# Patient Record
Sex: Female | Born: 1945 | Race: White | Hispanic: No | Marital: Married | Smoking: Never smoker
Health system: Southern US, Community
[De-identification: ages and names within clinical notes are randomized; demographics above are authoritative.]

## PROBLEM LIST (undated history)

## (undated) DIAGNOSIS — E7800 Pure hypercholesterolemia, unspecified: Secondary | ICD-10-CM

## (undated) DIAGNOSIS — E079 Disorder of thyroid, unspecified: Secondary | ICD-10-CM

## (undated) DIAGNOSIS — G249 Dystonia, unspecified: Secondary | ICD-10-CM

## (undated) DIAGNOSIS — I10 Essential (primary) hypertension: Secondary | ICD-10-CM

## (undated) HISTORY — PX: TUBAL LIGATION: SHX77

---

## 2017-03-24 ENCOUNTER — Other Ambulatory Visit (HOSPITAL_COMMUNITY): Payer: Self-pay | Admitting: Interventional Radiology

## 2017-03-24 DIAGNOSIS — S32010A Wedge compression fracture of first lumbar vertebra, initial encounter for closed fracture: Secondary | ICD-10-CM

## 2017-03-26 ENCOUNTER — Other Ambulatory Visit: Payer: Self-pay | Admitting: General Surgery

## 2017-03-26 ENCOUNTER — Other Ambulatory Visit: Payer: Self-pay | Admitting: Physician Assistant

## 2017-04-20 ENCOUNTER — Telehealth: Payer: Self-pay | Admitting: *Deleted

## 2017-04-20 NOTE — Telephone Encounter (Signed)
Ms. Debra Morgan called and cancelled her f/u with Dr. Grace IsaacWatts. She states she is feeling good and doesn't want to come for f/u./vm

## 2017-04-28 ENCOUNTER — Other Ambulatory Visit: Payer: Self-pay

## 2018-11-11 ENCOUNTER — Emergency Department (HOSPITAL_COMMUNITY): Payer: Medicare Other

## 2018-11-11 ENCOUNTER — Emergency Department (HOSPITAL_COMMUNITY)
Admission: EM | Admit: 2018-11-11 | Discharge: 2018-11-11 | Disposition: A | Discharge status: Home / Self Care | Payer: Medicare Other | Attending: Emergency Medicine | Admitting: Emergency Medicine

## 2018-11-11 ENCOUNTER — Encounter (HOSPITAL_COMMUNITY): Payer: Self-pay | Admitting: Emergency Medicine

## 2018-11-11 ENCOUNTER — Other Ambulatory Visit: Payer: Self-pay

## 2018-11-11 DIAGNOSIS — Z20828 Contact with and (suspected) exposure to other viral communicable diseases: Secondary | ICD-10-CM | POA: Diagnosis not present

## 2018-11-11 DIAGNOSIS — I10 Essential (primary) hypertension: Secondary | ICD-10-CM | POA: Insufficient documentation

## 2018-11-11 DIAGNOSIS — Z79899 Other long term (current) drug therapy: Secondary | ICD-10-CM | POA: Diagnosis not present

## 2018-11-11 DIAGNOSIS — G249 Dystonia, unspecified: Secondary | ICD-10-CM | POA: Insufficient documentation

## 2018-11-11 DIAGNOSIS — R05 Cough: Secondary | ICD-10-CM | POA: Diagnosis not present

## 2018-11-11 DIAGNOSIS — E079 Disorder of thyroid, unspecified: Secondary | ICD-10-CM | POA: Insufficient documentation

## 2018-11-11 DIAGNOSIS — R0602 Shortness of breath: Secondary | ICD-10-CM | POA: Insufficient documentation

## 2018-11-11 HISTORY — DX: Disorder of thyroid, unspecified: E07.9

## 2018-11-11 HISTORY — DX: Dystonia, unspecified: G24.9

## 2018-11-11 HISTORY — DX: Essential (primary) hypertension: I10

## 2018-11-11 HISTORY — DX: Pure hypercholesterolemia, unspecified: E78.00

## 2018-11-11 LAB — COMPREHENSIVE METABOLIC PANEL
ALT: 15 U/L (ref 0–44)
AST: 20 U/L (ref 15–41)
Albumin: 3.8 g/dL (ref 3.5–5.0)
Alkaline Phosphatase: 70 U/L (ref 38–126)
Anion gap: 11 (ref 5–15)
BUN: 23 mg/dL (ref 8–23)
CO2: 25 mmol/L (ref 22–32)
Calcium: 9.7 mg/dL (ref 8.9–10.3)
Chloride: 103 mmol/L (ref 98–111)
Creatinine, Ser: 0.97 mg/dL (ref 0.44–1.00)
GFR calc Af Amer: >60 mL/min (ref >60)
GFR calc non Af Amer: 58 mL/min — ABNORMAL LOW (ref >60)
Glucose, Bld: 118 mg/dL — ABNORMAL HIGH (ref 70–99)
Potassium: 3.7 mmol/L (ref 3.5–5.1)
Sodium: 139 mmol/L (ref 135–145)
Total Bilirubin: 1 mg/dL (ref 0.3–1.2)
Total Protein: 6.8 g/dL (ref 6.5–8.1)

## 2018-11-11 LAB — CBC WITH DIFFERENTIAL/PLATELET
Abs Immature Granulocytes: 0.02 10*3/uL (ref 0.00–0.07)
Basophils Absolute: 0 10*3/uL (ref 0.0–0.1)
Basophils Relative: 1 %
Eosinophils Absolute: 0.1 10*3/uL (ref 0.0–0.5)
Eosinophils Relative: 2 %
HCT: 43.4 % (ref 36.0–46.0)
Hemoglobin: 14 g/dL (ref 12.0–15.0)
Immature Granulocytes: 0 %
Lymphocytes Relative: 33 %
Lymphs Abs: 1.9 10*3/uL (ref 0.7–4.0)
MCH: 31.3 pg (ref 26.0–34.0)
MCHC: 32.3 g/dL (ref 30.0–36.0)
MCV: 97.1 fL (ref 80.0–100.0)
Monocytes Absolute: 0.4 10*3/uL (ref 0.1–1.0)
Monocytes Relative: 7 %
Neutro Abs: 3.2 10*3/uL (ref 1.7–7.7)
Neutrophils Relative %: 57 %
Platelets: 277 10*3/uL (ref 150–400)
RBC: 4.47 MIL/uL (ref 3.87–5.11)
RDW: 12.2 % (ref 11.5–15.5)
WBC: 5.6 10*3/uL (ref 4.0–10.5)
nRBC: 0 % (ref 0.0–0.2)

## 2018-11-11 LAB — POCT I-STAT 7, (LYTES, BLD GAS, ICA,H+H)
Acid-Base Excess: 1 mmol/L (ref 0.0–2.0)
Bicarbonate: 25.8 mmol/L (ref 20.0–28.0)
Calcium, Ion: 1.31 mmol/L (ref 1.15–1.40)
HCT: 40 % (ref 36.0–46.0)
Hemoglobin: 13.6 g/dL (ref 12.0–15.0)
O2 Saturation: 96 %
Potassium: 3.4 mmol/L — ABNORMAL LOW (ref 3.5–5.1)
Sodium: 139 mmol/L (ref 135–145)
TCO2: 27 mmol/L (ref 22–32)
pCO2 arterial: 39.8 mmHg (ref 32.0–48.0)
pH, Arterial: 7.42 (ref 7.350–7.450)
pO2, Arterial: 77 mmHg — ABNORMAL LOW (ref 83.0–108.0)

## 2018-11-11 LAB — BRAIN NATRIURETIC PEPTIDE: B Natriuretic Peptide: 26.6 pg/mL (ref 0.0–100.0)

## 2018-11-11 IMAGING — DX PORTABLE CHEST - 1 VIEW
1 series · 1 of 1 positions shown · non-contrast
Comparison: [DATE]

CLINICAL DATA: Body aches, weakness

EXAM:
PORTABLE CHEST 1 VIEW

[chest ap]
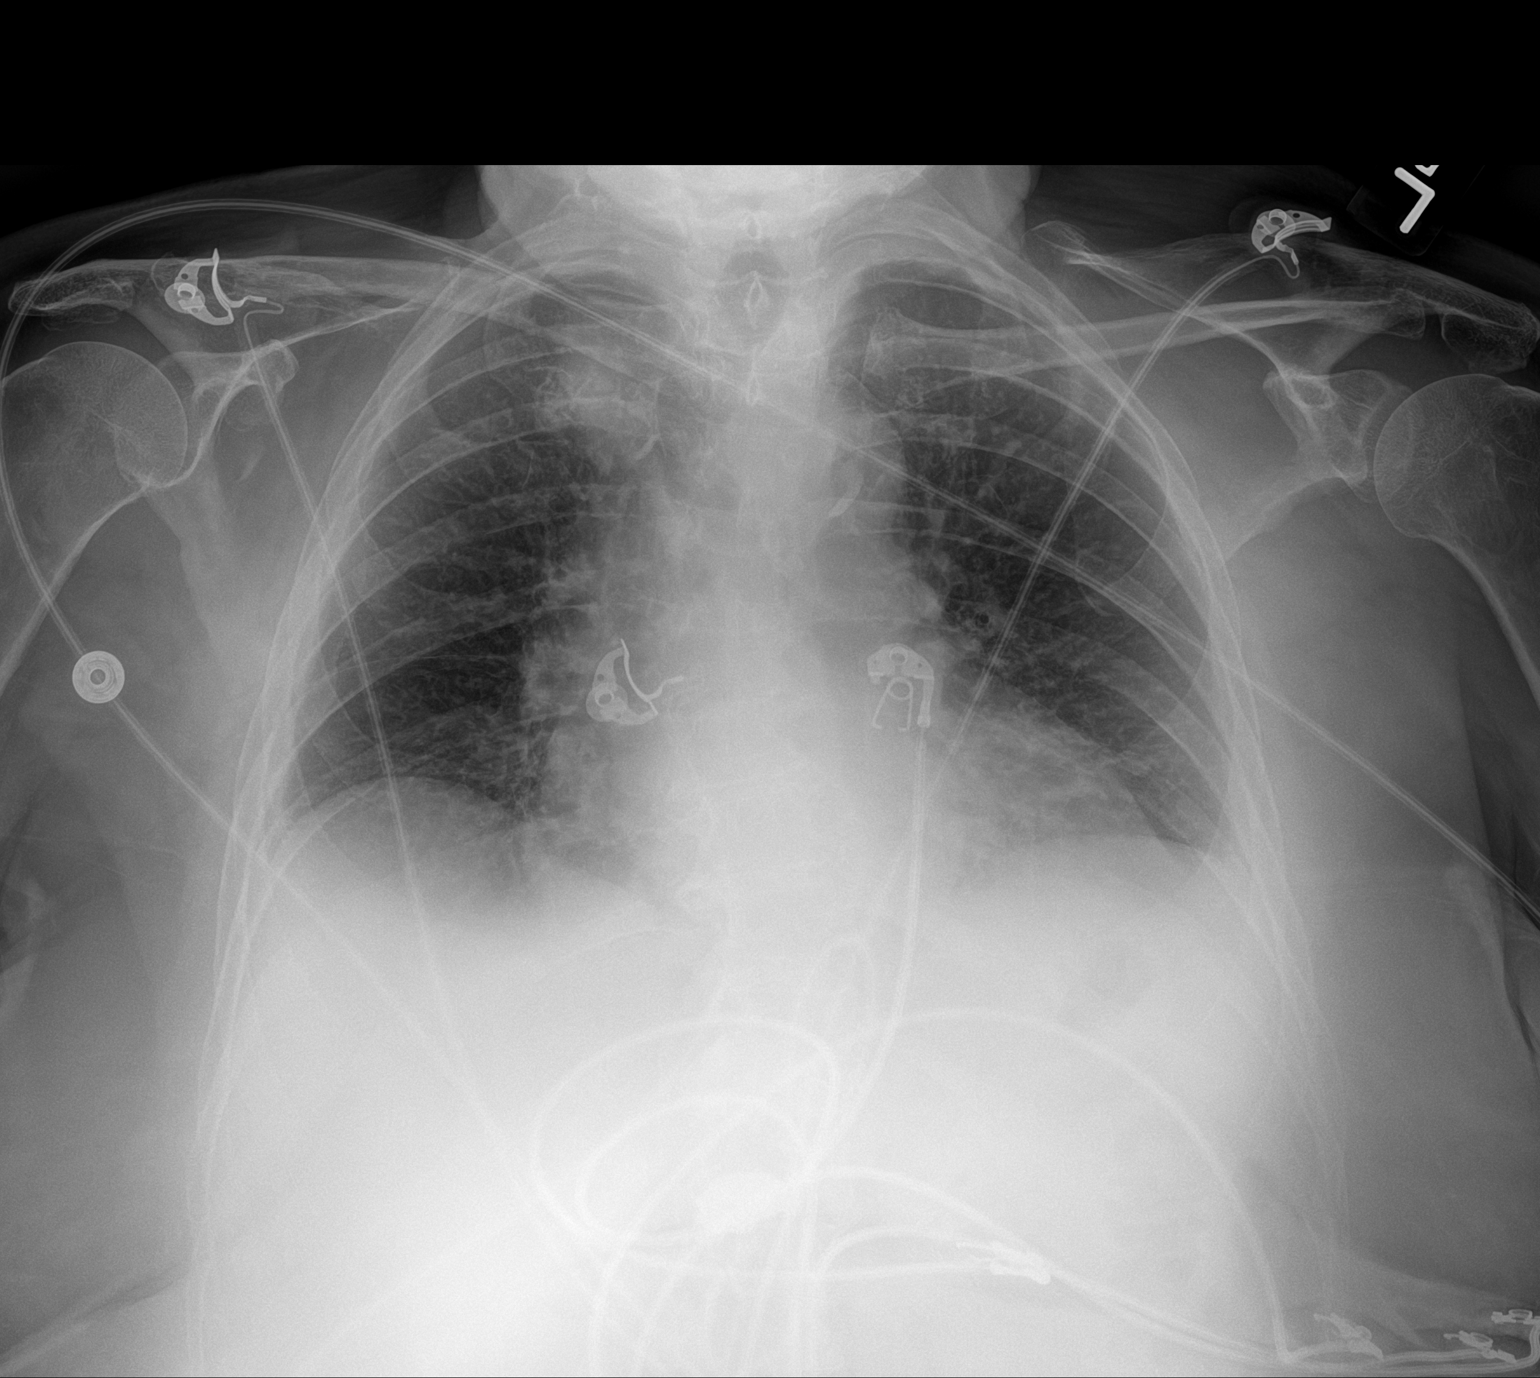

[1 of 1 positions shown; findings below may reference images not displayed]

FINDINGS: Low volume AP portable examination with minimal diffuse interstitial
pulmonary opacity, which may reflect minimal edema, this appearance
likely exaggerated by bronchovascular crowding and low volume
technique. There is no definite or focal airspace opacity. Unchanged
cardiomegaly.
IMPRESSION: Low volume AP portable examination with minimal diffuse interstitial
pulmonary opacity, which may reflect minimal edema, this appearance
likely exaggerated by bronchovascular crowding and low volume
technique. There is no definite or focal airspace opacity. Unchanged
cardiomegaly.

## 2018-11-11 MED ORDER — HYDROXYZINE HCL 10 MG PO TABS: 10.0000 mg | ORAL_TABLET | Freq: Three times a day (TID) | ORAL | 0 refills | Status: DC | PRN

## 2018-11-11 NOTE — ED Provider Notes (Signed)
[facility] EMERGENCY DEPARTMENT Provider Note   CSN: [id number] Arrival date & time: 11/11/18  7619    History   Chief Complaint Chief Complaint  Patient presents with  . Shortness of Breath    HPI Debra Morgan is a 73 y.o. female.     The history is provided by the patient, medical records, the EMS personnel and the spouse. No language interpreter was used.  Shortness of Breath  Debra Morgan is a 73 y.o. female who presents to the Emergency Department complaining of SOB. Since the emergency department complaining of 2 1/2 weeks of progressive shortness of breath. She did have a trip to [facility] where she owns a home a few weeks ago. After returning from the trip she did have a COVID test that was negative. She complains of constant shortness of breath, which is worse with exertion. She has a cough that is productive of yellow sputum occasionally. She has [place]CP [place] She is recently had CT scan and labs performed that showed no acute changes. She presents to the ED today by EMS due to persistent shortness of breath, fatigue and malaise. 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 She transfers to a bedside commode. She denies any fevers, chest pain, abdominal pain, vomiting, diarrhea. Symptoms are moderate, constant, worsening. Past Medical History:  Diagnosis Date  . Dystonia   . High cholesterol   . Hypertension   . Thyroid disease     There are no active problems to display for this patient.      OB History   No obstetric history on file.      Home Medications    Prior to Admission medications   Medication Sig Start Date End Date Taking? Authorizing Provider  acetaminophen (TYLENOL) 500 MG tablet Take 1,000 mg by mouth every 6 (six) hours as needed for mild pain or headache.   Yes [provider]  candesartan (ATACAND) 4 MG tablet Take 4 mg by mouth daily. 08/26/18  Yes [provider]  carvedilol (COREG) 6.25 MG tablet Take 6.25 mg by mouth 2 (two) times a day. 09/13/18  Yes [provider]  doxycycline (VIBRAMYCIN) 100 MG capsule Take 100 mg by mouth 2 (two) times a day. 11/05/18  Yes [provider]  escitalopram (LEXAPRO) 10 MG tablet Take 10 mg by mouth daily. 08/24/18  Yes [provider]  ezetimibe (ZETIA) 10 MG tablet Take 10 mg by mouth at bedtime. 09/21/18  Yes [provider]  levothyroxine (SYNTHROID) 75 MCG tablet Take 75 mcg by mouth daily. 08/10/18  Yes [provider]  triamterene-hydrochlorothiazide (MAXZIDE-25) 37.5-25 MG tablet Take 1 tablet by mouth daily. 09/13/18  Yes [provider]  hydrOXYzine (ATARAX/VISTARIL) 10 MG tablet Take 1 tablet (10 mg total) by mouth every 8 (eight) hours as needed for anxiety. 11/11/18   Quintella Reichert, MD  LIVALO 2 MG TABS Take 2 mg by mouth daily. 05/20/18   [provider]    Family History No family history on file.  Social History Social History   Tobacco Use  . Smoking status: Never Smoker  . Smokeless tobacco: Never Used  Substance Use Topics  . Alcohol use: Yes    Comment: social drinker, infrequent  . Drug use: Never     Allergies   Sinemet [carbidopa w-levodopa], Codeine, and Tetracyclines & related   Review of Systems Review of Systems  Respiratory: Positive  for shortness of breath.   All other systems reviewed and are negative.    Physical Exam Updated Vital Signs BP 135/85 (BP Location: Right Arm)   Pulse 64   Temp 98.7 F (37.1 C) (Oral)   Resp 18   Ht 5\' 5"  (1.651 m)   Wt 76.2 kg   SpO2 98%   BMI 27.96 kg/m   Physical Exam Vitals signs and nursing note reviewed.  Constitutional:      Appearance: She is well-developed.  HENT:     Head: Normocephalic and atraumatic.  Cardiovascular:     Rate and Rhythm: Normal rate and regular rhythm.     Heart sounds: No murmur.  Pulmonary:     Effort: Pulmonary effort is  normal. No respiratory distress.     Breath sounds: Normal breath sounds.  Abdominal:     Palpations: Abdomen is soft.     Tenderness: There is no abdominal tenderness. There is no guarding or rebound.  Musculoskeletal:        General: No tenderness.  Skin:    General: Skin is warm and dry.  Neurological:     Mental Status: She is alert and oriented to person, place, and time.     Comments: Five out of five strength in lateral upper extremities. No asymmetry of facial movement. 4+ out of five strength in the right lower extremity, which patient states this chronic. Five out of five strength in the left lower extremity. Sensation to light touch intact in all four extremities.  Psychiatric:        Mood and Affect: Mood normal.        Behavior: Behavior normal.      ED Treatments / Results  Labs (all labs ordered are listed, but only abnormal results are displayed) Labs Reviewed  COMPREHENSIVE METABOLIC PANEL - Abnormal; Notable for the following components:      Result Value   Glucose, Bld 118 (*)    GFR calc non Af Amer 58 (*)    All other components within normal limits  NOVEL CORONAVIRUS, NAA (HOSPITAL ORDER, SEND-OUT TO REF LAB)  CBC WITH DIFFERENTIAL/PLATELET  BRAIN NATRIURETIC PEPTIDE  I-STAT ARTERIAL BLOOD GAS, ED    EKG EKG Interpretation  Date/Time:  Thursday November 11 2018 09:15:28 EDT Ventricular Rate:  72 PR Interval:    QRS Duration: 77 QT Interval:  369 QTC Calculation: 404 R Axis:   15 Text Interpretation:  Sinus rhythm Low voltage, precordial leads no prior available for comparison Confirmed by Tilden Fossaees, Lainie Daubert [phone]([id number]) on 11/11/2018 9:34:11 AM   Radiology Dg Chest Port 1 View  Result Date: 11/11/2018 CLINICAL DATA:  Body aches, weakness EXAM: PORTABLE CHEST 1 VIEW COMPARISON:  11/10/2018 FINDINGS: Low volume AP portable examination with minimal diffuse interstitial pulmonary opacity, which may reflect minimal edema, this appearance likely exaggerated by  bronchovascular crowding and low volume technique. There is no definite or focal airspace opacity. Unchanged cardiomegaly. IMPRESSION: Low volume AP portable examination with minimal diffuse interstitial pulmonary opacity, which may reflect minimal edema, this appearance likely exaggerated by bronchovascular crowding and low volume technique. There is no definite or focal airspace opacity. Unchanged cardiomegaly. Electronically Signed   By: Lauralyn PrimesAlex  Bibbey M.D.   On: 11/11/2018 10:15    Procedures Procedures (including critical care time)  Medications Ordered in ED Medications - No data to display   Initial Impression / Assessment and Plan / ED Course  I have reviewed the triage vital signs and the nursing notes.  Pertinent  labs & imaging results that were available during my care of the patient were reviewed by me and considered in my medical decision making (see chart for details).        Patient with history of dystonia here for evaluation of progressive shortness of breath. She is not ambulatory at baseline. She has no respiratory distress on examination, good air movement bilaterally. She is recently taken to seven days of doxycycline with no significant improvement in her symptoms. Reviewed records in care everywhere. She has recently had workup with negative troponins, CTA chest. Presentation is not consistent with ACS, PE, acute CHF, pneumonia, reactive airway disease, myasthenic crisis. Discussed with patient unclear source of dyspnea, question some element of anxiety. Discussed importance of PCP as well as neurology follow-up. Given her recent travel [place] [facility] [place] will send repeat COVID swab. Discussed with patient and husband home care for anxiety as well as outpatient follow-up and return precautions. Final Clinical Impressions(s) / ED Diagnoses   Final diagnoses:  Shortness of breath    ED Discharge Orders         Ordered    hydrOXYzine (ATARAX/VISTARIL) 10 MG tablet   Every 8 hours PRN     11/11/18 1207           Tilden Fossaees, Evalyne Cortopassi, MD 11/11/18 1216

## 2018-11-11 NOTE — Progress Notes (Signed)
ABG on room air is as follows:  PH 7.42  PCO2 40  PO2 77  HCO3- 26.    No critical values noted.  Results reported to Dr. Ralene Bathe as POC is down at this time.

## 2018-11-11 NOTE — ED Triage Notes (Signed)
Pt here for evaluation of ongoing constant shortness of breath x 2.5 weeks.Recent travel to [facility], negative covid test on 6/16. Endorses productive cough x 2.5 weeks and generalized weakness.

## 2018-11-11 NOTE — Progress Notes (Signed)
NIF maneuver performed with NIF > 40 cmH20 with good effort and understanding by the patient.

## 2018-11-11 NOTE — ED Notes (Signed)
Patient verbalizes understanding of discharge instructions. Opportunity for questioning and answers were provided. Armband removed by staff, pt discharged from ED via wheelchair to home.  

## 2018-11-12 LAB — NOVEL CORONAVIRUS, NAA (HOSP ORDER, SEND-OUT TO REF LAB; TAT 18-24 HRS): SARS-CoV-2, NAA: NOT DETECTED

## 2019-03-16 ENCOUNTER — Encounter (HOSPITAL_COMMUNITY): Payer: Self-pay | Admitting: Family Medicine

## 2019-03-16 ENCOUNTER — Emergency Department (HOSPITAL_COMMUNITY): Payer: Medicare Other

## 2019-03-16 ENCOUNTER — Other Ambulatory Visit: Payer: Self-pay

## 2019-03-16 ENCOUNTER — Inpatient Hospital Stay (HOSPITAL_COMMUNITY)
Admission: EM | Admit: 2019-03-16 | Discharge: 2019-03-26 | DRG: 177 | Disposition: A | Discharge status: Home Health | Payer: Medicare Other | Attending: Internal Medicine | Admitting: Internal Medicine

## 2019-03-16 DIAGNOSIS — Z9851 Tubal ligation status: Secondary | ICD-10-CM

## 2019-03-16 DIAGNOSIS — R630 Anorexia: Secondary | ICD-10-CM | POA: Diagnosis present

## 2019-03-16 DIAGNOSIS — Z66 Do not resuscitate: Secondary | ICD-10-CM | POA: Diagnosis not present

## 2019-03-16 DIAGNOSIS — Z888 Allergy status to other drugs, medicaments and biological substances status: Secondary | ICD-10-CM

## 2019-03-16 DIAGNOSIS — Z792 Long term (current) use of antibiotics: Secondary | ICD-10-CM

## 2019-03-16 DIAGNOSIS — G9341 Metabolic encephalopathy: Secondary | ICD-10-CM | POA: Diagnosis not present

## 2019-03-16 DIAGNOSIS — I700 Atherosclerosis of aorta: Secondary | ICD-10-CM | POA: Diagnosis present

## 2019-03-16 DIAGNOSIS — R0602 Shortness of breath: Secondary | ICD-10-CM

## 2019-03-16 DIAGNOSIS — Z79899 Other long term (current) drug therapy: Secondary | ICD-10-CM

## 2019-03-16 DIAGNOSIS — Z6828 Body mass index (BMI) 28.0-28.9, adult: Secondary | ICD-10-CM

## 2019-03-16 DIAGNOSIS — G249 Dystonia, unspecified: Secondary | ICD-10-CM | POA: Diagnosis present

## 2019-03-16 DIAGNOSIS — F331 Major depressive disorder, recurrent, moderate: Secondary | ICD-10-CM | POA: Diagnosis present

## 2019-03-16 DIAGNOSIS — U071 COVID-19: Secondary | ICD-10-CM | POA: Diagnosis not present

## 2019-03-16 DIAGNOSIS — R531 Weakness: Secondary | ICD-10-CM | POA: Diagnosis not present

## 2019-03-16 DIAGNOSIS — Z885 Allergy status to narcotic agent status: Secondary | ICD-10-CM

## 2019-03-16 DIAGNOSIS — Z881 Allergy status to other antibiotic agents status: Secondary | ICD-10-CM

## 2019-03-16 DIAGNOSIS — R079 Chest pain, unspecified: Secondary | ICD-10-CM | POA: Diagnosis not present

## 2019-03-16 DIAGNOSIS — R7401 Elevation of levels of liver transaminase levels: Secondary | ICD-10-CM | POA: Diagnosis present

## 2019-03-16 DIAGNOSIS — J1289 Other viral pneumonia: Secondary | ICD-10-CM

## 2019-03-16 DIAGNOSIS — R339 Retention of urine, unspecified: Secondary | ICD-10-CM | POA: Diagnosis not present

## 2019-03-16 DIAGNOSIS — R45851 Suicidal ideations: Secondary | ICD-10-CM | POA: Diagnosis not present

## 2019-03-16 DIAGNOSIS — Z79890 Hormone replacement therapy (postmenopausal): Secondary | ICD-10-CM

## 2019-03-16 DIAGNOSIS — E039 Hypothyroidism, unspecified: Secondary | ICD-10-CM | POA: Diagnosis present

## 2019-03-16 DIAGNOSIS — I10 Essential (primary) hypertension: Secondary | ICD-10-CM | POA: Diagnosis present

## 2019-03-16 DIAGNOSIS — Z7401 Bed confinement status: Secondary | ICD-10-CM

## 2019-03-16 DIAGNOSIS — F419 Anxiety disorder, unspecified: Secondary | ICD-10-CM | POA: Diagnosis present

## 2019-03-16 DIAGNOSIS — R001 Bradycardia, unspecified: Secondary | ICD-10-CM | POA: Diagnosis present

## 2019-03-16 DIAGNOSIS — E785 Hyperlipidemia, unspecified: Secondary | ICD-10-CM | POA: Diagnosis present

## 2019-03-16 DIAGNOSIS — Z993 Dependence on wheelchair: Secondary | ICD-10-CM

## 2019-03-16 DIAGNOSIS — E079 Disorder of thyroid, unspecified: Secondary | ICD-10-CM | POA: Diagnosis present

## 2019-03-16 DIAGNOSIS — N390 Urinary tract infection, site not specified: Secondary | ICD-10-CM | POA: Diagnosis not present

## 2019-03-16 DIAGNOSIS — R42 Dizziness and giddiness: Secondary | ICD-10-CM | POA: Diagnosis present

## 2019-03-16 DIAGNOSIS — J1282 Pneumonia due to coronavirus disease 2019: Secondary | ICD-10-CM

## 2019-03-16 DIAGNOSIS — G609 Hereditary and idiopathic neuropathy, unspecified: Secondary | ICD-10-CM | POA: Diagnosis present

## 2019-03-16 DIAGNOSIS — R4182 Altered mental status, unspecified: Secondary | ICD-10-CM

## 2019-03-16 LAB — CBC WITH DIFFERENTIAL/PLATELET
Abs Immature Granulocytes: 0.13 10*3/uL — ABNORMAL HIGH (ref 0.00–0.07)
Basophils Absolute: 0 10*3/uL (ref 0.0–0.1)
Basophils Relative: 0 %
Eosinophils Absolute: 0 10*3/uL (ref 0.0–0.5)
Eosinophils Relative: 0 %
HCT: 43.1 % (ref 36.0–46.0)
Hemoglobin: 15 g/dL (ref 12.0–15.0)
Immature Granulocytes: 2 %
Lymphocytes Relative: 19 %
Lymphs Abs: 1.5 10*3/uL (ref 0.7–4.0)
MCH: 34.3 pg — ABNORMAL HIGH (ref 26.0–34.0)
MCHC: 34.8 g/dL (ref 30.0–36.0)
MCV: 98.6 fL (ref 80.0–100.0)
Monocytes Absolute: 0.5 10*3/uL (ref 0.1–1.0)
Monocytes Relative: 7 %
Neutro Abs: 5.7 10*3/uL (ref 1.7–7.7)
Neutrophils Relative %: 72 %
Platelets: 351 10*3/uL (ref 150–400)
RBC: 4.37 MIL/uL (ref 3.87–5.11)
RDW: 13 % (ref 11.5–15.5)
WBC: 7.9 10*3/uL (ref 4.0–10.5)
nRBC: 0 % (ref 0.0–0.2)

## 2019-03-16 LAB — LACTIC ACID, PLASMA
Lactic Acid, Venous: 1.8 mmol/L (ref 0.5–1.9)
Lactic Acid, Venous: 1.7 mmol/L (ref 0.5–1.9)

## 2019-03-16 LAB — COMPREHENSIVE METABOLIC PANEL
ALT: 54 U/L — ABNORMAL HIGH (ref 0–44)
AST: 46 U/L — ABNORMAL HIGH (ref 15–41)
Albumin: 3.5 g/dL (ref 3.5–5.0)
Alkaline Phosphatase: 77 U/L (ref 38–126)
Anion gap: 12 (ref 5–15)
BUN: 34 mg/dL — ABNORMAL HIGH (ref 8–23)
CO2: 22 mmol/L (ref 22–32)
Calcium: 9.7 mg/dL (ref 8.9–10.3)
Chloride: 103 mmol/L (ref 98–111)
Creatinine, Ser: 0.98 mg/dL (ref 0.44–1.00)
GFR calc Af Amer: >60 mL/min (ref >60)
GFR calc non Af Amer: 57 mL/min — ABNORMAL LOW (ref >60)
Glucose, Bld: 123 mg/dL — ABNORMAL HIGH (ref 70–99)
Potassium: 4.3 mmol/L (ref 3.5–5.1)
Sodium: 137 mmol/L (ref 135–145)
Total Bilirubin: 0.3 mg/dL (ref 0.3–1.2)
Total Protein: 7.2 g/dL (ref 6.5–8.1)

## 2019-03-16 LAB — CK: Total CK: 76 U/L (ref 38–234)

## 2019-03-16 LAB — TROPONIN I (HIGH SENSITIVITY): Troponin I (High Sensitivity): 11 ng/L (ref <18)

## 2019-03-16 IMAGING — DX DG CHEST 1V PORT
1 series · 1 of 1 positions shown · non-contrast
Comparison: Prior radiograph from [DATE].

CLINICAL DATA: Initial evaluation for acute cough, weakness, OB
positive.

EXAM:
PORTABLE CHEST 1 VIEW

[chest]
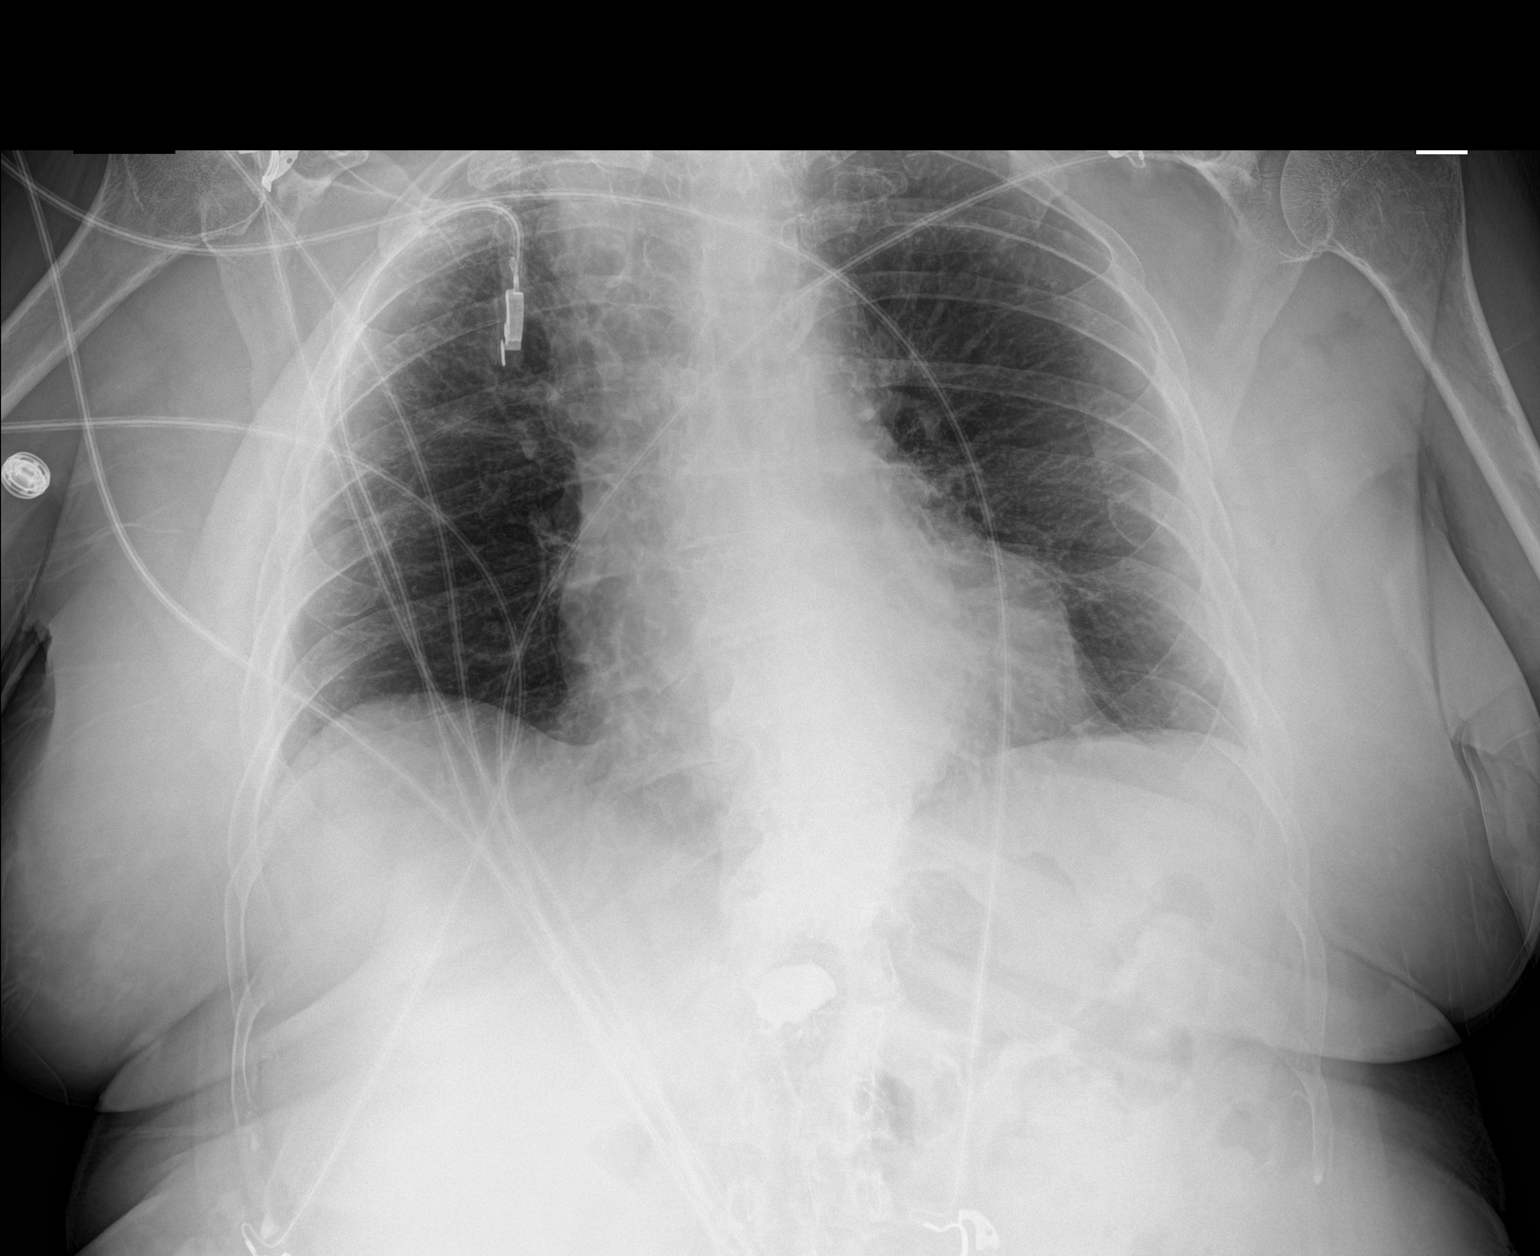

[1 of 1 positions shown; findings below may reference images not displayed]

FINDINGS: Cardiac and mediastinal silhouettes within normal limits.

Lungs mildly hypoinflated. Linear atelectatic changes noted within
the right perihilar region, lingula, and left lung base. No other
focal airspace disease. No edema or effusion. No pneumothorax.

No acute osseous finding.
IMPRESSION: 1. Mild scattered subsegmental atelectasis within the right
perihilar region and left lung base.
2. No other radiographic evidence for active cardiopulmonary
disease.

## 2019-03-16 IMAGING — CT CT ANGIO CHEST
2 of 6 series · 17 of 46 positions shown · IV contrast (omnipaque)
Comparison: CTA [DATE]

CLINICAL DATA: Shortness of breath, [KU] positive.

EXAM:
CT ANGIOGRAPHY CHEST WITH CONTRAST
TECHNIQUE: Multidetector CT imaging of the chest was performed using the
standard protocol during bolus administration of intravenous
contrast. Multiplanar CT image reconstructions and MIPs were
obtained to evaluate the vascular anatomy.
CONTRAST:  75mL OMNIPAQUE IOHEXOL 350 MG/ML SOLN

[Series 6: thins · axial · 0.77mm/px · z∈[+1079,+1306]mm · 14 of 249 slices shown]
[im 11/249  lung]
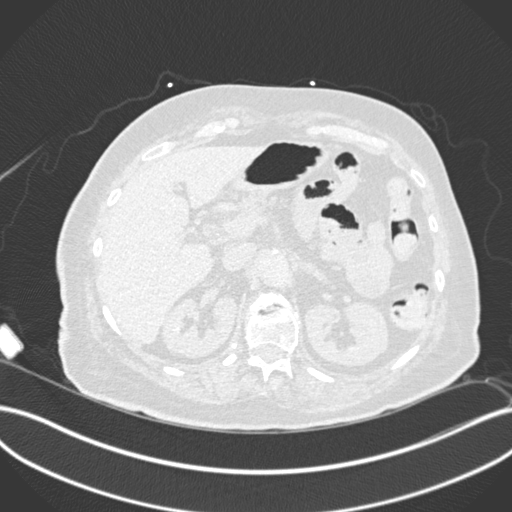
[im 33/249  soft-tissue]
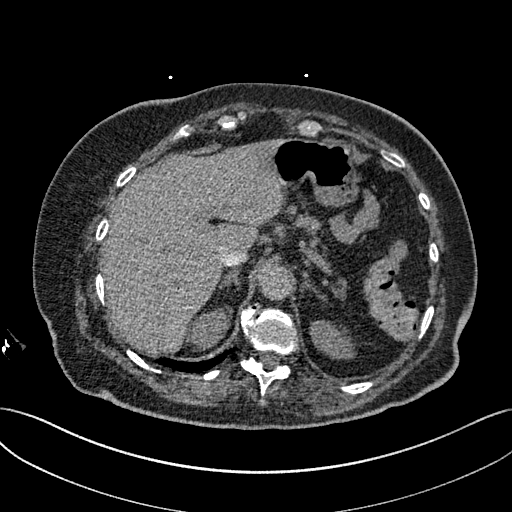
[im 44/249  lung]
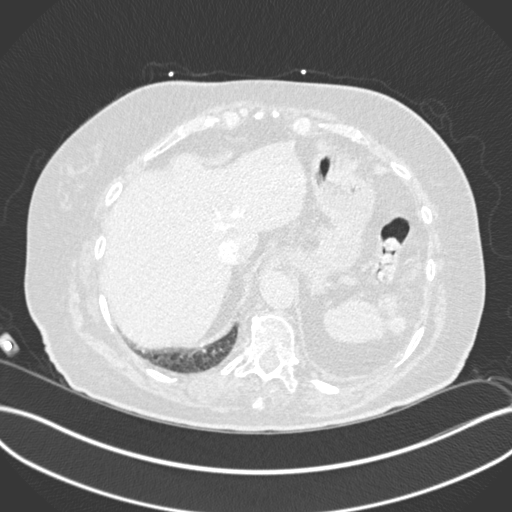
[im 65/249  soft-tissue]
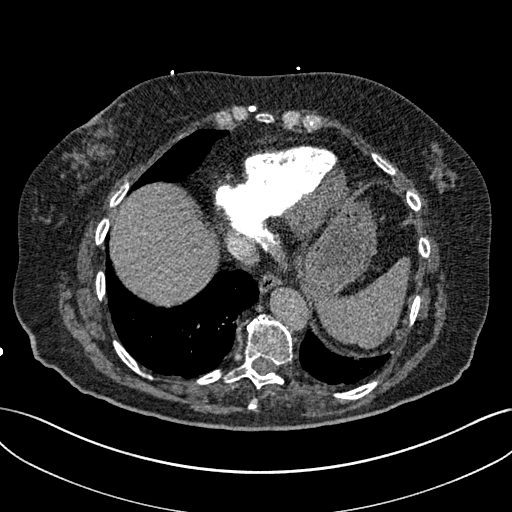
[im 87/249  lung]
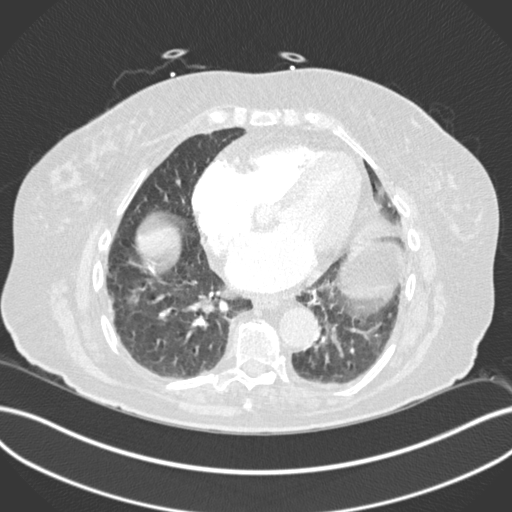
[im 98/249  soft-tissue]
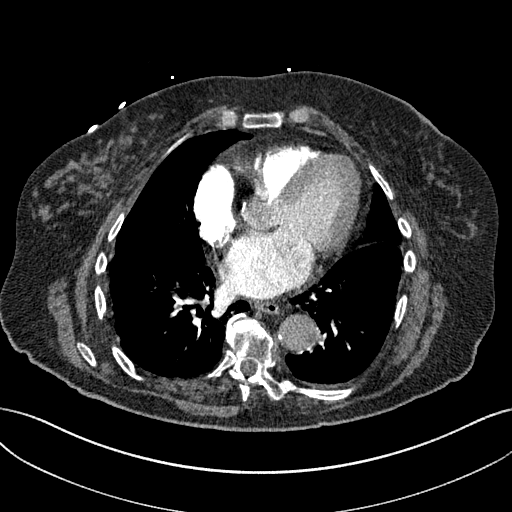
[im 119/249  lung]
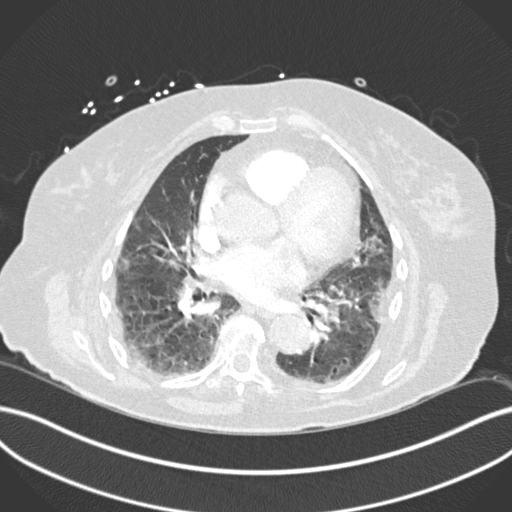
[im 130/249  soft-tissue]
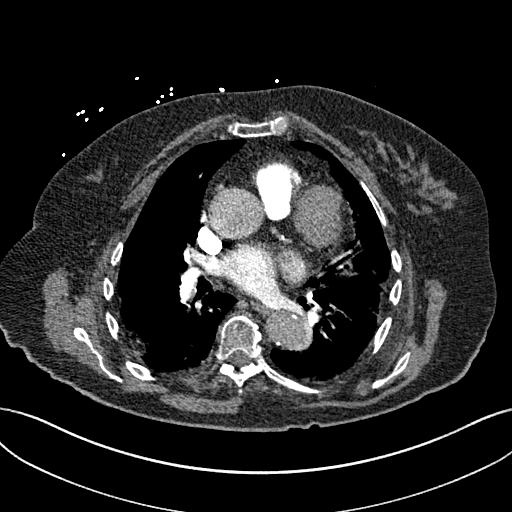
[im 151/249  lung]
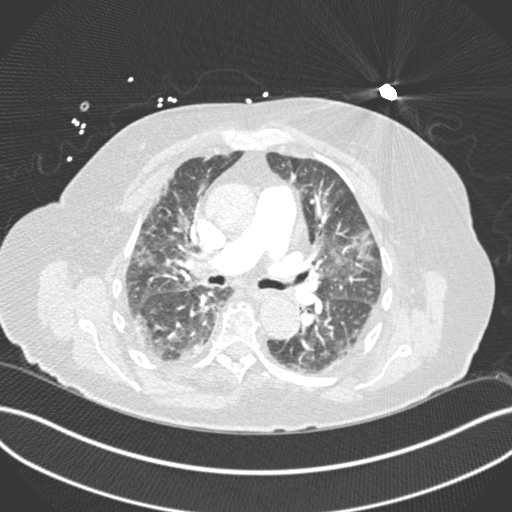
[im 162/249  soft-tissue]
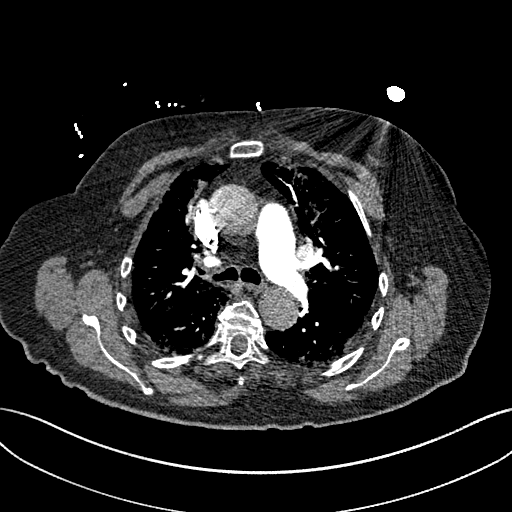
[im 184/249  lung]
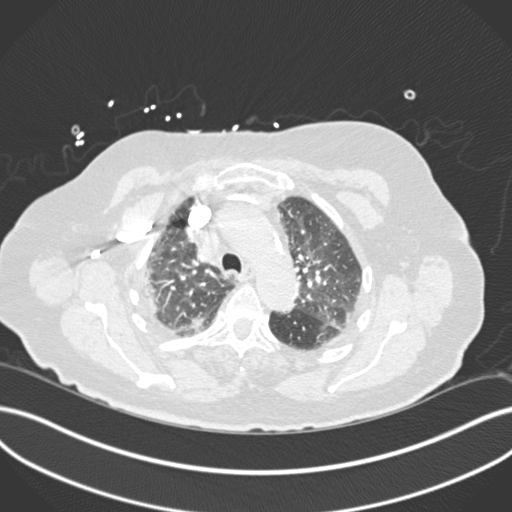
[im 205/249  soft-tissue]
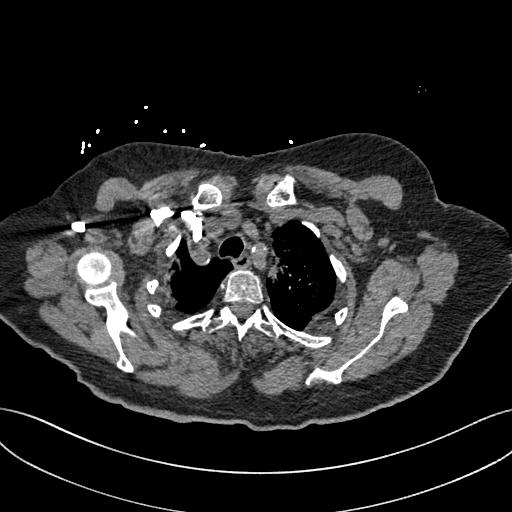
[im 216/249  lung]
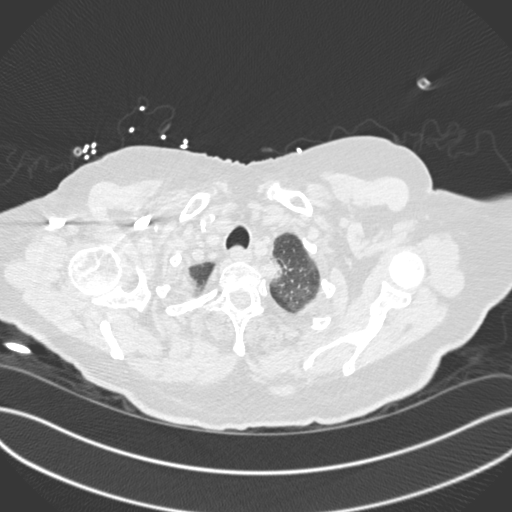
[im 238/249  soft-tissue]
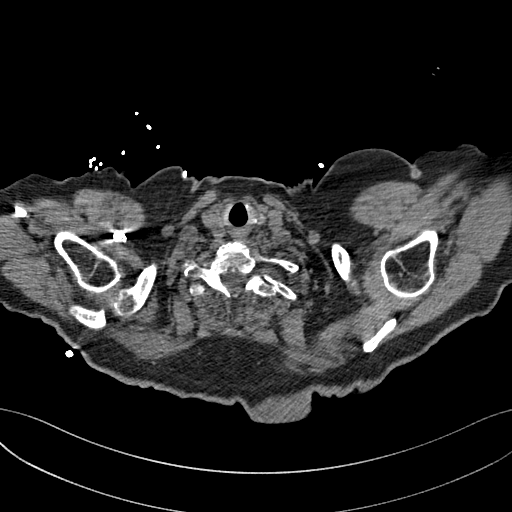

[Series 8: coronal mpr · coronal · 0.49mm/px · 3 of 151 slices shown]
[im 38/151  soft-tissue]
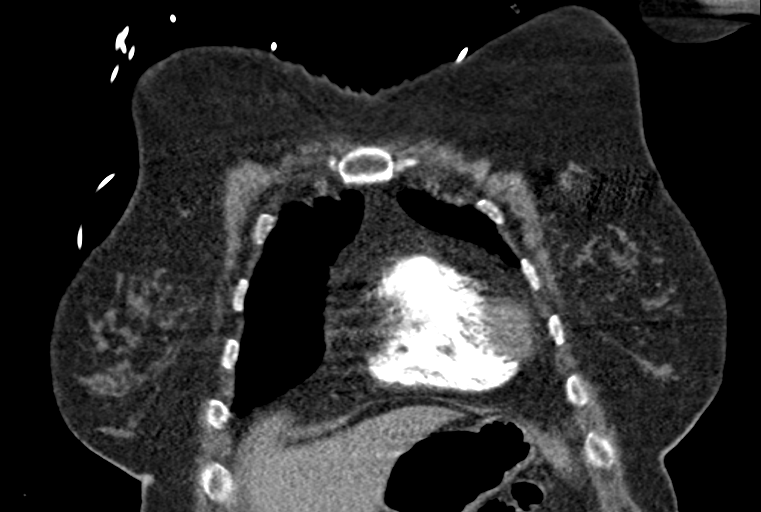
[im 76/151  soft-tissue]
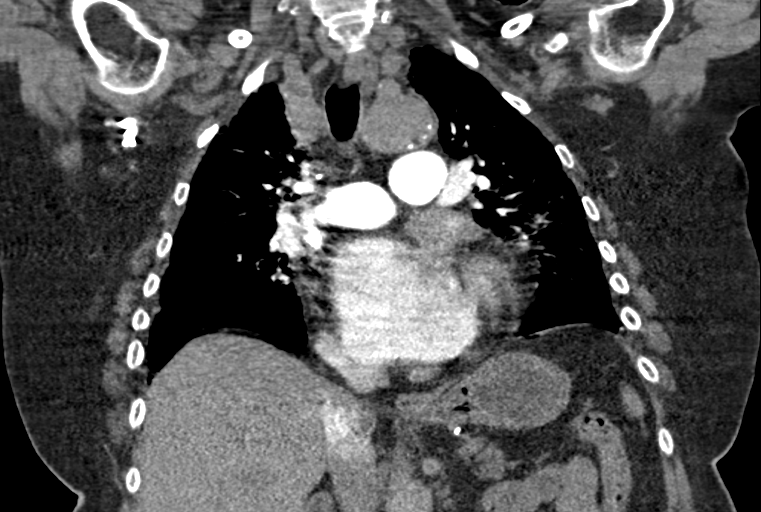
[im 113/151  soft-tissue]
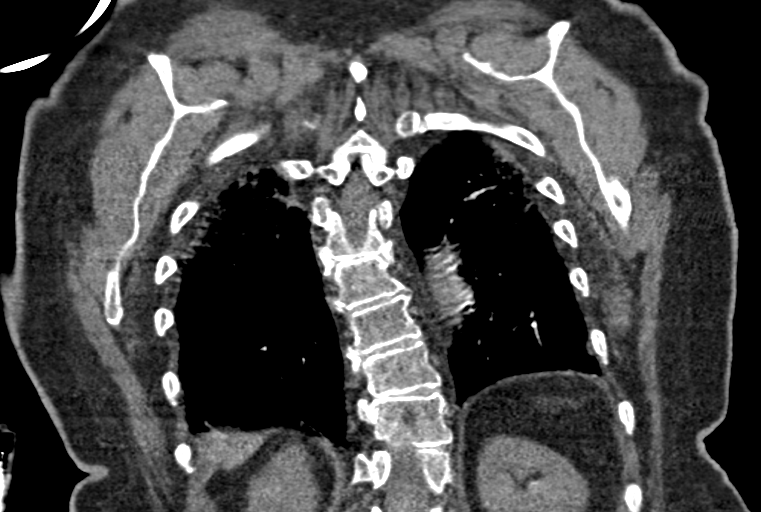

[17 of 46 positions shown; findings below may reference images not displayed]

FINDINGS: Cardiovascular: Satisfactory opacification of the pulmonary arteries
to the lobar level. More distal evaluation limited by respiratory
motion and underlying parenchymal disease. No pulmonary artery
filling defects are seen to the lobar level. Pulmonary arterial
caliber is upper limits normal, unchanged from comparison.
Atherosclerotic plaque of the nonaneurysmal thoracic aorta. Normal 3
vessel branching of the arch with scant calcification in the
proximal great vessels. Mild cardiomegaly with biatrial enlargement.
No pericardial effusion. No elevation of the RV/LV ratio (0.75).

Mediastinum/Nodes: No enlarged mediastinal, hilar or axillary lymph
nodes. Thyroid gland, trachea, and esophagus demonstrate no
significant findings.

Lungs/Pleura: Multifocal areas peripheral predominant ground-glass
are present in the lungs. No pneumothorax or effusion. Mild septal
thickening noted in the apices and bases. Diffuse airways thickening
and scattered secretions.

Upper Abdomen: Non rotation of both kidneys, anatomic variant.
Partial fatty replacement of the pancreas. Slight reflux of contrast
into the hepatic veins.

Musculoskeletal: Multilevel degenerative changes are present in the
imaged portions of the spine. No acute osseous abnormality or
suspicious osseous lesion. No concerning chest wall lesions S-shaped
curvature of the thoracolumbar spine vertebroplasty changes at L1,
partially visualized.

Review of the MIP images confirms the above findings.
IMPRESSION: 1. No evidence of pulmonary embolism to the lobar level. More distal
evaluation limited by respiratory motion and underlying parenchymal
disease.
2. Multifocal areas of peripheral predominant ground-glass in the
lungs, compatible with atypical infection in the setting of known
[KU] positivity.
3. Some septal thickening in the apices may reflect superimposed
edema.
4. Aortic Atherosclerosis ([KU]-[KU]).

## 2019-03-16 MED ORDER — IOHEXOL 350 MG/ML SOLN
75.0000 mL | Freq: Once | INTRAVENOUS | Status: AC | PRN
  Administered 2019-03-16: 75 mL via INTRAVENOUS

## 2019-03-16 MED ORDER — ONDANSETRON HCL 4 MG PO TABS: 4.0000 mg | ORAL_TABLET | Freq: Four times a day (QID) | ORAL | Status: DC | PRN

## 2019-03-16 MED ORDER — CARVEDILOL 6.25 MG PO TABS
6.2500 mg | ORAL_TABLET | Freq: Two times a day (BID) | ORAL | Status: DC
  Filled 2019-03-16 (×2): qty 1

## 2019-03-16 MED ORDER — LEVOTHYROXINE SODIUM 75 MCG PO TABS
75.0000 ug | ORAL_TABLET | Freq: Every day | ORAL | Status: DC
  Administered 2019-03-17 – 2019-03-26 (×10): 75 ug via ORAL
  Filled 2019-03-16 (×8): qty 1

## 2019-03-16 MED ORDER — ACETAMINOPHEN 325 MG PO TABS
650.0000 mg | ORAL_TABLET | Freq: Four times a day (QID) | ORAL | Status: DC | PRN
  Administered 2019-03-19 – 2019-03-25 (×4): 650 mg via ORAL
  Filled 2019-03-16 (×4): qty 2

## 2019-03-16 MED ORDER — IRBESARTAN 75 MG PO TABS
37.5000 mg | ORAL_TABLET | Freq: Every day | ORAL | Status: DC
  Administered 2019-03-17 – 2019-03-26 (×10): 37.5 mg via ORAL
  Filled 2019-03-16 (×11): qty 0.5

## 2019-03-16 MED ORDER — SODIUM CHLORIDE 0.9 % IV BOLUS
1000.0000 mL | Freq: Once | INTRAVENOUS | Status: AC
  Administered 2019-03-16: 1000 mL via INTRAVENOUS

## 2019-03-16 MED ORDER — BENZTROPINE MESYLATE 0.5 MG PO TABS
1.0000 mg | ORAL_TABLET | Freq: Two times a day (BID) | ORAL | Status: DC
  Administered 2019-03-17 – 2019-03-25 (×17): 1 mg via ORAL
  Filled 2019-03-16: qty 1
  Filled 2019-03-16 (×5): qty 2
  Filled 2019-03-16 (×2): qty 1
  Filled 2019-03-16 (×2): qty 2
  Filled 2019-03-16: qty 1
  Filled 2019-03-16 (×3): qty 2
  Filled 2019-03-16 (×2): qty 1
  Filled 2019-03-16 (×3): qty 2
  Filled 2019-03-16: qty 1
  Filled 2019-03-16: qty 2

## 2019-03-16 MED ORDER — ONDANSETRON HCL 4 MG/2ML IJ SOLN: 4.0000 mg | Freq: Four times a day (QID) | INTRAMUSCULAR | Status: DC | PRN

## 2019-03-16 NOTE — ED Notes (Signed)
Daughter- [name] hicks [phone]) would like an update

## 2019-03-16 NOTE — ED Notes (Signed)
Please call Daughter [name] back when results come back for scan [phone]

## 2019-03-16 NOTE — ED Notes (Signed)
[name]  Daughter [phone]

## 2019-03-16 NOTE — ED Triage Notes (Signed)
Pt has had covid for 2 weeks with cough, weakness, chills. Also had pneumonia and was admitted to [facility] earlier this week and d/c after receiving antibiotics. Pt states that weakness is worse and she cannot get up on her own. VSS. Axox4.

## 2019-03-16 NOTE — ED Notes (Signed)
Spoke with pts daughter to update her that pt was in room in the ED. Pt daughter sts that the pt is followed by a movement neurologist at [facility] in [place], [place] as well as a neurologist at [facility]. Pt daughter also sts that she can be contacted at any time if needed. Pt daughters name is [name] and is in a note in chart from [employer].

## 2019-03-16 NOTE — ED Notes (Signed)
Unable to check pulse ox while ambulating per order d/t pt is wheelchair bound.

## 2019-03-16 NOTE — ED Provider Notes (Signed)
[facility] EMERGENCY DEPARTMENT Provider Note   CSN: [id number] Arrival date & time: 03/16/19  1033     History   Chief Complaint Chief Complaint  Patient presents with   Covid +   Weakness    HPI Wendelyn Kiesling is a 73 y.o. female.     73 y.o female with a PMH of HTN, thyroid disease presents to the ED with a chief complaint of shortness of breath. Patient was originally diagnosed with Covid 19 on 03/04/2019, we was admitted to the hospital and discharged on 03/13/2019. She received azithromycin IV while in the hospital, she was also discharged with a prescription for prednisone. She also endorses body aches, dry cough and overall myalgias. She states she has not been able to walk as she constantly feels like she is "gasping for air".  She also endorses dizziness, states this makes ambulating very difficult. She reports a subjective fever. Patient reports she was admitted to the hospital last time due to hallucinations and Covid 19 infection. She denies any diarrhea, vomiting, chest pain.   The history is provided by the patient and medical records.  Weakness Associated symptoms: cough, dizziness, fever and shortness of breath   Associated symptoms: no abdominal pain, no chest pain, no diarrhea, no headaches, no nausea and no vomiting     Past Medical History:  Diagnosis Date   Dystonia    High cholesterol    Hypertension    Thyroid disease     Patient Active Problem List   Diagnosis Date Noted   Generalized weakness 03/16/2019   Thyroid disease    Dystonia    Pneumonia due to COVID-19 virus    Elevated transaminase level    Hypertension    Chest pain     Past Surgical History:  Procedure Laterality Date   TUBAL LIGATION       OB History   No obstetric history on file.      Home Medications    Prior to Admission medications   Medication Sig Start Date End Date Taking? Authorizing Provider  acetaminophen (TYLENOL) 500 MG  tablet Take 1,000 mg by mouth every 6 (six) hours as needed for mild pain or headache.    [provider]  candesartan (ATACAND) 4 MG tablet Take 4 mg by mouth daily. 08/26/18   [provider]  carvedilol (COREG) 6.25 MG tablet Take 6.25 mg by mouth 2 (two) times a day. 09/13/18   [provider]  doxycycline (VIBRAMYCIN) 100 MG capsule Take 100 mg by mouth 2 (two) times a day. 11/05/18   [provider]  escitalopram (LEXAPRO) 10 MG tablet Take 10 mg by mouth daily. 08/24/18   [provider]  ezetimibe (ZETIA) 10 MG tablet Take 10 mg by mouth at bedtime. 09/21/18   [provider]  hydrOXYzine (ATARAX/VISTARIL) 10 MG tablet Take 1 tablet (10 mg total) by mouth every 8 (eight) hours as needed for anxiety. 11/11/18   Tilden Fossa, MD  levothyroxine (SYNTHROID) 75 MCG tablet Take 75 mcg by mouth daily. 08/10/18   [provider]  LIVALO 2 MG TABS Take 2 mg by mouth daily. 05/20/18   [provider]  triamterene-hydrochlorothiazide (MAXZIDE-25) 37.5-25 MG tablet Take 1 tablet by mouth daily. 09/13/18   [provider]    Family History History reviewed. No pertinent family history.  Social History Social History   Tobacco Use   Smoking status: Never Smoker   Smokeless tobacco: Never Used  Substance Use  Topics   Alcohol use: Yes    Comment: social drinker, infrequent   Drug use: Never     Allergies   Sinemet [carbidopa w-levodopa], Codeine, and Tetracyclines & related   Review of Systems Review of Systems  Constitutional: Positive for fever.  HENT: Negative for rhinorrhea and sore throat.   Eyes: Negative for photophobia.  Respiratory: Positive for cough and shortness of breath.   Cardiovascular: Negative for chest pain.  Gastrointestinal: Negative for abdominal pain, diarrhea, nausea and vomiting.  Genitourinary: Negative for flank pain.  Musculoskeletal: Negative for back pain.  Skin: Negative for  pallor and wound.  Neurological: Positive for dizziness, weakness and light-headedness. Negative for syncope, speech difficulty, numbness and headaches.     Physical Exam Updated Vital Signs BP (!) 146/79    Pulse (!) 51    Temp 98.1 F (36.7 C) (Oral)    Resp 14    SpO2 96%   Physical Exam Vitals signs and nursing note reviewed.  Constitutional:      Appearance: She is ill-appearing.  HENT:     Head: Normocephalic and atraumatic.     Nose: Nose normal.     Mouth/Throat:     Mouth: Mucous membranes are moist.  Eyes:     Pupils: Pupils are equal, round, and reactive to light.  Neck:     Musculoskeletal: Normal range of motion and neck supple.  Cardiovascular:     Rate and Rhythm: Normal rate.  Pulmonary:     Effort: Pulmonary effort is normal.     Breath sounds: No wheezing or rales.  Chest:     Chest wall: Tenderness present.  Abdominal:     General: Abdomen is flat.     Palpations: Abdomen is soft.     Tenderness: There is abdominal tenderness. There is no right CVA tenderness or left CVA tenderness.  Genitourinary:    Vagina: No vaginal discharge.  Skin:    General: Skin is warm and dry.  Neurological:     Mental Status: She is alert and oriented to person, place, and time.      ED Treatments / Results  Labs (all labs ordered are listed, but only abnormal results are displayed) Labs Reviewed  COMPREHENSIVE METABOLIC PANEL - Abnormal; Notable for the following components:      Result Value   Glucose, Bld 123 (*)    BUN 34 (*)    AST 46 (*)    ALT 54 (*)    GFR calc non Af Amer 57 (*)    All other components within normal limits  CBC WITH DIFFERENTIAL/PLATELET - Abnormal; Notable for the following components:   MCH 34.3 (*)    Abs Immature Granulocytes 0.13 (*)    All other components within normal limits  LACTIC ACID, PLASMA  LACTIC ACID, PLASMA  CK  TROPONIN I (HIGH SENSITIVITY)    EKG None  Radiology Ct Angio Chest Pe W And/or Wo  Contrast  Result Date: 03/16/2019 CLINICAL DATA:  Shortness of breath, COVID-19 positive. EXAM: CT ANGIOGRAPHY CHEST WITH CONTRAST TECHNIQUE: Multidetector CT imaging of the chest was performed using the standard protocol during bolus administration of intravenous contrast. Multiplanar CT image reconstructions and MIPs were obtained to evaluate the vascular anatomy. CONTRAST:  63mL OMNIPAQUE IOHEXOL 350 MG/ML SOLN COMPARISON:  CTA November 04, 2018 FINDINGS: Cardiovascular: Satisfactory opacification of the pulmonary arteries to the lobar level. More distal evaluation limited by respiratory motion and underlying parenchymal disease. No pulmonary artery filling defects are seen  to the lobar level. Pulmonary arterial caliber is upper limits normal, unchanged from comparison. Atherosclerotic plaque of the nonaneurysmal thoracic aorta. Normal 3 vessel branching of the arch with scant calcification in the proximal great vessels. Mild cardiomegaly with biatrial enlargement. No pericardial effusion. No elevation of the RV/LV ratio (0.75). Mediastinum/Nodes: No enlarged mediastinal, hilar or axillary lymph nodes. Thyroid gland, trachea, and esophagus demonstrate no significant findings. Lungs/Pleura: Multifocal areas peripheral predominant ground-glass are present in the lungs. No pneumothorax or effusion. Mild septal thickening noted in the apices and bases. Diffuse airways thickening and scattered secretions. Upper Abdomen: Non rotation of both kidneys, anatomic variant. Partial fatty replacement of the pancreas. Slight reflux of contrast into the hepatic veins. Musculoskeletal: Multilevel degenerative changes are present in the imaged portions of the spine. No acute osseous abnormality or suspicious osseous lesion. No concerning chest wall lesions S-shaped curvature of the thoracolumbar spine vertebroplasty changes at L1, partially visualized. Review of the MIP images confirms the above findings. IMPRESSION: 1. No  evidence of pulmonary embolism to the lobar level. More distal evaluation limited by respiratory motion and underlying parenchymal disease. 2. Multifocal areas of peripheral predominant ground-glass in the lungs, compatible with atypical infection in the setting of known COVID-19 positivity. 3. Some septal thickening in the apices may reflect superimposed edema. 4. Aortic Atherosclerosis (ICD10-I70.0). Electronically Signed   By: Kreg ShropshirePrice  DeHay M.D.   On: 03/16/2019 21:15   Dg Chest Portable 1 View  Result Date: 03/16/2019 CLINICAL DATA:  Initial evaluation for acute cough, weakness, OB positive. EXAM: PORTABLE CHEST 1 VIEW COMPARISON:  Prior radiograph from 11/11/2018. FINDINGS: Cardiac and mediastinal silhouettes within normal limits. Lungs mildly hypoinflated. Linear atelectatic changes noted within the right perihilar region, lingula, and left lung base. No other focal airspace disease. No edema or effusion. No pneumothorax. No acute osseous finding. IMPRESSION: 1. Mild scattered subsegmental atelectasis within the right perihilar region and left lung base. 2. No other radiographic evidence for active cardiopulmonary disease. Electronically Signed   By: Rise MuBenjamin  McClintock M.D.   On: 03/16/2019 19:15    Procedures Procedures (including critical care time)  Medications Ordered in ED Medications  iohexol (OMNIPAQUE) 350 MG/ML injection 75 mL (75 mLs Intravenous Contrast Given 03/16/19 2039)  sodium chloride 0.9 % bolus 1,000 mL (1,000 mLs Intravenous New Bag/Given 03/16/19 2143)     Initial Impression / Assessment and Plan / ED Course  I have reviewed the triage vital signs and the nursing notes.  Pertinent labs & imaging results that were available during my care of the patient were reviewed by me and considered in my medical decision making (see chart for details).    Patient with a recent diagnosis of COVID-19, tested [facility] on [facility] 16 [facility].  Returns  to the ED today with complaints of worsening symptoms.  Patient reports she feels like is very hard for her to catch her breath, is unable to take deep breaths.  She arrived in the ED satting at 93% on room air.  She is ill-appearing, states she feels like she is hurting all over, also has some dizziness.  Of note, 1 patient was admitted to [facility] she had severe hallucinations due to her Covid 19 infection.  She also reports a subjective fever along with a dry cough.  Patient has completed IV azithromycin per [facility] and a steroid pack.  She feels a severe pressure to the middle of her chest, like someone is sitting on  top of it.  Patient currently gets around via wheelchair, states she has not been able to ambulate at all as she feels very dizzy.  Patient does have a history of dystonia and is currently followed by neurology in [place] as well as a neurologist at [facility].  CBC without any leukocytosis.  CMP with some slight elevated glucose, patient reports a questionable borderline diabetes.  LFTs are slightly elevated, suspect due to viral infection.  She has been taking Tylenol for her symptoms but reports only taking one Tylenol daily, low suspicion for any Al-Anon toxicity.  Creatinine level is within normal limits.  Lactic acid was negative.  Patient's oxygen has remained above 95% although once rechecked at 7:00 dropped down to 93%.  Due to patient's past medical history along with COVID-19 infection will obtain CT angio to rule out pulmonary embolism.   8:43 PM spoke to patient's daughter Idalia Needle at number listed on the chart.  She was informed of patient's current plan and status.  She does report her mother has been feeling unwell for the past couple of days, reports she feels like she is gotten worse.  She reports anorexia, states patient has not eaten since last night. Will provide patient with IV fluids to help with rehydration.  CT PE showed:  1. No evidence of  pulmonary embolism to the lobar level. More distal  evaluation limited by respiratory motion and underlying parenchymal  disease.  2. Multifocal areas of peripheral predominant ground-glass in the  lungs, compatible with atypical infection in the setting of known  COVID-19 positivity.  3. Some septal thickening in the apices may reflect superimposed  edema.  4. Aortic Atherosclerosis (ICD10-I70.0).        10:12 PM Spoke to patient's daughter Idalia Needle who was informed of results.   Spoke to Dr. Antionette Char hospitalist service who will admit patient for further management of Covid 19 infection  Portions of this note were generated with Dragon dictation software. Dictation errors may occur despite best attempts at proofreading.  Final Clinical Impressions(s) / ED Diagnoses   Final diagnoses:  Weakness  COVID-19 virus infection  Shortness of breath    ED Discharge Orders    None       Freddy Jaksch 03/16/19 2235    Lorre Nick, MD 03/17/19 1349

## 2019-03-16 NOTE — H&P (Signed)
History and Physical    Debra Morgan GEX:[id number] DOB: April 26, 1946 DOA: 03/16/2019  PCP: Karleen Hampshire., MD   Patient coming from: Home   Chief Complaint: Generalized weakness, fatigue, cough, SOB, chest pressure   HPI: Debra Morgan is a 73 y.o. 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  Patient reports developing nonproductive cough and sore throat approximately 2 weeks ago, tested positive for COVID-19 on 03/04/2019 (see Care Everywhere), developed hallucinations and worsening cough and dyspnea that prompted hospitalization on 03/10/2019.  Patient never required supplemental oxygen during the hospitalization, her hallucinations resolved, she was treated with Decadron, and returned home on 03/14/2019.  Since the discharge, she reports no improvement in her cough and dyspnea, has chest pressure described as though someone is sitting on her chest, and has become increasingly weak in general.  Patient reports that she is wheelchair-bound at baseline but is usually able to help transfer between bed and chair.  She reports feeling too weak in general to do much of anything at this point.  ED Course: Upon arrival to the ED, patient is found to be afebrile, saturating mid to upper 90s on room air, normal respiratory rate, and stable blood pressure.  EKG features sinus bradycardia with rate 53.  Chemistry panel features slight elevation in transaminases and elevated BUN to creatinine ratio.  CBC is unremarkable, lactic acid is reassuringly normal, and CTA chest is negative for PE but notable for multifocal groundglass opacities concerning for atypical infection, as well as septal thickening which could reflect edema.  The patient was given a liter  of normal saline in the ED.  Review of Systems:  All other systems reviewed and apart from HPI, are negative.  Past Medical History:  Diagnosis Date   Dystonia    High cholesterol    Hypertension    Thyroid disease     Past Surgical History:  Procedure Laterality Date   TUBAL LIGATION       reports that she has never smoked. She has never used smokeless tobacco. She reports current alcohol use. She reports that she does not use drugs.  Allergies  Allergen Reactions   Sinemet [Carbidopa W-Levodopa] Other (See Comments)    Feels "jittery"   Codeine Palpitations   Tetracyclines & Related Rash    History reviewed. No pertinent family history.   Prior to Admission medications   Medication Sig Start Date End Date Taking? Authorizing Provider  acetaminophen (TYLENOL) 500 MG tablet Take 1,000 mg by mouth every 6 (six) hours as needed for mild pain or headache.    [provider]  candesartan (ATACAND) 4 MG tablet Take 4 mg by mouth daily. 08/26/18   [provider]  carvedilol (COREG) 6.25 MG tablet Take 6.25 mg by mouth 2 (two) times a day. 09/13/18   [provider]  doxycycline (VIBRAMYCIN) 100 MG capsule Take 100 mg by mouth 2 (two) times a day. 11/05/18   [provider]  escitalopram (LEXAPRO) 10 MG tablet Take 10 mg by mouth daily. 08/24/18   [provider]  ezetimibe (ZETIA) 10 MG tablet Take 10 mg by mouth at bedtime. 09/21/18   [provider]  hydrOXYzine (ATARAX/VISTARIL) 10 MG tablet Take 1 tablet (10 mg total) by mouth every 8 (eight) hours as needed for anxiety. 11/11/18  Tilden Fossa, MD  levothyroxine (SYNTHROID) 75 MCG tablet Take 75 mcg by mouth daily. 08/10/18   [provider]  LIVALO 2 MG TABS Take 2 mg by mouth daily. 05/20/18   [provider]  triamterene-hydrochlorothiazide (MAXZIDE-25) 37.5-25 MG tablet Take 1 tablet by mouth daily. 09/13/18   [provider]    Physical  Exam: Vitals:   03/16/19 2015 03/16/19 2145 03/16/19 2150 03/16/19 2200  BP: 130/76 (!) 146/79  (!) 150/74  Pulse: (!) 49 (!) 51  (!) 48  Resp: Temp:   98.1 F (36.7 C)   TempSrc:   Oral   SpO2: 95% 96%  95%    Constitutional: NAD, calm  Eyes: PERTLA, lids and conjunctivae normal ENMT: Mucous membranes are moist. Posterior pharynx clear of any exudate or lesions.   Neck: normal, supple, no masses, no thyromegaly Respiratory: no wheezing, no crackles. Normal respiratory effort. No accessory muscle use.  Cardiovascular: S1 & S2 heard, regular rate and rhythm. Trace lower leg edema b/l.   Abdomen: No distension, no tenderness, soft. Bowel sounds normal.  Musculoskeletal: no clubbing / cyanosis. No joint deformity upper and lower extremities.    Skin: no significant rashes, lesions, ulcers. Warm, dry, well-perfused. Neurologic: CN 2-12 grossly intact. Sensation intact. Generalized weakness.  Psychiatric: Alert and oriented to person, place, and situation. Calm, cooperative.    Labs on Admission: I have personally reviewed following labs and imaging studies  CBC: Recent Labs  Lab 03/16/19 1146  WBC 7.9  NEUTROABS 5.7  HGB 15.0  HCT 43.1  MCV 98.6  PLT 351   Basic Metabolic Panel: Recent Labs  Lab 03/16/19 1146  NA 137  K 4.3  CL 103  CO2 22  GLUCOSE 123*  BUN 34*  CREATININE 0.98  CALCIUM 9.7   GFR: CrCl cannot be calculated (Unknown ideal weight.). Liver Function Tests: Recent Labs  Lab 03/16/19 1146  AST 46*  ALT 54*  ALKPHOS 77  BILITOT 0.3  PROT 7.2  ALBUMIN 3.5   No results for input(s): LIPASE, AMYLASE in the last 168 hours. No results for input(s): AMMONIA in the last 168 hours. Coagulation Profile: No results for input(s): INR, PROTIME in the last 168 hours. Cardiac Enzymes: No results for input(s): CKTOTAL, CKMB, CKMBINDEX, TROPONINI in the last 168 hours. BNP (last 3 results) No results for input(s): PROBNP in the last 8760  hours. HbA1C: No results for input(s): HGBA1C in the last 72 hours. CBG: No results for input(s): GLUCAP in the last 168 hours. Lipid Profile: No results for input(s): CHOL, HDL, LDLCALC, TRIG, CHOLHDL, LDLDIRECT in the last 72 hours. Thyroid Function Tests: No results for input(s): TSH, T4TOTAL, FREET4, T3FREE, THYROIDAB in the last 72 hours. Anemia Panel: No results for input(s): VITAMINB12, FOLATE, FERRITIN, TIBC, IRON, RETICCTPCT in the last 72 hours. Urine analysis: No results found for: COLORURINE, APPEARANCEUR, LABSPEC, PHURINE, GLUCOSEU, HGBUR, BILIRUBINUR, KETONESUR, PROTEINUR, UROBILINOGEN, NITRITE, LEUKOCYTESUR Sepsis Labs: (procalcitonin:4,lacticidven:4) )No results found for this or any previous visit (from the past 240 hour(s)).   Radiological Exams on Admission: Ct Angio Chest Pe W And/or Wo Contrast  Result Date: 03/16/2019 CLINICAL DATA:  Shortness of breath, COVID-19 positive. EXAM: CT ANGIOGRAPHY CHEST WITH CONTRAST TECHNIQUE: Multidetector CT imaging of the chest was performed using the standard protocol during bolus administration of intravenous contrast. Multiplanar CT image reconstructions and MIPs were obtained to evaluate the vascular anatomy. CONTRAST:  75mL OMNIPAQUE IOHEXOL 350 MG/ML SOLN COMPARISON:  CTA November 04, 2018 FINDINGS: Cardiovascular: Satisfactory opacification of the pulmonary arteries to the lobar level. More distal evaluation limited by respiratory motion and underlying parenchymal disease. No pulmonary artery filling defects are seen to the lobar level. Pulmonary arterial caliber is upper limits normal, unchanged from comparison. Atherosclerotic plaque of the nonaneurysmal thoracic aorta. Normal 3 vessel branching of the arch with scant calcification in the proximal great vessels. Mild cardiomegaly with biatrial enlargement. No pericardial effusion. No elevation of the RV/LV ratio (0.75). Mediastinum/Nodes: No enlarged mediastinal, hilar or  axillary lymph nodes. Thyroid gland, trachea, and esophagus demonstrate no significant findings. Lungs/Pleura: Multifocal areas peripheral predominant ground-glass are present in the lungs. No pneumothorax or effusion. Mild septal thickening noted in the apices and bases. Diffuse airways thickening and scattered secretions. Upper Abdomen: Non rotation of both kidneys, anatomic variant. Partial fatty replacement of the pancreas. Slight reflux of contrast into the hepatic veins. Musculoskeletal: Multilevel degenerative changes are present in the imaged portions of the spine. No acute osseous abnormality or suspicious osseous lesion. No concerning chest wall lesions S-shaped curvature of the thoracolumbar spine vertebroplasty changes at L1, partially visualized. Review of the MIP images confirms the above findings. IMPRESSION: 1. No evidence of pulmonary embolism to the lobar level. More distal evaluation limited by respiratory motion and underlying parenchymal disease. 2. Multifocal areas of peripheral predominant ground-glass in the lungs, compatible with atypical infection in the setting of known COVID-19 positivity. 3. Some septal thickening in the apices may reflect superimposed edema. 4. Aortic Atherosclerosis (ICD10-I70.0). Electronically Signed   By: Kreg ShropshirePrice  DeHay M.D.   On: 03/16/2019 21:15   Dg Chest Portable 1 View  Result Date: 03/16/2019 CLINICAL DATA:  Initial evaluation for acute cough, weakness, OB positive. EXAM: PORTABLE CHEST 1 VIEW COMPARISON:  Prior radiograph from 11/11/2018. FINDINGS: Cardiac and mediastinal silhouettes within normal limits. Lungs mildly hypoinflated. Linear atelectatic changes noted within the right perihilar region, lingula, and left lung base. No other focal airspace disease. No edema or effusion. No pneumothorax. No acute osseous finding. IMPRESSION: 1. Mild scattered subsegmental atelectasis within the right perihilar region and left lung base. 2. No other radiographic  evidence for active cardiopulmonary disease. Electronically Signed   By: Rise MuBenjamin  McClintock M.D.   On: 03/16/2019 19:15    EKG: Independently reviewed. Sinus bradycardia, rate 53.   Assessment/Plan   1. Generalized weakness  - Presents with progressive generalized weakness in setting of COVID-19 infection, reports that she is wheelchair/bed-bound at baseline but can usually help with transfers but now feels too weak and fatigued to do anything  - There were no acute findings on CT head a few days ago, no recent fall or trauma, no headache or new focal deficit  - Check CK, consult PT, continue supportive care   2. COVID-19 pneumonia  - Developed sxs ~2 wks ago, tested positive on 10/16 (see Care Everywhere), has ongoing cough and SOB, multifocal ground-glass opacities  - There is no tachypnea or hypoxia in ED  - Check/trend inflammatory markers, continue supportive care, continue airborne and contact precautions   3. Chest pressure  - Patient reports feeling as though someone is sitting on her chest; she had similar complaints during recent admission with negative workup at that time and reportedly had non-ischemic stress test 2 months ago   - No acute ischemic features noted on EKG  - Check troponin, continue cardiac monitoring   4. Hypertension  - Continue Coreg and ARB   5. Elevated transaminases  - Slight elevation in transaminases  noted  - Abdominal exam benign, likely secondary to viral infection, will trend   6. Movement disorder  - Followed by neurology at [facility]  - Continue Cogentin    PPE: CAPR, gown, gloves  DVT prophylaxis: Lovenox  Code Status: Full  Family Communication: Discussed with patient  Consults called: None  Admission status: Observation     Briscoe Deutscher, MD [facility] Pager [phone]  If 7PM-7AM, please contact night-coverage [email or web address] Password [facility]  03/16/2019, 10:49 PM

## 2019-03-17 ENCOUNTER — Encounter (HOSPITAL_COMMUNITY): Payer: Self-pay

## 2019-03-17 DIAGNOSIS — R7401 Elevation of levels of liver transaminase levels: Secondary | ICD-10-CM | POA: Diagnosis not present

## 2019-03-17 DIAGNOSIS — I700 Atherosclerosis of aorta: Secondary | ICD-10-CM | POA: Diagnosis present

## 2019-03-17 DIAGNOSIS — R079 Chest pain, unspecified: Secondary | ICD-10-CM | POA: Diagnosis not present

## 2019-03-17 DIAGNOSIS — Z66 Do not resuscitate: Secondary | ICD-10-CM | POA: Diagnosis not present

## 2019-03-17 DIAGNOSIS — Z9851 Tubal ligation status: Secondary | ICD-10-CM | POA: Diagnosis not present

## 2019-03-17 DIAGNOSIS — R630 Anorexia: Secondary | ICD-10-CM | POA: Diagnosis present

## 2019-03-17 DIAGNOSIS — R001 Bradycardia, unspecified: Secondary | ICD-10-CM | POA: Diagnosis present

## 2019-03-17 DIAGNOSIS — G249 Dystonia, unspecified: Secondary | ICD-10-CM | POA: Diagnosis present

## 2019-03-17 DIAGNOSIS — R531 Weakness: Secondary | ICD-10-CM | POA: Diagnosis not present

## 2019-03-17 DIAGNOSIS — Z993 Dependence on wheelchair: Secondary | ICD-10-CM | POA: Diagnosis not present

## 2019-03-17 DIAGNOSIS — R45851 Suicidal ideations: Secondary | ICD-10-CM | POA: Diagnosis not present

## 2019-03-17 DIAGNOSIS — U071 COVID-19: Secondary | ICD-10-CM | POA: Diagnosis present

## 2019-03-17 DIAGNOSIS — R42 Dizziness and giddiness: Secondary | ICD-10-CM | POA: Diagnosis present

## 2019-03-17 DIAGNOSIS — F419 Anxiety disorder, unspecified: Secondary | ICD-10-CM | POA: Diagnosis present

## 2019-03-17 DIAGNOSIS — Z79890 Hormone replacement therapy (postmenopausal): Secondary | ICD-10-CM | POA: Diagnosis not present

## 2019-03-17 DIAGNOSIS — G609 Hereditary and idiopathic neuropathy, unspecified: Secondary | ICD-10-CM | POA: Diagnosis present

## 2019-03-17 DIAGNOSIS — Z6828 Body mass index (BMI) 28.0-28.9, adult: Secondary | ICD-10-CM | POA: Diagnosis not present

## 2019-03-17 DIAGNOSIS — F331 Major depressive disorder, recurrent, moderate: Secondary | ICD-10-CM | POA: Diagnosis present

## 2019-03-17 DIAGNOSIS — I10 Essential (primary) hypertension: Secondary | ICD-10-CM | POA: Diagnosis present

## 2019-03-17 DIAGNOSIS — R7989 Other specified abnormal findings of blood chemistry: Secondary | ICD-10-CM | POA: Diagnosis not present

## 2019-03-17 DIAGNOSIS — Z7401 Bed confinement status: Secondary | ICD-10-CM | POA: Diagnosis not present

## 2019-03-17 DIAGNOSIS — R0602 Shortness of breath: Secondary | ICD-10-CM | POA: Diagnosis present

## 2019-03-17 DIAGNOSIS — Z792 Long term (current) use of antibiotics: Secondary | ICD-10-CM | POA: Diagnosis not present

## 2019-03-17 DIAGNOSIS — F411 Generalized anxiety disorder: Secondary | ICD-10-CM | POA: Diagnosis not present

## 2019-03-17 DIAGNOSIS — E039 Hypothyroidism, unspecified: Secondary | ICD-10-CM | POA: Diagnosis present

## 2019-03-17 DIAGNOSIS — E785 Hyperlipidemia, unspecified: Secondary | ICD-10-CM | POA: Diagnosis present

## 2019-03-17 DIAGNOSIS — Z79899 Other long term (current) drug therapy: Secondary | ICD-10-CM | POA: Diagnosis not present

## 2019-03-17 DIAGNOSIS — R339 Retention of urine, unspecified: Secondary | ICD-10-CM | POA: Diagnosis not present

## 2019-03-17 DIAGNOSIS — N390 Urinary tract infection, site not specified: Secondary | ICD-10-CM | POA: Diagnosis not present

## 2019-03-17 DIAGNOSIS — G9341 Metabolic encephalopathy: Secondary | ICD-10-CM | POA: Diagnosis not present

## 2019-03-17 LAB — COMPREHENSIVE METABOLIC PANEL
ALT: 63 U/L — ABNORMAL HIGH (ref 0–44)
AST: 47 U/L — ABNORMAL HIGH (ref 15–41)
Albumin: 3.1 g/dL — ABNORMAL LOW (ref 3.5–5.0)
Alkaline Phosphatase: 65 U/L (ref 38–126)
Anion gap: 8 (ref 5–15)
BUN: 32 mg/dL — ABNORMAL HIGH (ref 8–23)
CO2: 24 mmol/L (ref 22–32)
Calcium: 9.1 mg/dL (ref 8.9–10.3)
Chloride: 104 mmol/L (ref 98–111)
Creatinine, Ser: 0.95 mg/dL (ref 0.44–1.00)
GFR calc Af Amer: >60 mL/min (ref >60)
GFR calc non Af Amer: 59 mL/min — ABNORMAL LOW (ref >60)
Glucose, Bld: 133 mg/dL — ABNORMAL HIGH (ref 70–99)
Potassium: 3.9 mmol/L (ref 3.5–5.1)
Sodium: 136 mmol/L (ref 135–145)
Total Bilirubin: 0.7 mg/dL (ref 0.3–1.2)
Total Protein: 6.4 g/dL — ABNORMAL LOW (ref 6.5–8.1)

## 2019-03-17 LAB — CBC WITH DIFFERENTIAL/PLATELET
Abs Immature Granulocytes: 0.11 10*3/uL — ABNORMAL HIGH (ref 0.00–0.07)
Basophils Absolute: 0 10*3/uL (ref 0.0–0.1)
Basophils Relative: 0 %
Eosinophils Absolute: 0 10*3/uL (ref 0.0–0.5)
Eosinophils Relative: 0 %
HCT: 39.4 % (ref 36.0–46.0)
Hemoglobin: 13.4 g/dL (ref 12.0–15.0)
Immature Granulocytes: 2 %
Lymphocytes Relative: 22 %
Lymphs Abs: 1.4 10*3/uL (ref 0.7–4.0)
MCH: 32.9 pg (ref 26.0–34.0)
MCHC: 34 g/dL (ref 30.0–36.0)
MCV: 96.8 fL (ref 80.0–100.0)
Monocytes Absolute: 0.5 10*3/uL (ref 0.1–1.0)
Monocytes Relative: 8 %
Neutro Abs: 4.4 10*3/uL (ref 1.7–7.7)
Neutrophils Relative %: 68 %
Platelets: 341 10*3/uL (ref 150–400)
RBC: 4.07 MIL/uL (ref 3.87–5.11)
RDW: 12.6 % (ref 11.5–15.5)
WBC: 6.5 10*3/uL (ref 4.0–10.5)
nRBC: 0 % (ref 0.0–0.2)

## 2019-03-17 LAB — FERRITIN: Ferritin: 741 ng/mL — ABNORMAL HIGH (ref 11–307)

## 2019-03-17 LAB — PROCALCITONIN: Procalcitonin: <0.1 ng/mL

## 2019-03-17 LAB — C-REACTIVE PROTEIN: CRP: <0.8 mg/dL (ref <1.0)

## 2019-03-17 LAB — D-DIMER, QUANTITATIVE: D-Dimer, Quant: 0.65 ug/mL-FEU — ABNORMAL HIGH (ref 0.00–0.50)

## 2019-03-17 LAB — PHOSPHORUS: Phosphorus: 2.9 mg/dL (ref 2.5–4.6)

## 2019-03-17 LAB — BRAIN NATRIURETIC PEPTIDE: B Natriuretic Peptide: 119.6 pg/mL — ABNORMAL HIGH (ref 0.0–100.0)

## 2019-03-17 LAB — MAGNESIUM: Magnesium: 2.1 mg/dL (ref 1.7–2.4)

## 2019-03-17 LAB — ABO/RH: ABO/RH(D): O POS

## 2019-03-17 MED ORDER — ENOXAPARIN SODIUM 40 MG/0.4ML ~~LOC~~ SOLN
40.0000 mg | SUBCUTANEOUS | Status: DC
  Administered 2019-03-17 – 2019-03-26 (×10): 40 mg via SUBCUTANEOUS
  Filled 2019-03-17 (×10): qty 0.4

## 2019-03-17 MED ORDER — SODIUM CHLORIDE 0.9% FLUSH: 3.0000 mL | INTRAVENOUS | Status: DC | PRN

## 2019-03-17 MED ORDER — SODIUM CHLORIDE 0.9 % IV SOLN: 250.0000 mL | INTRAVENOUS | Status: DC | PRN

## 2019-03-17 MED ORDER — EZETIMIBE 10 MG PO TABS
10.0000 mg | ORAL_TABLET | Freq: Every day | ORAL | Status: DC
  Administered 2019-03-17 – 2019-03-26 (×10): 10 mg via ORAL
  Filled 2019-03-17 (×11): qty 1

## 2019-03-17 MED ORDER — SENNOSIDES-DOCUSATE SODIUM 8.6-50 MG PO TABS
1.0000 | ORAL_TABLET | Freq: Every evening | ORAL | Status: DC | PRN
  Administered 2019-03-17: 1 via ORAL
  Filled 2019-03-17: qty 1

## 2019-03-17 MED ORDER — SODIUM CHLORIDE 0.9% FLUSH
3.0000 mL | Freq: Two times a day (BID) | INTRAVENOUS | Status: DC
  Administered 2019-03-17 – 2019-03-25 (×17): 3 mL via INTRAVENOUS

## 2019-03-17 MED ORDER — CARVEDILOL 3.125 MG PO TABS
3.1250 mg | ORAL_TABLET | Freq: Two times a day (BID) | ORAL | Status: DC
  Administered 2019-03-18 – 2019-03-25 (×15): 3.125 mg via ORAL
  Filled 2019-03-17 (×19): qty 1

## 2019-03-17 MED ORDER — SODIUM CHLORIDE 0.9% FLUSH
3.0000 mL | Freq: Two times a day (BID) | INTRAVENOUS | Status: DC
  Administered 2019-03-17 (×3): 3 mL via INTRAVENOUS

## 2019-03-17 MED ORDER — FUROSEMIDE 10 MG/ML IJ SOLN
40.0000 mg | Freq: Once | INTRAMUSCULAR | Status: AC
  Administered 2019-03-17: 40 mg via INTRAVENOUS
  Filled 2019-03-17: qty 4

## 2019-03-17 NOTE — Progress Notes (Signed)
Patient's daughter, [name], updated on patient condition and plan of care.  All questions welcomed and answered.  Daughter stated that family was in agreement that pt needs rehab before coming home.

## 2019-03-17 NOTE — Plan of Care (Signed)
Pt progressing towards goal as suspected

## 2019-03-17 NOTE — Progress Notes (Signed)
SATURATION QUALIFICATIONS: (This note is used to comply with regulatory documentation for home oxygen)  Patient Saturations on Room Air at Rest = 95%  Patient Saturations on Room Air while Ambulating/with mobility = 93%  Patient Saturations on NA Liters of oxygen while Ambulating = NA%  Please briefly explain why patient needs home oxygen: Pt did not need O2 during limited EOB mobility.  Returned to 2 L O2 [place] at end of session and RN informed of sats on RA.   Barbarann Ehlers Remy Voiles, PT, DPT  Acute Rehabilitation [phone] pager [phone]) [phone] office  @ [place]: [phone]

## 2019-03-17 NOTE — Evaluation (Signed)
Physical Therapy Evaluation Patient Details Name: Debra Morgan MRN: [id number] DOB: 1946/04/26 Today's Date: 03/17/2019   History of Present Illness  73 y.o. female admitted on 03/16/19 for generalized weakness, fatigue, SOB, chest pressure.  Pt was recently hospitalized at [facility] from 10/22-10/26 for COVID 19 PNA and d/c home.  CT was negative for PE, suspicious for pulmonary edema, and typical GGO found with COVID 19.  Pt with PMH of HTN, dystonia.  Clinical Impression  Pt is weak and deconditioned, scared and anxious to move.  She reports dizziness (room spinning) when returning to bed, however, I did not see any nystagmus (will need further assessment of vestibular system if she continues to report).  HR 60s for bed level and EOB mobility and lowest O2 was 93 on RA during mobility as measured by finger probe.  BP stable.  Pt's anxiety is limiting her mobility which was already limited at baseline.  In her current mobility state she would be appropriate for SNF, but when discussed with her she wants to return home with home therapy (although she doesn't feel she can do that today).   PT to follow acutely for deficits listed below.      Follow Up Recommendations SNF;Other (comment)(Pt declining SNF, agreeable to home therapy)    Equipment Recommendations  None recommended by PT    Recommendations for Other Services   NA    Precautions / Restrictions Precautions Precautions: Fall Precaution Comments: h/o falls due to dystonia and R LE locking up      Mobility  Bed Mobility Overal bed mobility: Modified Independent             General bed mobility comments: Pt rolled bil and came to sitting EOB with use of bed rails.    Transfers                 General transfer comment: Pt was too nervous to attempt.   Ambulation/Gait             General Gait Details: Does not ambulate at baseline.          Balance Overall balance assessment: Needs  assistance Sitting-balance support: Feet unsupported;Bilateral upper extremity supported Sitting balance-Leahy Scale: Fair Sitting balance - Comments: close supervision EOB.  Pt was upright for 3 mins before reporting her breathing was getting tight and she needed to return to bed.  O2 sats on RA were lowest 93% and there was no audible DOE with mobility at bed level.         Standing balance comment: Pt did not want to attempt                             Pertinent Vitals/Pain Pain Assessment: Faces Pain Score: 3  Faces Pain Scale: Hurts little more Pain Location: joint discomfort R >L new since COVID Pain Descriptors / Indicators: Aching Pain Intervention(s): Limited activity within patient's tolerance;Monitored during session;Repositioned    Home Living Family/patient expects to be discharged to:: Private residence Living Arrangements: Spouse/significant other Available Help at Discharge: Family;Available 24 hours/day Type of Home: House Home Access: Ramped entrance       Home Equipment: [place] - 4 wheels;Wheelchair - manual(transport WC)      Prior Function Level of Independence: Independent with assistive device(s);Needs assistance   Gait / Transfers Assistance Needed: Pt reports she is normally independent with transfers to her WC, toilet, uses feet to mobilize around the house.  ADL's /  Homemaking Assistance Needed: does her own bathing, dressing, toileting, no longer gets in the shower.   Comments: does not wear glasses, has had cataract surgery     Hand Dominance   Dominant Hand: Right    Extremity/Trunk Assessment   Upper Extremity Assessment Upper Extremity Assessment: Defer to OT evaluation    Lower Extremity Assessment Lower Extremity Assessment: RLE deficits/detail;LLE deficits/detail RLE Deficits / Details: Right leg is weaker, more stiff, with less ankle ROM compared to left.  Grossly 3-/5 with in bed testing, in sitting R LE stays  extended at the knee despite being able to flex it in the bed.   RLE Sensation: decreased light touch;history of peripheral neuropathy LLE Deficits / Details: Stronger than R LE 3/5 per bed level testing.  LLE Sensation: decreased light touch;history of peripheral neuropathy    Cervical / Trunk Assessment Cervical / Trunk Assessment: Other exceptions Cervical / Trunk Exceptions: per pt report h/o kyphoplasty after a fall  Communication   Communication: No difficulties  Cognition Arousal/Alertness: Awake/alert Behavior During Therapy: Anxious(very, "I feel like I am dying") Overall Cognitive Status: Within Functional Limits for tasks assessed                                        General Comments General comments (skin integrity, edema, etc.): Provided pt with IS and flutter valved and instructed on use.  Will need reinforcement of use.        Assessment/Plan    PT Assessment Patient needs continued PT services  PT Problem List Decreased strength;Decreased range of motion;Decreased activity tolerance;Decreased balance;Decreased mobility;Decreased knowledge of use of DME;Decreased knowledge of precautions;Cardiopulmonary status limiting activity       PT Treatment Interventions DME instruction;Gait training;Functional mobility training;Therapeutic activities;Therapeutic exercise;Balance training;Neuromuscular re-education;Patient/family education;Wheelchair mobility training    PT Goals (Current goals can be found in the Care Plan section)  Acute Rehab PT Goals Patient Stated Goal: to feel better, breathe better PT Goal Formulation: With patient Time For Goal Achievement: 03/31/19 Potential to Achieve Goals: Good    Frequency Min 3X/week           AM-PAC PT "6 Clicks" Mobility  Outcome Measure Help needed turning from your back to your side while in a flat bed without using bedrails?: A Little Help needed moving from lying on your back to sitting on the  side of a flat bed without using bedrails?: A Little Help needed moving to and from a bed to a chair (including a wheelchair)?: Total Help needed standing up from a chair using your arms (e.g., wheelchair or bedside chair)?: Total Help needed to walk in hospital room?: Total Help needed climbing 3-5 steps with a railing? : Total 6 Click Score: 10    End of Session   Activity Tolerance: Other (comment)(pt limited by anxiety) Patient left: in bed;with call bell/phone within reach;with nursing/sitter in room Nurse Communication: Other (comment)(O2 sats 93% on RA during mobility) PT Visit Diagnosis: Muscle weakness (generalized) (M62.81);Difficulty in walking, not elsewhere classified (R26.2);History of falling (Z91.81)    Time: [id number] PT Time Calculation (min) (ACUTE ONLY): 40 min   Charges:       Lurena Joinerebecca B. Taison Celani, PT, DPT  Acute Rehabilitation [phone] pager [phone] office  @ [place]: [phone]   PT Evaluation $PT Eval Moderate Complexity: 1 Mod PT Treatments $Therapeutic Activity: 23-37 mins  03/17/2019, 11:50 AM

## 2019-03-17 NOTE — Progress Notes (Signed)
PROGRESS NOTE  Debra Morgan  CHE:[id number] DOB: Apr 10, 1946 DOA: 03/16/2019 PCP: Laqueta Due., MD  Outpatient Specialists: [facility], Dr. Armandina Stammer Brief Narrative: Debra Morgan is a 73 y.o. female with a history of dystonia NOS, wheelchair-bound for 3.5 years, HTN, and depression who presented from home to the ED 10/28 with ongoing severe diffuse weakness. She was diagnosed with covid-19 10/16 and subsequently admitted to [facility] from 10/22 - 10/26 for treatment of covid-19 pneumonia, received decadron, but did not require supplemental oxygen. She experienced visual hallucinations during that hospitalization which were resolved at discharge. Since discharge she reports difficulty taking deep breaths, chest pressure, anxiety, and progressive generalized weakness making transfers difficult. On evaluation in the ED she was not febrile nor hypoxic but did have CTA chest ruling out PE but demonstrating peripheral GGOs and apical septal thickening concerning for pulmonary edema. CRP <0.8, d-dimer 0.651L NS given and admission requested at [place].   Assessment & Plan: Principal Problem:   Generalized weakness Active Problems:   Thyroid disease   Dystonia   Pneumonia due to COVID-19 virus   Elevated transaminase level   Hypertension   Chest pain  Covid-19 pneumonia: GGOs on CT chest though no hypoxia. Now nearly 2 weeks from initial diagnosis and has received steroids already. Inflammatory markers not elevated.  - Hold off on target therapy for now.  - Airborne/contact precautions - Trend labs.  - If hallucinations return, previous hospitalists had good results with thorazine.   Acute pulmonary edema: Lexiscan stress testing Aug 2020 showed LVEF 76% with normal wall motion, no reversible ischemia. BNP 119.6. Weight 74.4kg up from outpatient records showing 71kg at neurology visit 10/8. No peripheral edema. - Give lasix x1 and monitor I/O, weights.   Generalized weakness on chronic dystonia: At high  risk for protracted recovery from covid. No new focal deficits on exam. Has not tolerated sinemet in the past and PD not felt to be accurate Dx, and botox injections have not helped. Dx by neurology is primary freezing of gait. No acute CT head findings - PT/OT consulted. Will consult CSW as I suspect the only safe disposition for the time being is to SNF for rehabilitation.   Chest pressure: Troponin negative, recent ischemic evaluation negative, no ischemic ECG changes. Doubt this is cardiogenic.  - Continue to treat supportively. Optimize oxygenation. - Treat anxiety.   Sinus bradycardia: Consistently in 40's and pt complaining of weakness. Had coreg discontinued during last hospitalization. Reportedly had nonischemic stress testing 2 months ago. No AVB on ECG - Decrease coreg 6.25 > 3.125mg  BID. Consider discontinuing this pending HR trends.   - Consider discontinuing telemetry if no arrhythmias over next 24 hours.  Hypothyroidism:  - Check TSH given bradycardia.  - Continue synthroid daily  LFT elevation: Mild ALT > AST, would be consistent with covid infection.  - Continue monitoring.   Depression, anxiety:  - Continue lexapro as pt reports taking this, will give prn alprazolam given anxiety's contribution to symptoms.   HTN:  - Continue ARB. Pt not sure she's taking Triamterene-HCTZ which we will hold since giving lasix.  HLD:  - Continue zetia 10mg , not on statin  Peripheral neuropathy: Idiopathic. Vitamin B12, B6, E, and MMA all wnl at recent check. HbA1c 5.6%  DVT prophylaxis: Lovenox Code Status: Full Family Communication: None at bedside, pt able to convey POC Disposition Plan: SNF once bed available.   Consultants:   None  Procedures:   None  Antimicrobials:  None   Subjective:  Continues to feel the same with midchest pressure present at rest and exertion, trouble taking deep breaths, and generalized weakness limiting ability to mobilize.   Objective: Vitals:   03/17/19 0000 03/17/19 0106 03/17/19 0133 03/17/19 0400  BP: 133/75 (!) 148/80  (!) 141/75  Pulse: (!) 54 (!) 53  (!) 48  Resp: 14 12  13   Temp:  (!) 97.3 F (36.3 C)  97.8 F (36.6 C)  TempSrc:  Oral  Oral  SpO2: 96% 94%  97%  Weight:   74.4 kg   Height:   5\' 4"  (1.626 m)     Intake/Output Summary (Last 24 hours) at 03/17/2019 0716 Last data filed at 03/17/2019 0500 Gross per 24 hour  Intake 0 ml  Output 300 ml  Net -300 ml   Filed Weights   03/17/19 0133  Weight: 74.4 kg    Gen: 73 y.o. female in no distress Pulm: Non-labored breathing. Clear to auscultation bilaterally.  CV: Regular rate and rhythm. No murmur, rub, or gallop. No JVD, no pedal edema. GI: Abdomen soft, non-tender, non-distended, with normoactive bowel sounds. No organomegaly or masses felt. Ext: Warm, no deformities Skin: No rashes, lesions or ulcers Neuro: Alert and oriented. Right foot weakness, toes wiggling but no tremor, no other focal neurological deficits. Psych: Judgement and insight appear normal. Mood anxious & affect appropriate.   Data Reviewed: I have personally reviewed following labs and imaging studies  CBC: Recent Labs  Lab 03/16/19 1146 03/17/19 0450  WBC 7.9 6.5  NEUTROABS 5.7 4.4  HGB 15.0 13.4  HCT 43.1 39.4  MCV 98.6 96.8  PLT 351 341   Basic Metabolic Panel: Recent Labs  Lab 03/16/19 1146 03/17/19 0450  NA 137 136  K 4.3 3.9  CL 103 104  CO2 22 24  GLUCOSE 123* 133*  BUN 34* 32*  CREATININE 0.98 0.95  CALCIUM 9.7 9.1  MG  --  2.1  PHOS  --  2.9   GFR: Estimated Creatinine Clearance: 51.3 mL/min (by C-G formula based on SCr of 0.95 mg/dL). Liver Function Tests: Recent Labs  Lab 03/16/19 1146 03/17/19 0450  AST 46* 47*  ALT 54* 63*  ALKPHOS 77 65  BILITOT 0.3 0.7  PROT 7.2 6.4*  ALBUMIN 3.5 3.1*   No results for input(s): LIPASE, AMYLASE in the last 168 hours. No results for input(s): AMMONIA in the last 168 hours.  Coagulation Profile: No results for input(s): INR, PROTIME in the last 168 hours. Cardiac Enzymes: Recent Labs  Lab 03/16/19 2245  CKTOTAL 76   BNP (last 3 results) No results for input(s): PROBNP in the last 8760 hours. HbA1C: No results for input(s): HGBA1C in the last 72 hours. CBG: No results for input(s): GLUCAP in the last 168 hours. Lipid Profile: No results for input(s): CHOL, HDL, LDLCALC, TRIG, CHOLHDL, LDLDIRECT in the last 72 hours. Thyroid Function Tests: No results for input(s): TSH, T4TOTAL, FREET4, T3FREE, THYROIDAB in the last 72 hours. Anemia Panel: Recent Labs    03/17/19 0450  FERRITIN 741*   Urine analysis: No results found for: COLORURINE, APPEARANCEUR, LABSPEC, PHURINE, GLUCOSEU, HGBUR, BILIRUBINUR, KETONESUR, PROTEINUR, UROBILINOGEN, NITRITE, LEUKOCYTESUR No results found for this or any previous visit (from the past 240 hour(s)).    Radiology Studies: Ct Angio Chest Pe W And/or Wo Contrast  Result Date: 03/16/2019 CLINICAL DATA:  Shortness of breath, COVID-19 positive. EXAM: CT ANGIOGRAPHY CHEST WITH CONTRAST TECHNIQUE: Multidetector CT imaging of the chest was performed using the standard protocol during bolus  administration of intravenous contrast. Multiplanar CT image reconstructions and MIPs were obtained to evaluate the vascular anatomy. CONTRAST:  37mL OMNIPAQUE IOHEXOL 350 MG/ML SOLN COMPARISON:  CTA November 04, 2018 FINDINGS: Cardiovascular: Satisfactory opacification of the pulmonary arteries to the lobar level. More distal evaluation limited by respiratory motion and underlying parenchymal disease. No pulmonary artery filling defects are seen to the lobar level. Pulmonary arterial caliber is upper limits normal, unchanged from comparison. Atherosclerotic plaque of the nonaneurysmal thoracic aorta. Normal 3 vessel branching of the arch with scant calcification in the proximal great vessels. Mild cardiomegaly with biatrial enlargement. No pericardial  effusion. No elevation of the RV/LV ratio (0.75). Mediastinum/Nodes: No enlarged mediastinal, hilar or axillary lymph nodes. Thyroid gland, trachea, and esophagus demonstrate no significant findings. Lungs/Pleura: Multifocal areas peripheral predominant ground-glass are present in the lungs. No pneumothorax or effusion. Mild septal thickening noted in the apices and bases. Diffuse airways thickening and scattered secretions. Upper Abdomen: Non rotation of both kidneys, anatomic variant. Partial fatty replacement of the pancreas. Slight reflux of contrast into the hepatic veins. Musculoskeletal: Multilevel degenerative changes are present in the imaged portions of the spine. No acute osseous abnormality or suspicious osseous lesion. No concerning chest wall lesions S-shaped curvature of the thoracolumbar spine vertebroplasty changes at L1, partially visualized. Review of the MIP images confirms the above findings. IMPRESSION: 1. No evidence of pulmonary embolism to the lobar level. More distal evaluation limited by respiratory motion and underlying parenchymal disease. 2. Multifocal areas of peripheral predominant ground-glass in the lungs, compatible with atypical infection in the setting of known COVID-19 positivity. 3. Some septal thickening in the apices may reflect superimposed edema. 4. Aortic Atherosclerosis (ICD10-I70.0). Electronically Signed   By: Lovena Le M.D.   On: 03/16/2019 21:15   Dg Chest Portable 1 View  Result Date: 03/16/2019 CLINICAL DATA:  Initial evaluation for acute cough, weakness, OB positive. EXAM: PORTABLE CHEST 1 VIEW COMPARISON:  Prior radiograph from 11/11/2018. FINDINGS: Cardiac and mediastinal silhouettes within normal limits. Lungs mildly hypoinflated. Linear atelectatic changes noted within the right perihilar region, lingula, and left lung base. No other focal airspace disease. No edema or effusion. No pneumothorax. No acute osseous finding. IMPRESSION: 1. Mild scattered  subsegmental atelectasis within the right perihilar region and left lung base. 2. No other radiographic evidence for active cardiopulmonary disease. Electronically Signed   By: Jeannine Boga M.D.   On: 03/16/2019 19:15    Scheduled Meds: . benztropine  1 mg Oral BID  . carvedilol  6.25 mg Oral BID WC  . enoxaparin (LOVENOX) injection  40 mg Subcutaneous Q24H  . irbesartan  37.5 mg Oral Daily  . levothyroxine  75 mcg Oral Q0600  . sodium chloride flush  3 mL Intravenous Q12H  . sodium chloride flush  3 mL Intravenous Q12H   Continuous Infusions: . sodium chloride       LOS: 0 days   Time spent: 25 minutes.  Patrecia Pour, MD [facility] [email or web address] 03/17/2019, 7:16 AM

## 2019-03-18 LAB — COMPREHENSIVE METABOLIC PANEL
ALT: 65 U/L — ABNORMAL HIGH (ref 0–44)
AST: 37 U/L (ref 15–41)
Albumin: 3.2 g/dL — ABNORMAL LOW (ref 3.5–5.0)
Alkaline Phosphatase: 71 U/L (ref 38–126)
Anion gap: 10 (ref 5–15)
BUN: 40 mg/dL — ABNORMAL HIGH (ref 8–23)
CO2: 25 mmol/L (ref 22–32)
Calcium: 9.3 mg/dL (ref 8.9–10.3)
Chloride: 102 mmol/L (ref 98–111)
Creatinine, Ser: 1.03 mg/dL — ABNORMAL HIGH (ref 0.44–1.00)
GFR calc Af Amer: >60 mL/min (ref >60)
GFR calc non Af Amer: 54 mL/min — ABNORMAL LOW (ref >60)
Glucose, Bld: 93 mg/dL (ref 70–99)
Potassium: 3.9 mmol/L (ref 3.5–5.1)
Sodium: 137 mmol/L (ref 135–145)
Total Bilirubin: 1 mg/dL (ref 0.3–1.2)
Total Protein: 6.5 g/dL (ref 6.5–8.1)

## 2019-03-18 LAB — TSH: TSH: 1.829 u[IU]/mL (ref 0.350–4.500)

## 2019-03-18 MED ORDER — ESCITALOPRAM OXALATE 10 MG PO TABS
10.0000 mg | ORAL_TABLET | Freq: Every day | ORAL | Status: DC
  Administered 2019-03-18 – 2019-03-26 (×9): 10 mg via ORAL
  Filled 2019-03-18 (×10): qty 1

## 2019-03-18 MED ORDER — ALPRAZOLAM 0.5 MG PO TABS
0.2500 mg | ORAL_TABLET | Freq: Two times a day (BID) | ORAL | Status: DC | PRN
  Administered 2019-03-18: 0.25 mg via ORAL
  Filled 2019-03-18: qty 1

## 2019-03-18 MED ORDER — BUSPIRONE HCL 15 MG PO TABS
7.5000 mg | ORAL_TABLET | Freq: Two times a day (BID) | ORAL | Status: DC
  Administered 2019-03-18 – 2019-03-25 (×15): 7.5 mg via ORAL
  Filled 2019-03-18 (×6): qty 1
  Filled 2019-03-18 (×3): qty 1.5
  Filled 2019-03-18 (×2): qty 1
  Filled 2019-03-18: qty 1.5
  Filled 2019-03-18 (×4): qty 1
  Filled 2019-03-18: qty 1.5
  Filled 2019-03-18 (×2): qty 1

## 2019-03-18 NOTE — TOC Progression Note (Signed)
Transition of Care [facility]) - Progression Note    Patient Details  Name: Debra Morgan MRN: [id number] Date of Birth: June 10, 1945  Transition of Care [facility]) CM/SW [place],  Phone Number:  [phone] 03/18/2019, 3:39 PM  Clinical Narrative:      CSW has initiated insurance authorization with [facility] on this day, al clinicals faxed.       Expected Discharge Plan and Services           Expected Discharge Date: 03/18/19                                     Social Determinants of Health (SDOH) Interventions    Readmission Risk Interventions No flowsheet data found.

## 2019-03-18 NOTE — Progress Notes (Signed)
PROGRESS NOTE  Debra Morgan  QZE:[id number] DOB: [birth year] DOA: 03/16/2019 PCP: Laqueta Due., MD  Outpatient Specialists: [facility], Dr. Armandina Stammer Brief Narrative: Debra Morgan is a 73 y.o. female with a history of dystonia NOS, wheelchair-bound for 3.5 years, HTN, and depression who presented from home to the ED 10/28 with ongoing severe diffuse weakness. She was diagnosed with covid-19 10/16 and subsequently admitted to [facility] from 10/22 - 10/26 for treatment of covid-19 pneumonia, received decadron, but did not require supplemental oxygen. She experienced visual hallucinations during that hospitalization which were resolved at discharge. Since discharge she reports difficulty taking deep breaths, chest pressure, anxiety, and progressive generalized weakness making transfers difficult. On evaluation in the ED she was not febrile nor hypoxic but did have CTA chest ruling out PE but demonstrating peripheral GGOs and apical septal thickening concerning for pulmonary edema. CRP <0.8, d-dimer 0.651L NS given and admission requested at [facility].   Assessment & Plan: Principal Problem:   Generalized weakness Active Problems:   Thyroid disease   Dystonia   Pneumonia due to COVID-19 virus   Elevated transaminase level   Hypertension   Chest pain  Covid-19 pneumonia: GGOs on CT chest though no hypoxia. Now nearly 2 weeks from initial diagnosis and has received steroids already. Inflammatory markers not elevated.  - Hold off on target therapy for now.  - Airborne/contact precautions - Trend labs.  - If hallucinations return, previous hospitalists had good results with thorazine.   Acute pulmonary edema: Lexiscan stress testing Aug 2020 showed LVEF 76% with normal wall motion, no reversible ischemia. BNP 119.6. Weight 74.4kg up from outpatient records showing 71kg at neurology visit 10/8. No peripheral edema. Gave lasix x1 with bump in BUN, Cr and has no crackles on exam. Do not feel echocardiography  would be helpful in acute context.  - No further diuresis needed, monitor pulmonary exam.   Generalized weakness on chronic dystonia: At high risk for protracted recovery from covid. No new focal deficits on exam. Has not tolerated sinemet in the past and PD not felt to be accurate Dx, and botox injections have not helped. Dx by neurology is primary freezing of gait. No acute CT head findings - PT/OT consulted. Will consult CSW as the only safe disposition for the time being is to SNF for rehabilitation.   Chest pressure: Troponin negative, recent ischemic evaluation negative, no ischemic ECG changes. Doubt this is cardiogenic.  - Continue to treat supportively. Optimize oxygenation. - Treat anxiety.   Sinus bradycardia: Consistently in 40's and pt complaining of weakness. Had coreg discontinued during last hospitalization. Reportedly had nonischemic stress testing 2 months ago. No AVB on ECG - Decreased coreg 6.25 > 3.125mg  BID. Consider discontinuing this pending HR trends.   - DC telemetry  Hypothyroidism: TSH wnl.  - Continue synthroid daily  LFT elevation: Mild ALT > AST, would be consistent with covid infection.  - Continue monitoring. AST normalized.  Depression, anxiety:  - Continue lexapro as pt reports taking this. - Start buspar 7.5mg  po BID. D/w patient that this is likely to take a while to help and may need dose increases pending tolerance, but that anxiety is likely playing a significant role in her symptoms. - Ordered prn alprazolam given anxiety's contribution to symptoms.   Dizziness: Not rotational vertigo, no nystagmus on exam. Not a new symptom.  - Avoid orthostasis.  - prn medications   HTN:  - Continue ARB. Normotensive, so will hold Triamterene-HCTZ   HLD:  -  Continue zetia 10mg , not on statin  Peripheral neuropathy: Idiopathic. Vitamin B12, B6, E, and MMA all wnl at recent check. HbA1c 5.6%  DVT prophylaxis: Lovenox Code Status: Full Family  Communication: None at bedside, pt able to convey POC Disposition Plan: SNF once bed available. CSW consulted. Note she reportedly declined this yesterday but has told me on several occasions she agrees to SNF.  Consultants:   None  Procedures:   None  Antimicrobials:  None   Subjective: Feels confused this morning but is able to state where she is, why, and events of yesterday and this morning. States "I know I'm talking to you, but it feels like I'm not even talking to you." Reiterates that she feels dizzy, severely so. This is difficult to describe, but has been present for at least 2 years, no changes, and denies nausea/vomiting or rotational vertigo. Wants to go home but says "I can't even stand up," and therefore agrees to going to SNF for rehabilitation prior to returning home. Remebers working with PT yesterday where she remained >93% on room air with mobility.  Objective: Vitals:   03/17/19 1913 03/18/19 0018 03/18/19 0307 03/18/19 0700  BP: (!) 88/60 97/69 100/77 115/64  Pulse: 75 69 (!) 58 (!) 58  Resp: 13 20 19 19   Temp: 98.3 F (36.8 C) 98.3 F (36.8 C) 98 F (36.7 C) 97.6 F (36.4 C)  TempSrc: Oral Oral Oral Oral  SpO2: 95% 90% 91% 91%  Weight:      Height:        Intake/Output Summary (Last 24 hours) at 03/18/2019 0837 Last data filed at 03/17/2019 1540 Gross per 24 hour  Intake 118 ml  Output 1200 ml  Net -1082 ml   Filed Weights   03/17/19 0133  Weight: 74.4 kg   Gen: 73 y.o. female in no distress, frazzled-appearing Pulm: Nonlabored breathing room air. Clear. CV: Regular rate and rhythm, rate 80's. No murmur, rub, or gallop. No JVD, no pitting dependent edema. GI: Abdomen soft, tenderness in LLQ that is mild without rebound or guarding, non-distended, with normoactive bowel sounds.  Ext: Warm, no deformities Skin: No rashes, lesions or ulcers on visualized skin. Neuro: Alert and oriented. No new focal neurological deficits. Psych: Judgement and  insight appear fair. Mood anxious & affect congruent. Behavior is appropriate. Not responding to internal stimuli  Data Reviewed: I have personally reviewed following labs and imaging studies  CBC: Recent Labs  Lab 03/16/19 1146 03/17/19 0450  WBC 7.9 6.5  NEUTROABS 5.7 4.4  HGB 15.0 13.4  HCT 43.1 39.4  MCV 98.6 96.8  PLT 351 341   Basic Metabolic Panel: Recent Labs  Lab 03/16/19 1146 03/17/19 0450 03/18/19 0145  NA 137 136 137  K 4.3 3.9 3.9  CL 103 104 102  CO2 22 24 25   GLUCOSE 123* 133* 93  BUN 34* 32* 40*  CREATININE 0.98 0.95 1.03*  CALCIUM 9.7 9.1 9.3  MG  --  2.1  --   PHOS  --  2.9  --    GFR: Estimated Creatinine Clearance: 47.4 mL/min (A) (by C-G formula based on SCr of 1.03 mg/dL (H)). Liver Function Tests: Recent Labs  Lab 03/16/19 1146 03/17/19 0450 03/18/19 0145  AST 46* 47* 37  ALT 54* 63* 65*  ALKPHOS 77 65 71  BILITOT 0.3 0.7 1.0  PROT 7.2 6.4* 6.5  ALBUMIN 3.5 3.1* 3.2*   No results for input(s): LIPASE, AMYLASE in the last 168 hours. No results for  input(s): AMMONIA in the last 168 hours. Coagulation Profile: No results for input(s): INR, PROTIME in the last 168 hours. Cardiac Enzymes: Recent Labs  Lab 03/16/19 2245  CKTOTAL 76   BNP (last 3 results) No results for input(s): PROBNP in the last 8760 hours. HbA1C: No results for input(s): HGBA1C in the last 72 hours. CBG: No results for input(s): GLUCAP in the last 168 hours. Lipid Profile: No results for input(s): CHOL, HDL, LDLCALC, TRIG, CHOLHDL, LDLDIRECT in the last 72 hours. Thyroid Function Tests: Recent Labs    03/18/19 0145  TSH 1.829   Anemia Panel: Recent Labs    03/17/19 0450  FERRITIN 741*   Urine analysis: No results found for: COLORURINE, APPEARANCEUR, LABSPEC, PHURINE, GLUCOSEU, HGBUR, BILIRUBINUR, KETONESUR, PROTEINUR, UROBILINOGEN, NITRITE, LEUKOCYTESUR No results found for this or any previous visit (from the past 240 hour(s)).    Radiology  Studies: Ct Angio Chest Pe W And/or Wo Contrast  Result Date: 03/16/2019 CLINICAL DATA:  Shortness of breath, COVID-19 positive. EXAM: CT ANGIOGRAPHY CHEST WITH CONTRAST TECHNIQUE: Multidetector CT imaging of the chest was performed using the standard protocol during bolus administration of intravenous contrast. Multiplanar CT image reconstructions and MIPs were obtained to evaluate the vascular anatomy. CONTRAST:  37mL OMNIPAQUE IOHEXOL 350 MG/ML SOLN COMPARISON:  CTA November 04, 2018 FINDINGS: Cardiovascular: Satisfactory opacification of the pulmonary arteries to the lobar level. More distal evaluation limited by respiratory motion and underlying parenchymal disease. No pulmonary artery filling defects are seen to the lobar level. Pulmonary arterial caliber is upper limits normal, unchanged from comparison. Atherosclerotic plaque of the nonaneurysmal thoracic aorta. Normal 3 vessel branching of the arch with scant calcification in the proximal great vessels. Mild cardiomegaly with biatrial enlargement. No pericardial effusion. No elevation of the RV/LV ratio (0.75). Mediastinum/Nodes: No enlarged mediastinal, hilar or axillary lymph nodes. Thyroid gland, trachea, and esophagus demonstrate no significant findings. Lungs/Pleura: Multifocal areas peripheral predominant ground-glass are present in the lungs. No pneumothorax or effusion. Mild septal thickening noted in the apices and bases. Diffuse airways thickening and scattered secretions. Upper Abdomen: Non rotation of both kidneys, anatomic variant. Partial fatty replacement of the pancreas. Slight reflux of contrast into the hepatic veins. Musculoskeletal: Multilevel degenerative changes are present in the imaged portions of the spine. No acute osseous abnormality or suspicious osseous lesion. No concerning chest wall lesions S-shaped curvature of the thoracolumbar spine vertebroplasty changes at L1, partially visualized. Review of the MIP images confirms the  above findings. IMPRESSION: 1. No evidence of pulmonary embolism to the lobar level. More distal evaluation limited by respiratory motion and underlying parenchymal disease. 2. Multifocal areas of peripheral predominant ground-glass in the lungs, compatible with atypical infection in the setting of known COVID-19 positivity. 3. Some septal thickening in the apices may reflect superimposed edema. 4. Aortic Atherosclerosis (ICD10-I70.0). Electronically Signed   By: Lovena Le M.D.   On: 03/16/2019 21:15   Dg Chest Portable 1 View  Result Date: 03/16/2019 CLINICAL DATA:  Initial evaluation for acute cough, weakness, OB positive. EXAM: PORTABLE CHEST 1 VIEW COMPARISON:  Prior radiograph from 11/11/2018. FINDINGS: Cardiac and mediastinal silhouettes within normal limits. Lungs mildly hypoinflated. Linear atelectatic changes noted within the right perihilar region, lingula, and left lung base. No other focal airspace disease. No edema or effusion. No pneumothorax. No acute osseous finding. IMPRESSION: 1. Mild scattered subsegmental atelectasis within the right perihilar region and left lung base. 2. No other radiographic evidence for active cardiopulmonary disease. Electronically Signed   By: Marland Kitchen  Phill Myron M.D.   On: 03/16/2019 19:15    Scheduled Meds:  benztropine  1 mg Oral BID   busPIRone  7.5 mg Oral BID   carvedilol  3.125 mg Oral BID WC   enoxaparin (LOVENOX) injection  40 mg Subcutaneous Q24H   ezetimibe  10 mg Oral Daily   irbesartan  37.5 mg Oral Daily   levothyroxine  75 mcg Oral Q0600   sodium chloride flush  3 mL Intravenous Q12H   Continuous Infusions:  sodium chloride       LOS: 1 day   Time spent: 25 minutes.  Tyrone Nine, MD [facility] [email or web address] 03/18/2019, 8:37 AM

## 2019-03-18 NOTE — TOC Initial Note (Signed)
Transition of Care [facility]) - Initial/Assessment Note    Patient Details  Name: Aniylah Morgan MRN: [id number] Date of Birth: 08/21/1945  Transition of Care [facility]) CM/SW Contact:    Ninfa Meeker, RN Phone Number: [phone] (working remotely) 03/18/2019, 2:51 PM  Clinical Narrative:                 Patient is a 73 y.o. female with a history of dystonia NOS, wheelchair-bound for 3.5 years, HTN, and depression who presented from home to the ED 10/28 with ongoing severe diffuse weakness. She was diagnosed with covid-19 10/16 and subsequently admitted to [facility] from 10/22 - 10/26 for treatment of covid-19 pneumonia. Patient had initially declined SNF. Case manager spoke with patient concerning options and she stated she knew she needed shortterm rehab. Case manager explained limited facility options, patient asked that [facility] be her first because she lives in [place]. Case manager has begun workup for SNF. FL2 completed.         Patient Goals and CMS Choice        Expected Discharge Plan and Services           Expected Discharge Date: 03/18/19                                    Prior Living Arrangements/Services                       Activities of Daily Living Home Assistive Devices/Equipment: Wheelchair ADL Screening (condition at time of admission) Patient's cognitive ability adequate to safely complete daily activities?: Yes Is the patient deaf or have difficulty hearing?: No Does the patient have difficulty seeing, even when wearing glasses/contacts?: No Does the patient have difficulty concentrating, remembering, or making decisions?: No Patient able to express need for assistance with ADLs?: Yes Does the patient have difficulty dressing or bathing?: Yes Independently performs ADLs?: No Communication: Independent Dressing (OT): Needs assistance Is this a change from baseline?: Pre-admission baseline Grooming: Needs assistance Is this a change  from baseline?: Pre-admission baseline Feeding: Independent Bathing: Needs assistance Is this a change from baseline?: Pre-admission baseline Toileting: Needs assistance Is this a change from baseline?: Pre-admission baseline In/Out Bed: Needs assistance Is this a change from baseline?: Pre-admission baseline Walks in Home: Needs assistance Is this a change from baseline?: Pre-admission baseline Does the patient have difficulty walking or climbing stairs?: Yes Weakness of Legs: Both Weakness of Arms/Hands: None  Permission Sought/Granted                  Emotional Assessment              Admission diagnosis:  Shortness of breath [R06.02] Weakness [R53.1] COVID-19 virus infection [U07.1] Patient Active Problem List   Diagnosis Date Noted  . Generalized weakness 03/16/2019  . Thyroid disease   . Dystonia   . Pneumonia due to COVID-19 virus   . Elevated transaminase level   . Hypertension   . Chest pain   . COVID-19 virus infection    PCP:  Karleen Hampshire., MD Pharmacy:   [facility], [place] - [address] [place] [address] Phone: [phone] Fax: [phone]     Social Determinants of Health (SDOH) Interventions    Readmission Risk Interventions No flowsheet data found.

## 2019-03-18 NOTE — Progress Notes (Signed)
Attempted to contact patient's family for update.  No answer, will try again later today.

## 2019-03-18 NOTE — Progress Notes (Signed)
Occupational Therapy Evaluation Patient Details Name: Debra Morgan MRN: [id number] DOB: 1946/02/22 Today's Date: 03/18/2019    History of Present Illness 73 y.o. female admitted on 03/16/19 for generalized weakness, fatigue, SOB, chest pressure.  Pt was recently hospitalized at [facility] from 10/22-10/26 for COVID 19 PNA and d/c home.  CT was negative for PE, suspicious for pulmonary edema, and typical GGO found with COVID 19.  Pt with PMH of HTN, dystonia.   Clinical Impression   PTA. Pt lived at home with her husband and required increased assistance with ADL and mobility since she has been sick. Pt states she has had multiple falls and is very fearful of falling during session. Pt agreeable to using Stedy in order to mobilize OOB to bathroom and recliner. Session completed on RA with SpO2 remaining above 90. BP 103/66 EOB; 99/68 on toilet; 125/79 at end of session in recliner. HR 96. Pt complaining of being dizzy and feeling like she is "dying". Pt discussed her problems with anxiety PTA. Listened to pt's concerns and encouraged pt to participate with rehab to get stronger and more independent with self care. Pt complaining of R calf pain - MD and nsg notified. Recommend SNF for rehab. Will follow acutely.     Follow Up Recommendations  SNF;Supervision/Assistance - 24 hour    Equipment Recommendations       Recommendations for Other Services       Precautions / Restrictions Precautions Precautions: Fall Precaution Comments: h/o falls due to dystonia and R LE locking up      Mobility Bed Mobility Overal bed mobility: Modified Independent             General bed mobility comments: Pt rolled bil and came to sitting EOB with use of bed rails.    Transfers Overall transfer level: Needs assistance   Transfers: Sit to/from Stand;Stand Pivot Transfers Sit to Stand: Min assist Stand pivot transfers: Min assist            Balance Overall balance assessment: Needs  assistance Sitting-balance support: Feet unsupported;Bilateral upper extremity supported Sitting balance-Leahy Scale: Fair       Standing balance-Leahy Scale: Poor                             ADL either performed or assessed with clinical judgement   ADL Overall ADL's : Needs assistance/impaired Eating/Feeding: Independent   Grooming: Set up;Sitting   Upper Body Bathing: Set up;Sitting   Lower Body Bathing: Moderate assistance;Sit to/from stand   Upper Body Dressing : Moderate assistance   Lower Body Dressing: Moderate assistance;Sit to/from stand   Toilet Transfer: Minimal assistance;Regular Toilet(use of Stedy)   Toileting- Water quality scientist and Hygiene: Moderate assistance;Sit to/from stand       Functional mobility during ADLs: Minimal assistance(with useof Stedy) General ADL Comments: Pt fearful of falling; "I Can't". Stedy used to mobilize totoilet then to [employer] Baseline Vision/History: Wears Presenter, broadcasting      Pertinent Vitals/Pain Pain Assessment: Faces Faces Pain Scale: Hurts little more Pain Location: joint discomfort R LE - calf since COVID Pain Descriptors / Indicators: Aching Pain Intervention(s): Limited activity within patient's tolerance     Hand Dominance Right   Extremity/Trunk Assessment Upper Extremity Assessment Upper Extremity Assessment: Generalized weakness   Lower Extremity Assessment Lower Extremity Assessment: Defer to PT evaluation RLE Deficits / Details: Right  leg is weaker, more stiff, with less ankle ROM compared to left.  Grossly 3-/5 with in bed testing, in sitting R LE stays extended at the knee despite being able to flex it in the bed.   RLE Sensation: decreased light touch;history of peripheral neuropathy LLE Deficits / Details: Stronger than R LE 3/5 per bed level testing.  LLE Sensation: decreased light touch;history of peripheral neuropathy   Cervical / Trunk  Assessment Cervical / Trunk Assessment: Other exceptions Cervical / Trunk Exceptions: per pt report h/o kyphoplasty after a fall 3 yrs ago   Communication Communication Communication: No difficulties   Cognition Arousal/Alertness: Awake/alert Behavior During Therapy: Anxious(very, "I feel like I am dying") Overall Cognitive Status: Within Functional Limits for tasks assessed                                     General Comments   Pt issued activities to work on to assist in dealing with anxiety. Disucssed possible use of anxiety meds with MD    Exercises Exercises: Other exercises Other Exercises Other Exercises: incentive spirometer (pulls 500 ml) x 10FLUTTER VALVE x 10   Shoulder Instructions      Home Living Family/patient expects to be discharged to:: Private residence Living Arrangements: Spouse/significant other Available Help at Discharge: Family;Available 24 hours/day Type of Home: House Home Access: Ramped entrance           Bathroom Shower/Tub: (Pt bathes at the sink, standing to wash her hair)   Bathroom Toilet: Standard Bathroom Accessibility: Yes How Accessible: Accessible via walker Home Equipment: Walker - 4 wheels;Wheelchair - manual(transport WC)          Prior Functioning/Environment Level of Independence: Independent with assistive device(s);Needs assistance  Gait / Transfers Assistance Needed: Pt reports she is normally independent with transfers to her WC, toilet, uses feet to mobilize around the house. ADL's / Homemaking Assistance Needed: does her own bathing, dressing, toileting, no longer gets in the shower.    Comments: does not wear glasses, has had cataract surgery        OT Problem List: Pain      OT Treatment/Interventions: Self-care/ADL training;Therapeutic exercise;Neuromuscular education;Energy conservation;DME and/or AE instruction;Therapeutic activities;Visual/perceptual remediation/compensation;Patient/family  education    OT Goals(Current goals can be found in the care plan section) Acute Rehab OT Goals Patient Stated Goal: to feel better, breathe better OT Goal Formulation: With patient Time For Goal Achievement: 04/01/19 Potential to Achieve Goals: Good  OT Frequency: Min 2X/week   Barriers to D/C:            Co-evaluation              AM-PAC OT "6 Clicks" Daily Activity     Outcome Measure Help from another person eating meals?: None Help from another person taking care of personal grooming?: A Little Help from another person toileting, which includes using toliet, bedpan, or urinal?: A Lot Help from another person bathing (including washing, rinsing, drying)?: A Lot Help from another person to put on and taking off regular upper body clothing?: A Little Help from another person to put on and taking off regular lower body clothing?: A Little 6 Click Score: 17   End of Session Equipment Utilized During Treatment: Gait belt;Other (comment)(sTEDY) Nurse Communication: Mobility status  Activity Tolerance: Patient tolerated treatment well Patient left: in chair;with call bell/phone within reach;with chair alarm set  OT Visit Diagnosis: Unsteadiness  on feet (R26.81);Other abnormalities of gait and mobility (R26.89);Muscle weakness (generalized) (M62.81);Dizziness and giddiness (R42)                Time: 3474-2595 OT Time Calculation (min): 55 min Charges:  OT General Charges $OT Visit: 1 Visit OT Evaluation $OT Eval Moderate Complexity: 1 Mod OT Treatments $Self Care/Home Management : 38-52 mins  Luisa Dago, OT/L   Acute OT Clinical Specialist Acute Rehabilitation Services Pager [phone] Office [phone]   [facility] 03/18/2019, 2:13 PM

## 2019-03-18 NOTE — [place] FL2 (Signed)
[place] MEDICAID FL2 LEVEL OF CARE SCREENING TOOL     IDENTIFICATION  Patient Name: Debra Morgan Birthdate: [birth year] Sex: female Admission Date (Current Location): 03/16/2019  [facility] and [place] Number:  Producer, television/film/video and Address:  [facility], [address], [place], [place] [address]([address]      Provider Number: [id number]  Attending Physician Name and Address:  Tyrone Nine, MD  Relative Name and Phone Number:  daughter:   Nini Cavan [facility]   [phone]    Current Level of Care: Hospital Recommended Level of Care: Skilled Nursing Facility Prior Approval Number:    Date Approved/Denied:   PASRR Number: [id number]  Discharge Plan: SNF    Current Diagnoses: Patient Active Problem List   Diagnosis Date Noted  . Generalized weakness 03/16/2019  . Thyroid disease   . Dystonia   . Pneumonia due to COVID-19 virus   . Elevated transaminase level   . Hypertension   . Chest pain   . COVID-19 virus infection     Orientation RESPIRATION BLADDER Height & Weight     Self, Time, Situation, Place  Normal Continent Weight: 74.4 kg Height:  5\' 4"  (162.6 cm)  BEHAVIORAL SYMPTOMS/MOOD NEUROLOGICAL BOWEL NUTRITION STATUS      Continent Diet  AMBULATORY STATUS COMMUNICATION OF NEEDS Skin   Limited Assist Verbally Normal                       Personal Care Assistance Level of Assistance              Functional Limitations Info             SPECIAL CARE FACTORS FREQUENCY  PT (By licensed PT), OT (By licensed OT)     PT Frequency: 5x/week OT Frequency: min 3x/week            Contractures Contractures Info: Not present    Additional Factors Info  Code Status, Allergies Code Status Info: Full Allergies Info: Sinement, codeine-palpitations, Tetracyclines-rash           Current Medications (03/18/2019):  This is the current hospital active medication list Current Facility-Administered  Medications  Medication Dose Route Frequency Provider Last Rate Last Dose  . 0.9 %  sodium chloride infusion  250 mL Intravenous PRN Opyd, 03/20/2019, MD      . acetaminophen (TYLENOL) tablet 650 mg  650 mg Oral Q6H PRN Opyd, Lavone Neri, MD      . ALPRAZolam Lavone Neri) tablet 0.25 mg  0.25 mg Oral BID PRN Prudy Feeler, MD   0.25 mg at 03/18/19 1351  . benztropine (COGENTIN) tablet 1 mg  1 mg Oral BID Opyd, 03/20/19, MD   1 mg at 03/18/19 1100  . busPIRone (BUSPAR) tablet 7.5 mg  7.5 mg Oral BID 03/20/19 B, MD   7.5 mg at 03/18/19 1100  . carvedilol (COREG) tablet 3.125 mg  3.125 mg Oral BID WC 03/20/19 B, MD   3.125 mg at 03/18/19 1100  . enoxaparin (LOVENOX) injection 40 mg  40 mg Subcutaneous Q24H Opyd, 03/20/19, MD   40 mg at 03/18/19 1100  . escitalopram (LEXAPRO) tablet 10 mg  10 mg Oral Daily 03/20/19, MD   10 mg at 03/18/19 1300  . ezetimibe (ZETIA) tablet 10 mg  10 mg Oral Daily 03/20/19, MD   10 mg at 03/18/19 1100  . irbesartan (AVAPRO) tablet 37.5 mg  37.5 mg Oral Daily Opyd, Ilene Qua, MD   37.5 mg at 03/18/19 1100  . levothyroxine (SYNTHROID) tablet 75 mcg  75 mcg Oral Q0600 Vianne Bulls, MD   75 mcg at 03/18/19 0542  . ondansetron (ZOFRAN) tablet 4 mg  4 mg Oral Q6H PRN Opyd, Ilene Qua, MD       Or  . ondansetron (ZOFRAN) injection 4 mg  4 mg Intravenous Q6H PRN Opyd, Ilene Qua, MD      . senna-docusate (Senokot-S) tablet 1 tablet  1 tablet Oral QHS PRN Opyd, Ilene Qua, MD   1 tablet at 03/17/19 1126  . sodium chloride flush (NS) 0.9 % injection 3 mL  3 mL Intravenous Q12H Opyd, Ilene Qua, MD   3 mL at 03/18/19 1100  . sodium chloride flush (NS) 0.9 % injection 3 mL  3 mL Intravenous PRN Opyd, Ilene Qua, MD         Discharge Medications: Please see discharge summary for a list of discharge medications.  Relevant Imaging Results:  Relevant Lab Results:   Additional Information SSN: [id number]  Ninfa Meeker, RN

## 2019-03-19 LAB — COMPREHENSIVE METABOLIC PANEL
ALT: 52 U/L — ABNORMAL HIGH (ref 0–44)
AST: 29 U/L (ref 15–41)
Albumin: 3.1 g/dL — ABNORMAL LOW (ref 3.5–5.0)
Alkaline Phosphatase: 74 U/L (ref 38–126)
Anion gap: 11 (ref 5–15)
BUN: 46 mg/dL — ABNORMAL HIGH (ref 8–23)
CO2: 26 mmol/L (ref 22–32)
Calcium: 9.7 mg/dL (ref 8.9–10.3)
Chloride: 99 mmol/L (ref 98–111)
Creatinine, Ser: 1.02 mg/dL — ABNORMAL HIGH (ref 0.44–1.00)
GFR calc Af Amer: >60 mL/min (ref >60)
GFR calc non Af Amer: 54 mL/min — ABNORMAL LOW (ref >60)
Glucose, Bld: 106 mg/dL — ABNORMAL HIGH (ref 70–99)
Potassium: 3.9 mmol/L (ref 3.5–5.1)
Sodium: 136 mmol/L (ref 135–145)
Total Bilirubin: 0.5 mg/dL (ref 0.3–1.2)
Total Protein: 6.5 g/dL (ref 6.5–8.1)

## 2019-03-19 NOTE — Progress Notes (Addendum)
PROGRESS NOTE  Debra Morgan  [id number]:[id number] DOB: 11/03/1945 DOA: 03/16/2019 PCP: Laqueta Due., MD  Outpatient Specialists: [facility], Dr. Armandina Stammer Brief Narrative: Debra Morgan is a 73 y.o. female with a history of dystonia NOS, wheelchair-bound for 3.5 years, HTN, and depression who presented from home to the ED 10/28 with ongoing severe diffuse weakness. She was diagnosed with covid-19 10/16 and subsequently admitted to [facility] from 10/22 - 10/26 for treatment of covid-19 pneumonia, received decadron, but did not require supplemental oxygen. She experienced visual hallucinations during that hospitalization which were resolved at discharge. Since discharge she reports difficulty taking deep breaths, chest pressure, anxiety, and progressive generalized weakness making transfers difficult. On evaluation in the ED she was not febrile nor hypoxic but did have CTA chest ruling out PE but demonstrating peripheral GGOs and apical septal thickening concerning for pulmonary edema. CRP <0.8, d-dimer 0.651L NS given and admission requested at [facility].   Assessment & Plan: Principal Problem:   Generalized weakness Active Problems:   Thyroid disease   Dystonia   Pneumonia due to COVID-19 virus   Elevated transaminase level   Hypertension   Chest pain  Covid-19 pneumonia: GGOs on CT chest though no hypoxia. Now nearly 2 weeks from initial diagnosis and has received steroids already. Inflammatory markers not elevated. CRP and PCT undetectable.  - Hold off on target therapy for now.  - Airborne/contact precautions - Trend labs.  - If hallucinations return, previous hospitalists had good results with thorazine. Fortunately, has not recurred.  Acute pulmonary edema: Lexiscan stress testing Aug 2020 showed LVEF 76% with normal wall motion, no reversible ischemia. BNP 119.6. Weight 74.4kg up from outpatient records showing 71kg at neurology visit 10/8. No peripheral edema. Gave lasix x1 with bump in BUN, Cr and  has no crackles on exam. Do not feel echocardiography would be helpful in acute context.  - No further diuresis needed, monitor pulmonary exam.   Generalized weakness on chronic dystonia: At high risk for protracted recovery from covid. No new focal deficits on exam. Has not tolerated sinemet in the past and PD not felt to be accurate Dx, and botox injections have not helped. Dx by neurology is primary freezing of gait. No acute CT head findings. Tn and CK wnl.  - PT/OT consulted. Will consult CSW as the only safe disposition for the time being is to SNF for rehabilitation.   D-dimer elevation: Modest, and actually below age-adjusted cutoff. Has been receiving 40mg  q24h of lovenox. Now has some right calf compression tenderness which is reportedly new. With covid and immobility/hospitalizations, she remains at risk of VTE.  - Check LE U/S to r/o DVT.  Chest pressure: Troponin negative, recent ischemic evaluation negative, no ischemic ECG changes. Doubt this is cardiogenic.  - Continue to treat supportively. Optimize oxygenation. - Treat anxiety.   Sinus bradycardia: Consistently in 40's and pt complaining of weakness. Had coreg discontinued during last hospitalization. Reportedly had nonischemic stress testing 2 months ago. No AVB on ECG - Decreased coreg 6.25 > 3.125mg  BID with improvement.  Hypothyroidism: TSH wnl.  - Continue synthroid daily  LFT elevation: Mild ALT > AST, would be consistent with covid infection.  - Continue monitoring. AST normalized. ALT improving.  Depression, anxiety:  - Continue lexapro as pt reports taking this. - Started buspar 7.5mg  po BID. D/w patient that this is likely to take a while to help and may need dose increases pending tolerance, but that anxiety is likely playing a significant role in  her symptoms. - Ordered prn alprazolam given anxiety's contribution to symptoms but 0.25mg  po dose caused mental clouding so this was stopped.   Dizziness: Not  rotational vertigo, no nystagmus on exam. Not a new symptom.  - Avoid orthostasis.  - prn medications   HTN:  - Continue ARB. Normotensive, so will hold Triamterene-HCTZ   HLD:  - Continue zetia 10mg , not on statin  Peripheral neuropathy: Idiopathic. Vitamin B12, B6, E, and MMA all wnl at recent check. HbA1c 5.6%  DVT prophylaxis: Lovenox Code Status: Full Family Communication: None at bedside, spoke with daughter yesterday, will call again today.  Disposition Plan: SNF once bed available and insurance authorized.  Consultants:   None  Procedures:   None  Antimicrobials:  None   Subjective: Continues to feel generally lousy. States she's too weak to get up and was in the chair yesterday for 3 hours which was "miserable." Her right calf hurts, still feels short of breath even at rest. Very depressed about her incapacity currently. Wants to go home. Became very foggy headed with xanax yesterday.   Objective: Vitals:   03/18/19 2000 03/19/19 0500 03/19/19 0515 03/19/19 0800  BP: 106/69  (!) 102/59 121/83  Pulse:   71 60  Resp: 15  17 18   Temp: (!) 97.5 F (36.4 C)  98.2 F (36.8 C) (!) 97.5 F (36.4 C)  TempSrc: Oral  Oral Oral  SpO2:   94% 93%  Weight:  73.9 kg    Height:        Intake/Output Summary (Last 24 hours) at 03/19/2019 0855 Last data filed at 03/19/2019 0600 Gross per 24 hour  Intake 423 ml  Output --  Net 423 ml   Filed Weights   03/17/19 0133 03/19/19 0500  Weight: 74.4 kg 73.9 kg   Gen: Tired-appearing female in no distress Pulm: Nonlabored breathing room air. Clear. CV: Regular rate and rhythm. No murmur, rub, or gallop. No JVD, no dependent edema. GI: Abdomen soft, non-tender, non-distended, with normoactive bowel sounds.  Ext: Warm, no deformities, tenderness on right calf compression, neg homan's. Skin: No rashes, lesions or ulcers on visualized skin. Neuro: Alert and oriented. Dystonic right foot, stable. Rolling toes bilaterally  unchanged. No rigidity or tremor. No new focal neurological deficits. Psych: Judgement and insight appear fair. Mood depressed and anxious & affect congruent. Behavior is appropriate. No AVH.  Data Reviewed: I have personally reviewed following labs and imaging studies  CBC: Recent Labs  Lab 03/16/19 1146 03/17/19 0450  WBC 7.9 6.5  NEUTROABS 5.7 4.4  HGB 15.0 13.4  HCT 43.1 39.4  MCV 98.6 96.8  PLT 351 341   Basic Metabolic Panel: Recent Labs  Lab 03/16/19 1146 03/17/19 0450 03/18/19 0145 03/19/19 0010  NA 137 136 137 136  K 4.3 3.9 3.9 3.9  CL 103 104 102 99  CO2 22 24 25 26   GLUCOSE 123* 133* 93 106*  BUN 34* 32* 40* 46*  CREATININE 0.98 0.95 1.03* 1.02*  CALCIUM 9.7 9.1 9.3 9.7  MG  --  2.1  --   --   PHOS  --  2.9  --   --    GFR: Estimated Creatinine Clearance: 47.7 mL/min (A) (by C-G formula based on SCr of 1.02 mg/dL (H)). Liver Function Tests: Recent Labs  Lab 03/16/19 1146 03/17/19 0450 03/18/19 0145 03/19/19 0010  AST 46* 47* 37 29  ALT 54* 63* 65* 52*  ALKPHOS 77 65 71 74  BILITOT 0.3 0.7 1.0 0.5  PROT 7.2 6.4* 6.5 6.5  ALBUMIN 3.5 3.1* 3.2* 3.1*   Cardiac Enzymes: Recent Labs  Lab 03/16/19 2245  CKTOTAL 76   Thyroid Function Tests: Recent Labs    03/18/19 0145  TSH 1.829   Anemia Panel: Recent Labs    03/17/19 0450  FERRITIN 741*   Scheduled Meds:  benztropine  1 mg Oral BID   busPIRone  7.5 mg Oral BID   carvedilol  3.125 mg Oral BID WC   enoxaparin (LOVENOX) injection  40 mg Subcutaneous Q24H   escitalopram  10 mg Oral Daily   ezetimibe  10 mg Oral Daily   irbesartan  37.5 mg Oral Daily   levothyroxine  75 mcg Oral Q0600   sodium chloride flush  3 mL Intravenous Q12H   Continuous Infusions:  sodium chloride       LOS: 2 days   Time spent: 25 minutes.  Patrecia Pour, MD [facility] [email or web address] 03/19/2019, 8:55 AM

## 2019-03-20 ENCOUNTER — Inpatient Hospital Stay (HOSPITAL_COMMUNITY): Payer: Medicare Other

## 2019-03-20 DIAGNOSIS — R7989 Other specified abnormal findings of blood chemistry: Secondary | ICD-10-CM

## 2019-03-20 LAB — COMPREHENSIVE METABOLIC PANEL
ALT: 40 U/L (ref 0–44)
AST: 22 U/L (ref 15–41)
Albumin: 3.1 g/dL — ABNORMAL LOW (ref 3.5–5.0)
Alkaline Phosphatase: 65 U/L (ref 38–126)
Anion gap: 12 (ref 5–15)
BUN: 38 mg/dL — ABNORMAL HIGH (ref 8–23)
CO2: 25 mmol/L (ref 22–32)
Calcium: 9.4 mg/dL (ref 8.9–10.3)
Chloride: 101 mmol/L (ref 98–111)
Creatinine, Ser: 0.93 mg/dL (ref 0.44–1.00)
GFR calc Af Amer: >60 mL/min (ref >60)
GFR calc non Af Amer: >60 mL/min (ref >60)
Glucose, Bld: 85 mg/dL (ref 70–99)
Potassium: 3.8 mmol/L (ref 3.5–5.1)
Sodium: 138 mmol/L (ref 135–145)
Total Bilirubin: 0.5 mg/dL (ref 0.3–1.2)
Total Protein: 6.3 g/dL — ABNORMAL LOW (ref 6.5–8.1)

## 2019-03-20 NOTE — Progress Notes (Signed)
Bilateral lower extremity venous duplex has been completed. Preliminary results can be found in CV Proc through chart review.   03/20/19 9:25 AM Debra Morgan RVT

## 2019-03-20 NOTE — Progress Notes (Signed)
PROGRESS NOTE  Debra Morgan  ZOX:[id number] DOB: 1945/08/17 DOA: 03/16/2019 PCP: Karleen Hampshire., MD  Outpatient Specialists: [facility], Dr. Tillman Sers Brief Narrative: Debra Morgan is a 73 y.o. female with a history of dystonia NOS, wheelchair-bound for 3.5 years, HTN, and depression who presented from home to the ED 10/28 with ongoing severe diffuse weakness. She was diagnosed with covid-19 10/16 and subsequently admitted to [facility] from 10/22 - 10/26 for treatment of covid-19 pneumonia, received decadron, but did not require supplemental oxygen. She experienced visual hallucinations during that hospitalization which were resolved at discharge. Since discharge she reports difficulty taking deep breaths, chest pressure, anxiety, and progressive generalized weakness making transfers difficult. On evaluation in the ED she was not febrile nor hypoxic but did have CTA chest ruling out PE but demonstrating peripheral GGOs and apical septal thickening concerning for pulmonary edema. CRP <0.8, d-dimer 0.651L NS given and admission requested at [facility].   Assessment & Plan: Principal Problem:   Generalized weakness Active Problems:   Thyroid disease   Dystonia   Pneumonia due to COVID-19 virus   Elevated transaminase level   Hypertension   Chest pain  Covid-19 pneumonia: GGOs on CT chest though no hypoxia. Now nearly 2 weeks from initial diagnosis and has received steroids already. Inflammatory markers not elevated. CRP and PCT undetectable.  - Hold off on target therapy for now.  - Airborne/contact precautions - Trend labs.  - If hallucinations return, previous hospitalists had good results with thorazine. Fortunately, has not recurred.  Acute pulmonary edema: Lexiscan stress testing Aug 2020 showed LVEF 76% with normal wall motion, no reversible ischemia. BNP 119.6. Weight 74.4kg up from outpatient records showing 71kg at neurology visit 10/8. No peripheral edema. Gave lasix x1 with bump in BUN, Cr and  has no crackles on exam. Do not feel echocardiography would be helpful in acute context.  - No further diuresis needed, monitor pulmonary exam.   Generalized weakness on chronic dystonia: At high risk for protracted recovery from covid. No new focal deficits on exam. Has not tolerated sinemet in the past and PD not felt to be accurate Dx, and botox injections have not helped. Dx by neurology is primary freezing of gait. No acute CT head findings. Tn and CK wnl.  - PT/OT consulted. Will consult CSW as the only safe disposition for the time being is to SNF for rehabilitation.   D-dimer elevation: Modest, and actually below age-adjusted cutoff. Has been receiving 40mg  q24h of lovenox. Now has some right calf compression tenderness which is reportedly new. With covid and immobility/hospitalizations, she remains at risk of VTE.  - Check LE U/S to r/o DVT. Still pending.   Chest pressure: Troponin negative, recent ischemic evaluation negative, no ischemic ECG changes. Doubt this is cardiogenic.  - Continue to treat supportively. Optimize oxygenation. - Treat anxiety.   Sinus bradycardia: Consistently in 40's and pt complaining of weakness. Had coreg discontinued during last hospitalization. Reportedly had nonischemic stress testing 2 months ago. No AVB on ECG - Decreased coreg 6.25 > 3.125mg  BID with improvement.  Hypothyroidism: TSH wnl.  - Continue synthroid 15mcg daily  LFT elevation: Mild ALT > AST, would be consistent with covid infection.  - Normalized. No longer checking.   Depression, anxiety:  - Continue lexapro as pt reports taking this. - Started buspar 7.5mg  po BID. D/w patient that this is likely to take a while to help and may need dose increases pending tolerance, but that anxiety is likely playing a significant  role in her symptoms. - Would avoid any other psychotropic medications due to poor tolerance.   Dizziness: Not rotational vertigo, no nystagmus on exam. This is chronic.   - Avoid orthostasis.  - prn medications   HTN:  - Continue ARB. Can restart home medications at discharge.   HLD:  - Continue zetia 10mg , not on statin  Peripheral neuropathy: Idiopathic. Vitamin B12, B6, E, and MMA all wnl at recent check. HbA1c 5.6%  DVT prophylaxis: Lovenox Code Status: Full Family Communication: None at bedside, speaking with pt's daughter daily, now pt wants me to call her husband.  Disposition Plan: SNF once bed available and insurance authorized. Medically stable. Pt agrees to remain inpatient an additional day.    Consultants:   None  Procedures:   None  Antimicrobials:  None   Subjective: Says she's going home today because of how difficult it is to be here. Also says she almost pulled the staff member to the ground because she needed so much assistance standing from chair. She is not able to get around alone and is no where near her premorbid functional baseline. No new focal deficits. Continues to have severe dizziness. No nausea, vomiting. Chest discomfort is unchanged.  Objective: Vitals:   03/19/19 1927 03/20/19 0442 03/20/19 0444 03/20/19 0756  BP: 120/70  119/69 140/85  Pulse: 69  (!) 56 76  Resp: 18  16 20   Temp: (!) 97.5 F (36.4 C)  98 F (36.7 C) 97.8 F (36.6 C)  TempSrc: Oral  Oral Oral  SpO2: 91%  94% 95%  Weight:  74 kg    Height:        Intake/Output Summary (Last 24 hours) at 03/20/2019 1043 Last data filed at 03/20/2019 0941 Gross per 24 hour  Intake 360 ml  Output 400 ml  Net -40 ml   Filed Weights   03/17/19 0133 03/19/19 0500 03/20/19 0442  Weight: 74.4 kg 73.9 kg 74 kg   Gen: 73 y.o. female in no distress Pulm: Nonlabored breathing room air. Clear. CV: Regular rate and rhythm. No murmur, rub, or gallop. No JVD, no dependent edema. GI: Abdomen soft, non-tender, non-distended, with normoactive bowel sounds.  Ext: Warm, no deformities Skin: No rashes, lesions or ulcers on visualized skin Neuro: Alert and  oriented. No nystagmus during reported dizziness. No dysarthria.  Psych: Judgement and insight appear impaired, depressed and anxious mood with broad affect. No AVH.  Gen: Tired-appearing female in no distress Pulm: Nonlabored breathing room air. Clear. CV: Regular rate and rhythm. No murmur, rub, or gallop. No JVD, no dependent edema. GI: Abdomen soft, non-tender, non-distended, with normoactive bowel sounds.  Ext: Warm, no deformities, tenderness on right calf compression, neg homan's. Skin: No rashes, lesions or ulcers on visualized skin. Neuro: Alert and oriented. Dystonic right foot, stable. Rolling toes bilaterally unchanged. No rigidity or tremor. No new focal neurological deficits. Psych: Judgement and insight appear fair. Mood depressed and anxious & affect congruent. Behavior is appropriate. No AVH.  Time spent: 15 minutes.  13/01/20, MD [facility] [email or web address] 03/20/2019, 10:43 AM

## 2019-03-21 DIAGNOSIS — F331 Major depressive disorder, recurrent, moderate: Secondary | ICD-10-CM | POA: Diagnosis present

## 2019-03-21 DIAGNOSIS — F411 Generalized anxiety disorder: Secondary | ICD-10-CM

## 2019-03-21 NOTE — Consult Note (Addendum)
Telepsych Consultation   Reason for Consult:  Depression and anxiety Referring Physician:  Dr Bonner Puna Location of Patient: [facility] Location of Provider: [facility]  Patient Identification: Debra Morgan MRN:  [id number] Principal Diagnosis: Generalized weakness Diagnosis:  Principal Problem:   Generalized weakness Active Problems:   Major depressive disorder, recurrent episode, moderate (HCC)   Thyroid disease   Dystonia   Pneumonia due to COVID-19 virus   Elevated transaminase level   Hypertension   Chest pain   Total Time spent with patient: 1 hour  Subjective:   Debra Morgan is a 73 y.o. female patient admitted with COVID symptoms, consult placed for depression and anxiety.  "I cannot walk or get up."  Patient assessed via phone related to issues with iPad.  Patient engaged easily in conversation and endorses a fluctuating depression between 8 and 10 with passive intermittent suicidal ideations but no plan or intent, contracts for safety.  Frustrated with her battle with dystonia for the past 2 years and recent inability to walk or do anything physically but lying in bed.  Appetite is decreased and sleep is good with some bad dreams, denies any past traumatic experiences.  She does feel she will need "around the clock care" when discharged home.  Discussed medication options and would like to increase her Lexapro 10 mg as she has been on for 10 years.  HPI per MD:  Debra Morgan is a 73 y.o. female with medical history significant for hypothyroidism, hypertension, movement disorder followed by neurology at [facility], and COVID-19 with recent hospital admission, now presenting to the emergency department for evaluation of generalized weakness, chest pressure, and ongoing cough and shortness of breath.  Patient reports developing nonproductive cough and sore throat approximately 2 weeks ago, tested positive for COVID-19 on 03/04/2019 (see Care Everywhere), developed  hallucinations and worsening cough and dyspnea that prompted hospitalization on 03/10/2019.  Patient never required supplemental oxygen during the hospitalization, her hallucinations resolved, she was treated with Decadron, and returned home on 03/14/2019.  Since the discharge, she reports no improvement in her cough and dyspnea, has chest pressure described as though someone is sitting on her chest, and has become increasingly weak in general.  Patient reports that she is wheelchair-bound at baseline but is usually able to help transfer between bed and chair.  She reports feeling too weak in general to do much of anything at this point.  Past Psychiatric History: depression, anxiety  Risk to Self:  none Risk to Others:  none Prior Inpatient Therapy:  denies Prior Outpatient Therapy:  yes  Past Medical History:  Past Medical History:  Diagnosis Date  . Dystonia   . High cholesterol   . Hypertension   . Thyroid disease     Past Surgical History:  Procedure Laterality Date  . TUBAL LIGATION     Family History: History reviewed. No pertinent family history. Family Psychiatric  History: none Social History:  Social History   Substance and Sexual Activity  Alcohol Use Yes   Comment: social drinker, infrequent     Social History   Substance and Sexual Activity  Drug Use Never    Social History   Socioeconomic History  . Marital status: Married    Spouse name: Not on file  . Number of children: Not on file  . Years of education: Not on file  . Highest education level: Not on file  Occupational History  . Not on file  Social Needs  . Financial resource strain:  Not on file  . Food insecurity    Worry: Not on file    Inability: Not on file  . Transportation needs    Medical: Not on file    Non-medical: Not on file  Tobacco Use  . Smoking status: Never Smoker  . Smokeless tobacco: Never Used  Substance and Sexual Activity  . Alcohol use: Yes    Comment: social drinker,  infrequent  . Drug use: Never  . Sexual activity: Not on file  Lifestyle  . Physical activity    Days per week: Not on file    Minutes per session: Not on file  . Stress: Not on file  Relationships  . Social Musician on phone: Not on file    Gets together: Not on file    Attends religious service: Not on file    Active member of club or organization: Not on file    Attends meetings of clubs or organizations: Not on file    Relationship status: Not on file  Other Topics Concern  . Not on file  Social History Narrative  . Not on file   Additional Social History:    Allergies:   Allergies  Allergen Reactions  . Sinemet [Carbidopa W-Levodopa] Other (See Comments)    Feels "jittery"  . Codeine Palpitations  . Tetracyclines & Related Rash    Labs:  Results for orders placed or performed during the hospital encounter of 03/16/19 (from the past 48 hour(s))  Comprehensive metabolic panel     Status: Abnormal   Collection Time: 03/20/19 12:05 AM  Result Value Ref Range   Sodium 138 135 - 145 mmol/L   Potassium 3.8 3.5 - 5.1 mmol/L   Chloride 101 98 - 111 mmol/L   CO2 25 22 - 32 mmol/L   Glucose, Bld 85 70 - 99 mg/dL   BUN 38 (H) 8 - 23 mg/dL   Creatinine, Ser 4.65 0.44 - 1.00 mg/dL   Calcium 9.4 8.9 - 68.1 mg/dL   Total Protein 6.3 (L) 6.5 - 8.1 g/dL   Albumin 3.1 (L) 3.5 - 5.0 g/dL   AST 22 15 - 41 U/L   ALT 40 0 - 44 U/L   Alkaline Phosphatase 65 38 - 126 U/L   Total Bilirubin 0.5 0.3 - 1.2 mg/dL   GFR calc non Af Amer >60 >60 mL/min   GFR calc Af Amer >60 >60 mL/min   Anion gap 12 5 - 15    Comment: Performed at [facility], [address], [place], [place] [address]    Medications:  Current Facility-Administered Medications  Medication Dose Route Frequency Provider Last Rate Last Dose  . 0.9 %  sodium chloride infusion  250 mL Intravenous PRN Opyd, Lavone Neri, MD      . acetaminophen (TYLENOL) tablet 650 mg  650 mg Oral Q6H PRN  Briscoe Deutscher, MD   650 mg at 03/19/19 0017  . benztropine (COGENTIN) tablet 1 mg  1 mg Oral BID Opyd, Lavone Neri, MD   1 mg at 03/21/19 0951  . busPIRone (BUSPAR) tablet 7.5 mg  7.5 mg Oral BID Hazeline Junker B, MD   7.5 mg at 03/21/19 0951  . carvedilol (COREG) tablet 3.125 mg  3.125 mg Oral BID WC Tyrone Nine, MD   3.125 mg at 03/21/19 4944  . enoxaparin (LOVENOX) injection 40 mg  40 mg Subcutaneous Q24H Opyd, Lavone Neri, MD   40 mg at 03/21/19 0952  .  escitalopram (LEXAPRO) tablet 10 mg  10 mg Oral Daily Tyrone Nine, MD   10 mg at 03/21/19 0951  . ezetimibe (ZETIA) tablet 10 mg  10 mg Oral Daily Tyrone Nine, MD   10 mg at 03/21/19 0952  . irbesartan (AVAPRO) tablet 37.5 mg  37.5 mg Oral Daily Opyd, Lavone Neri, MD   37.5 mg at 03/21/19 0952  . levothyroxine (SYNTHROID) tablet 75 mcg  75 mcg Oral Q0600 Briscoe Deutscher, MD   75 mcg at 03/21/19 0525  . ondansetron (ZOFRAN) tablet 4 mg  4 mg Oral Q6H PRN Opyd, Lavone Neri, MD       Or  . ondansetron (ZOFRAN) injection 4 mg  4 mg Intravenous Q6H PRN Opyd, Lavone Neri, MD      . senna-docusate (Senokot-S) tablet 1 tablet  1 tablet Oral QHS PRN Opyd, Lavone Neri, MD   1 tablet at 03/17/19 1126  . sodium chloride flush (NS) 0.9 % injection 3 mL  3 mL Intravenous Q12H Opyd, Lavone Neri, MD   3 mL at 03/21/19 0952  . sodium chloride flush (NS) 0.9 % injection 3 mL  3 mL Intravenous PRN Opyd, Lavone Neri, MD        Musculoskeletal: Strength & Muscle Tone: decreased Gait & Station: did not witness Patient leans: did not witness  Psychiatric Specialty Exam: Physical Exam  Nursing note and vitals reviewed. Constitutional: She is oriented to person, place, and time.  Neurological: She is alert and oriented to person, place, and time.  Psychiatric: Her speech is normal and behavior is normal. Judgment normal. Her mood appears anxious. Cognition and memory are normal. She exhibits a depressed mood. She expresses suicidal ideation.    Review of Systems   Constitutional: Positive for malaise/fatigue.  Respiratory: Positive for shortness of breath.   Psychiatric/Behavioral: Positive for depression. The patient is nervous/anxious.   All other systems reviewed and are negative.   Blood pressure (!) 141/91, pulse 67, temperature 97.9 F (36.6 C), temperature source Oral, resp. rate 18, height  (1.626 m), weight 74 kg, SpO2 95 %.Body mass index is 28 kg/m.  General Appearance: telecommunication  Eye Contact:  telecommunication  Speech:  Normal Rate  Volume:  Normal  Mood:  Anxious and Depressed  Affect:  Congruent  Thought Process:  Coherent and Descriptions of Associations: Intact  Orientation:  Full (Time, Place, and Person)  Thought Content:  Rumination  Suicidal Thoughts:  Yes.  without intent/plan  Homicidal Thoughts:  No  Memory:  Immediate;   Good Recent;   Good Remote;   Good  Judgement:  Good  Insight:  Good  Psychomotor Activity:  Decreased  Concentration:  Concentration: Good and Attention Span: Good  Recall:  Good  Fund of Knowledge:  Good  Language:  Good  Akathisia:  No  Handed:  Right  AIMS (if indicated):     Assets:  Leisure Time Resilience Social Support  ADL's:  Impaired  Cognition:  WNL  Sleep:      73 year old female admitted for complications of dystonia and Covid, consult for depression and suicidal ideations.  Reports passive suicidal ideations with no plan or intent, no past suicide attempts.  Past history of depression and anxiety and currently taking Lexapro 10 mg which she has been on for 10 years, recently started on BuSpar 7.5 mg 2 times a day.  Depression fluctuates between 8-10 out of 10 with high levels of anxiety related to her decline in health and inability  to walk.  Her sleep is "good", minimal appetite.  No homicidal ideations, hallucinations, or substance abuse.  Treatment Plan Summary: Major depressive disorder, recurrent, moderate: -Recommend increasing Lexapro 10 mg daily to 20 mg  daily -Follow up with virtual therapy after discharge  Anxiety: -Continue Buspar 7.5 mg BID  Dystonia: -Continue Cogentin 1 mg BID  Disposition: No evidence of imminent risk to self or others at present.    This service was provided via telemedicine using a 2-way, Retail buyerinteractive audio technology.  Names of all persons participating in this telemedicine service and their role in this encounter. Name: Debra CassisSusan Morgan Role: Patient  Name: Celso AmyJamie Lord Role: PMHNP    Nanine MeansJamison Lord, NP 03/21/2019 10:09 AM    Attest to NP note

## 2019-03-21 NOTE — Progress Notes (Signed)
Physical Therapy Treatment Patient Details Name: Debra Morgan MRN: [id number] DOB: Jul 10, 1945 Today's Date: 03/21/2019    History of Present Illness 73 y.o. female admitted on 03/16/19 for generalized weakness, fatigue, SOB, chest pressure.  Pt was recently hospitalized at [facility] from 10/22-10/26 for COVID 19 PNA and d/c home.  CT was negative for PE, suspicious for pulmonary edema, and typical GGO found with COVID 19.  Pt with PMH of HTN, dystonia.. Has safety sitter for SI.    PT Comments    The patient is anxious about standing but did well inside the STEDY , patient's knees did not buckle. Did not attempt a trnasfer. Encouraged patient to perform Leg exercises. Will attempt standing  And transfers using RW/ drop arm WC. The patient states she is in agreement to go for rehab..     Follow Up Recommendations  SNF     Equipment Recommendations  Wheelchair (measurements PT)    Recommendations for Other Services       Precautions / Restrictions Precautions Precautions: Fall Precaution Comments: h/o falls due to dystonia and R LE locking up    Mobility  Bed Mobility               General bed mobility comments: In recliner  Transfers Overall transfer level: Needs assistance     Sit to Stand: Min assist;Mod assist         General transfer comment: Stood x 2 from low recliner at [facility] and x 2 inside steady, able to pick left foot uop x 5, R foot remained in position, did not have any extraneous movement as patient describes happens  Ambulation/Gait                 Stairs             Wheelchair Mobility    Modified Rankin (Stroke Patients Only)       Balance   Sitting-balance support: Feet unsupported;Bilateral upper extremity supported Sitting balance-Leahy Scale: Fair Sitting balance - Comments: reliant on close suppervision in STEDY     Standing balance-Leahy Scale: Poor                              Cognition  Arousal/Alertness: Awake/alert Behavior During Therapy: Anxious                                   General Comments: emotional, stating she cannot stand      Exercises General Exercises - Lower Extremity Ankle Circles/Pumps: AROM;Both;10 reps Long Arc Quad: AROM;Both;10 reps;Seated Hip Flexion/Marching: AROM;Both;10 reps;Seated    General Comments        Pertinent Vitals/Pain Faces Pain Scale: Hurts little more Pain Location: joint discomfort R >L new since COVID Pain Descriptors / Indicators: Aching;Discomfort;Crying Pain Intervention(s): Monitored during session    Home Living                      Prior Function            PT Goals (current goals can now be found in the care plan section) Progress towards PT goals: Progressing toward goals    Frequency    Min 2X/week      PT Plan Current plan remains appropriate;Frequency needs to be updated    Co-evaluation  AM-PAC PT "6 Clicks" Mobility   Outcome Measure  Help needed turning from your back to your side while in a flat bed without using bedrails?: A Little Help needed moving from lying on your back to sitting on the side of a flat bed without using bedrails?: A Little Help needed moving to and from a bed to a chair (including a wheelchair)?: Total Help needed standing up from a chair using your arms (e.g., wheelchair or bedside chair)?: Total Help needed to walk in hospital room?: Total Help needed climbing 3-5 steps with a railing? : Total 6 Click Score: 10    End of Session Equipment Utilized During Treatment: Gait belt Activity Tolerance: Patient tolerated treatment well Patient left: in chair;with call bell/phone within reach;with nursing/sitter in room Nurse Communication: Mobility status PT Visit Diagnosis: Muscle weakness (generalized) (M62.81);Difficulty in walking, not elsewhere classified (R26.2);History of falling (Z91.81)     Time: 8502-7741 PT  Time Calculation (min) (ACUTE ONLY): 35 min  Charges:  $Therapeutic Activity: 23-37 mins                     Blanchard Kelch PT Acute Rehabilitation Services  Office [phone]    Rada Hay 03/21/2019, 5:29 PM

## 2019-03-21 NOTE — Progress Notes (Signed)
Pt has updated family  

## 2019-03-21 NOTE — Progress Notes (Addendum)
Upon performing AM assessment patient stated that she "just wanted to die". She repeated many times that she wanted to" just go home and die" and that she had tried putting a pillow over her face in the night to kill herself. Dr. Sheran Luz and charge nurse Maudie Mercury were both informed of this and a sitter was placed with this patient immediately.

## 2019-03-21 NOTE — Progress Notes (Signed)
PROGRESS NOTE  Debra Morgan  AOZ:[id number] DOB: [birth year] DOA: 03/16/2019 PCP: Karleen Hampshire., MD  Outpatient Specialists: [facility], Dr. Tillman Sers Brief Narrative: Debra Morgan is a 73 y.o. female with a history of dystonia NOS, wheelchair-bound for 3.5 years, HTN, and depression who presented from home to the ED 10/28 with ongoing severe diffuse weakness. She was diagnosed with covid-19 10/16 and subsequently admitted to [facility] from 10/22 - 10/26 for treatment of covid-19 pneumonia, received decadron, but did not require supplemental oxygen. She experienced visual hallucinations during that hospitalization which were resolved at discharge. Since discharge she reports difficulty taking deep breaths, chest pressure, anxiety, and progressive generalized weakness making transfers difficult. On evaluation in the ED she was not febrile nor hypoxic but did have CTA chest ruling out PE but demonstrating peripheral GGOs and apical septal thickening concerning for pulmonary edema. CRP <0.8, d-dimer 0.651L NS given and admission requested at [facility].   Assessment & Plan: Principal Problem:   Generalized weakness Active Problems:   Thyroid disease   Dystonia   Pneumonia due to COVID-19 virus   Elevated transaminase level   Hypertension   Chest pain  Covid-19 pneumonia: GGOs on CT chest though no hypoxia. Now nearly 2 weeks from initial diagnosis and has received steroids already. Inflammatory markers not elevated. CRP and PCT undetectable.  - Hold off on target therapy for now.  - Airborne/contact precautions - Trend labs.  - If hallucinations return, previous hospitalists had good results with thorazine. Fortunately, has not recurred.  Acute pulmonary edema: Lexiscan stress testing Aug 2020 showed LVEF 76% with normal wall motion, no reversible ischemia. BNP 119.6. Weight 74.4kg up from outpatient records showing 71kg at neurology visit 10/8. No peripheral edema. Gave lasix x1 with bump in BUN, Cr and  has no crackles on exam. Do not feel echocardiography would be helpful in acute context.  - No further diuresis needed, monitor pulmonary exam.   Generalized weakness on chronic dystonia: At high risk for protracted recovery from covid. No new focal deficits on exam. Has not tolerated sinemet in the past and PD not felt to be accurate Dx, and botox injections have not helped. Dx by neurology is primary freezing of gait. No acute CT head findings. Tn and CK wnl.  - PT/OT consulted. Will consult CSW as the only safe disposition for the time being is to SNF for rehabilitation.   D-dimer elevation: Modest, and actually below age-adjusted cutoff. Has been receiving 40mg  q24h of lovenox. Now has some right calf compression tenderness which is reportedly new. With covid and immobility/hospitalizations, she remains at risk of VTE.  - Check LE U/S to r/o DVT. Still pending.   Chest pressure: Troponin negative, recent ischemic evaluation negative, no ischemic ECG changes. Doubt this is cardiogenic.  - Continue to treat supportively. Optimize oxygenation. - Treat anxiety.   Sinus bradycardia: Had coreg discontinued during last hospitalization. Reportedly had nonischemic stress testing 2 months ago. No AVB on ECG - Decreased coreg 6.25 > 3.125mg  BID with improvement.  Hypothyroidism: TSH wnl.  - Continue synthroid 32mcg daily  LFT elevation: Mild ALT > AST, would be consistent with covid infection.  - Normalized. No longer checking.   Depression, anxiety, and suicidal ideation: Depression/anxiety is acutely worse on chronically severe problem. Has undergone counseling with limited success.   - Continued lexapro at reported dose. - Started buspar 7.5mg  po BID. D/w patient that this is likely to take a while to help and may need dose increases pending tolerance,  but that anxiety is likely playing a significant role in her symptoms. Doubt this has caused worsening of mood, but defer to psychiatry what they  recommend. - Would avoid any other psychotropic medications due to poor tolerance.   Dizziness: Not rotational vertigo, no nystagmus on exam. This is chronic.  - Avoid orthostasis.  - prn medications   HTN:  - Continue ARB. Can restart home medications at discharge.   HLD:  - Continue zetia 10mg , not on statin  Peripheral neuropathy: Idiopathic. Vitamin B12, B6, E, and MMA all wnl at recent check. HbA1c 5.6%  DVT prophylaxis: Lovenox Code Status: Full Family Communication: None at bedside, speaking with pt's daughter daily, now pt wants me to call her husband.  Disposition Plan: SNF once bed available and insurance authorized. Medically stable. Unfortunately, with expressed suicidal ideation, will need psychiatry consultation today.   Consultants:   Psychiatry.   Procedures:   None  Antimicrobials:  None   Subjective: Continues to have diffuse weakness, decreased motivation to get out of bed but also very sick and tired of being in the bed. She desperately wants to go somewhere but feels it's hopeless to try to rehabilitate at this time. She did try to smother herself with a pillow last night and the night RN rightfully ordered sitter who is at bedside now.   Objective: Vitals:   03/20/19 1652 03/20/19 1912 03/21/19 0517 03/21/19 0802  BP: (!) 137/91 111/80 132/69 (!) 141/91  Pulse: 72 74 76 67  Resp: 17 18 18 18   Temp: (!) 97.4 F (36.3 C) 97.7 F (36.5 C) 97.9 F (36.6 C) 97.9 F (36.6 C)  TempSrc: Oral   Oral  SpO2: 96% 96% 96% 95%  Weight:      Height:        Intake/Output Summary (Last 24 hours) at 03/21/2019 0824 Last data filed at 03/20/2019 1835 Gross per 24 hour  Intake 360 ml  Output -  Net 360 ml   Filed Weights   03/17/19 0133 03/19/19 0500 03/20/19 0442  Weight: 74.4 kg 73.9 kg 74 kg   Gen: 73 y.o. female in no distress Pulm: Nonlabored breathing room air. Clear. CV: Regular rate and rhythm. No murmur, rub, or gallop. No JVD, no dependent  edema. GI: Abdomen soft, non-tender, non-distended, with normoactive bowel sounds.  Ext: Warm, no deformities Skin: No rashes, lesions or ulcers on visualized skin. Neuro: Alert, oriented. Actually very little decreased strength in EHLO on right compared to left when distracted from exam. Able to lift exrtemities off bed with 5/5 strength. Psych: Depressed mood and broad affect, no AVH, does confirm SI. Judgement and insight appear impaired.     Time spent: 25 minutes.  13/01/20, MD [facility] [email or web address] 03/21/2019, 8:24 AM

## 2019-03-22 LAB — CBC
HCT: 40.6 % (ref 36.0–46.0)
Hemoglobin: 14.1 g/dL (ref 12.0–15.0)
MCH: 34.5 pg — ABNORMAL HIGH (ref 26.0–34.0)
MCHC: 34.7 g/dL (ref 30.0–36.0)
MCV: 99.3 fL (ref 80.0–100.0)
Platelets: 366 10*3/uL (ref 150–400)
RBC: 4.09 MIL/uL (ref 3.87–5.11)
RDW: 12.8 % (ref 11.5–15.5)
WBC: 10.1 10*3/uL (ref 4.0–10.5)
nRBC: 0 % (ref 0.0–0.2)

## 2019-03-22 LAB — URINALYSIS, ROUTINE W REFLEX MICROSCOPIC
Bacteria, UA: NONE SEEN
Bilirubin Urine: NEGATIVE
Glucose, UA: NEGATIVE mg/dL
Hgb urine dipstick: NEGATIVE
Ketones, ur: NEGATIVE mg/dL
Nitrite: NEGATIVE
Protein, ur: NEGATIVE mg/dL
Specific Gravity, Urine: 1.024 (ref 1.005–1.030)
pH: 5 (ref 5.0–8.0)

## 2019-03-22 LAB — COMPREHENSIVE METABOLIC PANEL
ALT: 36 U/L (ref 0–44)
AST: 30 U/L (ref 15–41)
Albumin: 3.3 g/dL — ABNORMAL LOW (ref 3.5–5.0)
Alkaline Phosphatase: 68 U/L (ref 38–126)
Anion gap: 8 (ref 5–15)
BUN: 31 mg/dL — ABNORMAL HIGH (ref 8–23)
CO2: 27 mmol/L (ref 22–32)
Calcium: 8.9 mg/dL (ref 8.9–10.3)
Chloride: 102 mmol/L (ref 98–111)
Creatinine, Ser: 0.86 mg/dL (ref 0.44–1.00)
GFR calc Af Amer: >60 mL/min (ref >60)
GFR calc non Af Amer: >60 mL/min (ref >60)
Glucose, Bld: 134 mg/dL — ABNORMAL HIGH (ref 70–99)
Potassium: 4.3 mmol/L (ref 3.5–5.1)
Sodium: 137 mmol/L (ref 135–145)
Total Bilirubin: 1 mg/dL (ref 0.3–1.2)
Total Protein: 6.6 g/dL (ref 6.5–8.1)

## 2019-03-22 MED ORDER — CEPHALEXIN 500 MG PO CAPS
500.0000 mg | ORAL_CAPSULE | Freq: Two times a day (BID) | ORAL | Status: DC
  Administered 2019-03-22 – 2019-03-26 (×8): 500 mg via ORAL
  Filled 2019-03-22 (×10): qty 1

## 2019-03-22 NOTE — Progress Notes (Signed)
This nurse covering for sitter who is on break. This nurse to stay at bedside, 1:1 with patient, patient laying on her right side, eyes closed, notable rise and fall of her chest visualized. Will remain at bedside until sitter returns.

## 2019-03-22 NOTE — Progress Notes (Signed)
Daughter updated 

## 2019-03-22 NOTE — TOC Progression Note (Signed)
Transition of Care [facility]) - Progression Note    Patient Details  Name: Debra Morgan MRN: [id number] Date of Birth: 1946-01-26  Transition of Care [facility]) CM/SW Contact  Joaquin Courts, RN Phone Number: 03/22/2019, 11:19 AM  Clinical Narrative:    CM spoke with [place] regarding auth. Auth approved from 11/2-11/4 at [facility]. Alen Bleacher [id number], updates can be faxed to [phone] if needed.  Of note, [facility] did not extend bed offer, [facility] is only bed offer at this time. Additionally, patient has been referred for psych consult which is pending. Will need to update auth once patient is deemed medically stable for dc.           Expected Discharge Plan and Services           Expected Discharge Date: 03/18/19                                     Social Determinants of Health (SDOH) Interventions    Readmission Risk Interventions No flowsheet data found.

## 2019-03-22 NOTE — Progress Notes (Signed)
Messaged Waylan Boga, NP on secure chat and amion to see if psych team could meet with pt today. Added Bonner Puna, MD on secure chat message. No response from psych team at this time. Will continue to monitor pt

## 2019-03-22 NOTE — Progress Notes (Addendum)
PROGRESS NOTE  Debra Morgan  WFU:[id number] DOB: 09/23/1945 DOA: 03/16/2019 PCP: Karleen Hampshire., MD  Outpatient Specialists: [facility], Dr. Tillman Sers Brief Narrative: Debra Morgan is a 73 y.o. female with a history of dystonia NOS, wheelchair-bound for 3.5 years, HTN, and depression who presented from home to the ED 10/28 with ongoing severe diffuse weakness. She was diagnosed with covid-19 10/16 and subsequently admitted to [facility] from 10/22 - 10/26 for treatment of covid-19 pneumonia, received decadron, but did not require supplemental oxygen. She experienced visual hallucinations during that hospitalization which were resolved at discharge. Since discharge she reports difficulty taking deep breaths, chest pressure, anxiety, and progressive generalized weakness making transfers difficult. On evaluation in the ED she was not febrile nor hypoxic but did have CTA chest ruling out PE but demonstrating peripheral GGOs and apical septal thickening concerning for pulmonary edema. CRP <0.8, d-dimer 0.651L NS given and admission requested at [facility].   Assessment & Plan: Principal Problem:   Generalized weakness Active Problems:   Thyroid disease   Dystonia   Pneumonia due to COVID-19 virus   Elevated transaminase level   Hypertension   Chest pain   Major depressive disorder, recurrent episode, moderate (HCC)  Covid-19 pneumonia: GGOs on CT chest though no hypoxia. Now nearly 2 weeks from initial diagnosis and has received steroids already. Inflammatory markers not elevated. CRP and PCT undetectable.  - Hold off on targeted therapy for now.  - Airborne/contact precautions - Trend labs.  - If hallucinations return, previous hospitalists had good results with thorazine. Fortunately, has not recurred.  Acute pulmonary edema: Lexiscan stress testing Aug 2020 showed LVEF 76% with normal wall motion, no reversible ischemia. BNP 119.6. Weight 74.4kg up from outpatient records showing 71kg at neurology visit  10/8. No peripheral edema. Gave lasix x1 with bump in BUN, Cr and has no crackles on exam. Do not feel echocardiography would be helpful in acute context.  - No further diuresis needed, monitor pulmonary exam.   Generalized weakness on chronic dystonia: At high risk for protracted recovery from covid. No new focal deficits on exam. Has not tolerated sinemet in the past and PD not felt to be accurate Dx, and botox injections have not helped. Dx by neurology is primary freezing of gait. No acute CT head findings. Tn and CK wnl.  - PT/OT consulted. Will consult CSW as the only safe disposition for the time being is to SNF for rehabilitation.   D-dimer elevation: Modest, and actually below age-adjusted cutoff. Has been receiving 40mg  q24h of lovenox. Neg LE U/S.    Chest pressure: Troponin negative, recent ischemic evaluation negative, no ischemic ECG changes. Doubt this is cardiogenic.  - Continue to treat supportively. Optimize oxygenation. - Treat anxiety.   Sinus bradycardia: Had coreg discontinued during last hospitalization. Reportedly had nonischemic stress testing 2 months ago. No AVB on ECG - Decreased coreg 6.25 > 3.125mg  BID with improvement.  Hypothyroidism: TSH wnl.  - Continue synthroid 45mcg daily  LFT elevation: Mild ALT > AST, would be consistent with covid infection.  - Normalized. No longer checking.   Depression, anxiety, and suicidal ideation: Depression/anxiety is acutely worse on chronically severe problem. Has undergone counseling with limited success.   - Continued lexapro at reported dose. - Started buspar 7.5mg  po BID. D/w patient that this is likely to take a while to help and may need dose increases pending tolerance, but that anxiety is likely playing a significant role in her symptoms. Doubt this has caused worsening of  mood, but defer to psychiatry.  - Psychiatry consulted 11/2, unable to evaluate the patient that day, will evaluate today.   Dizziness: Not  rotational vertigo, no nystagmus on exam. This is chronic.  - Avoid orthostasis.  - prn medications   HTN:  - Continue ARB. Can restart home medications at discharge.   HLD:  - Continue zetia 10mg , not on statin  Peripheral neuropathy: Idiopathic. Vitamin B12, B6, E, and MMA all wnl at recent check. HbA1c 5.6%  Acute urinary retention: Possible psychogenic component. Creatinine improving, no leukocytosis.  - Post void residual. If retaining >300cc, place foley - Avoid anticholinergics - UA checked, culture pending. Will give keflex pending culture data.  DVT prophylaxis: Lovenox Code Status: Full Family Communication: None at bedside. Spoke with patient's husband.  Disposition Plan: SNF once bed available and insurance authorized. Medically stable. Psychiatry feels the patient does not warrant further inpatient management.  Consultants:   Psychiatry.   Procedures:   None  Antimicrobials:  None   Subjective: Remains depressed, amenable to SNF but repeatedly states she will not get out the bed bed today. Has pain all over.  Objective: Vitals:   03/21/19 1930 03/22/19 0311 03/22/19 0549 03/22/19 0729  BP:   127/74 137/81  Pulse:   66 65  Resp:   18 19  Temp: 97.6 F (36.4 C)  97.9 F (36.6 C)   TempSrc: Oral     SpO2: 93%  95% 95%  Weight:  74.9 kg    Height:        Intake/Output Summary (Last 24 hours) at 03/22/2019 0811 Last data filed at 03/21/2019 1823 Gross per 24 hour  Intake 480 ml  Output 925 ml  Net -445 ml   Filed Weights   03/19/19 0500 03/20/19 0442 03/22/19 0311  Weight: 73.9 kg 74 kg 74.9 kg   Gen: 73 y.o. female in no distress Pulm: Nonlabored breathing room air. Clear. CV: Regular rate and rhythm. No murmur, rub, or gallop. No JVD, no dependent edema. GI: Abdomen soft, now tender diffusely, no rebound or guarding, nondistended, normal bowel sounds.  Ext: Warm, no deformities Skin: No new rashes, lesions or ulcers on visualized skin.  Neuro: Alert and oriented.  Psych: Depressed with broad affect, No SI.66     Time spent: 25 minutes.  Marland Kitchen, MD [facility] [email or web address] 03/22/2019, 8:11 AM

## 2019-03-22 NOTE — Progress Notes (Signed)
Pt still having difficulty voiding. Gruns, MD notified. Pt bladder scanned read >332ml and pt I&O cathed per order from Bonner Puna, MD. Measurements in doc flow. Will continue to monitor pt

## 2019-03-23 LAB — CREATININE, SERUM
Creatinine, Ser: 0.93 mg/dL (ref 0.44–1.00)
GFR calc Af Amer: >60 mL/min (ref >60)
GFR calc non Af Amer: >60 mL/min (ref >60)

## 2019-03-23 LAB — URINE CULTURE

## 2019-03-23 NOTE — Progress Notes (Signed)
OT Treatment Note  Able to progress ambulation today in room after encouragement. Pt ambulated around room with +2 A for safety and management of RW. Pt with significant fear of falling and need to feel therapist's hands on belt and under arm to feel secure to walk. Apparent anxiety. Requires Max assurance regarding performance with activity.     03/23/19 1700  OT Visit Information  Last OT Received On 03/23/19  Assistance Needed +2  PT/OT/SLP Co-Evaluation/Treatment Yes  Reason for Co-Treatment Necessary to address cognition/behavior during functional activity;For patient/therapist safety  OT goals addressed during session ADL's and self-care  History of Present Illness 73 y.o. female admitted on 03/16/19 for generalized weakness, fatigue, SOB, chest pressure.  Pt was recently hospitalized at [facility] from 10/22-10/26 for COVID 19 PNA and d/c home.  CT was negative for PE, suspicious for pulmonary edema, and typical GGO found with COVID 19.  Pt with PMH of HTN, dystonia.  Precautions  Precautions Fall;Other (comment) (extreme apprehension with any movement)  Precaution Comments h/o falls due to dystonia and R LE locking up  Pain Assessment  Pain Assessment Faces  Faces Pain Scale 2  Pain Location did not seem to be in pain more apprehension about falling and injury  Pain Descriptors / Indicators Discomfort  Pain Intervention(s) Limited activity within patient's tolerance  Cognition  Arousal/Alertness Awake/alert  Behavior During Therapy Anxious  Overall Cognitive Status No family/caregiver present to determine baseline cognitive functioning  General Comments needs encouragement to complete tasks given "I cna't I can't"  Upper Extremity Assessment  Upper Extremity Assessment Generalized weakness  Lower Extremity Assessment  Lower Extremity Assessment Defer to PT evaluation  ADL  Overall ADL's  Needs assistance/impaired  Grooming Set up;Sitting  Lower Body Dressing Moderate  assistance;Sit to/from stand  Lower Body Dressing Details (indicate cue type and reason) Crossing feet in figure 4 to donn socks; requires encouragement to complete independently after set up  Functional mobility during ADLs Minimal assistance;+2 for safety/equipment;Rolling walker;Cueing for safety;Cueing for sequencing  General ADL Comments Pt fearful of falling; "I Can't". Stedy used to mobilize totoilet then to [employer] Mobility  General bed mobility comments pt recieved sitting in recliner agreeable to tx  Balance  Sitting-balance support Feet unsupported;Bilateral upper extremity supported  Sitting balance-Leahy Scale Fair  Standing balance-Leahy Scale Poor  Standing balance comment 2+ assist to stand, c.o feeling like she is going backwards  Restrictions  Weight Bearing Restrictions No  Transfers  Overall transfer level Needs assistance  Equipment used Rolling walker (2 wheeled)  Transfers Sit to/from Stand  Sit to [employer] physical assistance  General transfer comment needs 2 person assist for safety and also sec to unpredictability of dystonia  General Comments  General comments (skin integrity, edema, etc.) given word search puzzles to help with anxiety  Exercises  Exercises Other exercises  Other Exercises  Other Exercises incentive spirometer x 10FLUTTER VALVE Z 10  OT - End of Session  Equipment Utilized During Treatment Gait belt;Rolling walker  Activity Tolerance Patient tolerated treatment well  Patient left in chair;with call bell/phone within reach;with chair alarm set  Nurse Communication Mobility status  OT Assessment/Plan  OT Plan Discharge plan remains appropriate  OT Visit Diagnosis Unsteadiness on feet (R26.81);Other abnormalities of gait and mobility (R26.89);Muscle weakness (generalized) (M62.81);Dizziness and giddiness (R42)  OT Frequency (ACUTE ONLY) Min 2X/week  Follow Up Recommendations SNF;Supervision/Assistance - 24 hour  OT Equipment  None recommended by OT  AM-PAC OT "6 Clicks" Daily  Activity Outcome Measure (Version 2)  Help from another person eating meals? 4  Help from another person taking care of personal grooming? 3  Help from another person toileting, which includes using toliet, bedpan, or urinal? 2  Help from another person bathing (including washing, rinsing, drying)? 2  Help from another person to put on and taking off regular upper body clothing? 3  Help from another person to put on and taking off regular lower body clothing? 3  6 Click Score 17  OT Goal Progression  Progress towards OT goals Progressing toward goals  Acute Rehab OT Goals  Patient Stated Goal to feel better, breathe better  OT Goal Formulation With patient  Time For Goal Achievement 04/01/19  Potential to Achieve Goals Good  ADL Goals  Pt Will Perform Lower Body Bathing with min guard assist;sit to/from stand;sitting/lateral leans  Pt Will Perform Lower Body Dressing with min guard assist;sitting/lateral leans;sit to/from stand  Pt Will Transfer to Toilet bedside commode;stand pivot transfer;with min guard assist  Pt Will Perform Toileting - Clothing Manipulation and hygiene with supervision;sitting/lateral leans  Additional ADL Goal #1 Pt will independently verbalize 3 strategies to reduce risk of falls  OT Time Calculation  OT Start Time (ACUTE ONLY) 1433  OT Stop Time (ACUTE ONLY) 1456  OT Time Calculation (min) 23 min  OT General Charges  $OT Visit 1 Visit  OT Treatments  $Self Care/Home Management  8-22 mins  Luisa Dago, OT/L   Acute OT Clinical Specialist Acute Rehabilitation Services Pager [phone] Office [phone]

## 2019-03-23 NOTE — Progress Notes (Signed)
Daughter updated 

## 2019-03-23 NOTE — Progress Notes (Signed)
PROGRESS NOTE    Debra Morgan  ATF:[id number] DOB: 1945-09-28 DOA: 03/16/2019 PCP: Laqueta Due., MD    Brief Narrative:  73 year old female who presented with generalized weakness, fatigue, cough, dyspnea and chest pressure.  She does have significant past medical history for hypothyroidism, hypertension and movement disorder.  Patient tested positive for COVID-19 03/04/2019, she was noted to have persistent dry cough, which was complicated by hallucinations and dyspnea.  She was hospitalized 10/22, she was treated with dexamethasone and discharged 03/14/2019.  At home she continued to have rapid decline in her functional status, worsening mobility despite she chronically wheelchair-bound.  On her initial physical examination blood pressure 130/76, heart rate 49, respirate 14, temperature 98.1, oxygen saturation 95%, her lungs had no wheezing, rales or rhonchi, heart S1-S2 present, rhythmic, abdomen soft, lower extremities with trace edema.  Generalized weakness.  Her chest x-ray had no significant infiltrates.  Chest CT with bilateral interstitial infiltrates.  Patient was admitted with SARS COVID-19 viral pneumonia.  Patient continue to have deconditioning and severe generalized weakness.    Assessment & Plan:   Principal Problem:   Generalized weakness Active Problems:   Thyroid disease   Dystonia   Pneumonia due to COVID-19 virus   Elevated transaminase level   Hypertension   Chest pain   Major depressive disorder, recurrent episode, moderate (HCC)   1. COVID 19 pneumonia. Patient has been initially diagnosed about 2 weeks ago, no current signs or symptoms of viral pneumonia. But patient with a rapid decline in physical functional capacity. Continue physical therapy and plan for SNF.  Continue to hold on remdesivir and steroids for now. Will check b12 levels in am.   2. Hypothyroid. Continue with levothyrpxine.  3. Depression. No hallucinations, but patient is very  deconditioned, will continue buspar and lexapro.   4. HTN. Continue blood pressure monitoring.    5. Dyslipidemia. Continue with statin therapy.   6. UTI wit urinary retention. Not present on admission, continue antibiotic therapy with cephalezxin for now. Continue in and out catheterization as needed.     DVT prophylaxis: enoxaparin    Code Status:  full Family Communication: I spoke with patient's husband over the phone and all questions were addressed.  Disposition Plan/ discharge barriers: pending transfer to SNF.   Body mass index is 28.08 kg/m. Malnutrition Type:      Malnutrition Characteristics:      Nutrition Interventions:     RN Pressure Injury Documentation:     Consultants:     Procedures:     Antimicrobials:       Subjective: Patient is not feeling well today, with generalized weakness, can not get out of the bed, poor appetite, no nausea or vomiting, positive back pain.   Objective: Vitals:   03/22/19 1912 03/23/19 0500 03/23/19 0528 03/23/19 0719  BP: 119/85  111/65 119/76  Pulse: 78  77 78  Resp: 20  20 18   Temp: (!) 97.5 F (36.4 C)  97.6 F (36.4 C) 98.3 F (36.8 C)  TempSrc:    Oral  SpO2: 95%  96% 95%  Weight:  74.2 kg    Height:        Intake/Output Summary (Last 24 hours) at 03/23/2019 0908 Last data filed at 03/22/2019 1800 Gross per 24 hour  Intake 480 ml  Output 375 ml  Net 105 ml   Filed Weights   03/20/19 0442 03/22/19 0311 03/23/19 0500  Weight: 74 kg 74.9 kg 74.2 kg    Examination:  General: Not in pain or dyspnea, deconditioned  Neurology: Awake and alert, non focal  E ENT: mild pallor, no icterus, oral mucosa moist Cardiovascular: No JVD. S1-S2 present, rhythmic, no gallops, rubs, or murmurs. No lower extremity edema. Pulmonary: positive breath sounds bilaterally. Gastrointestinal. Abdomen with no organomegaly, non tender, no rebound or guarding Skin. No rashes Musculoskeletal: no joint deformities      Data Reviewed: I have personally reviewed following labs and imaging studies  CBC: Recent Labs  Lab 03/16/19 1146 03/17/19 0450 03/22/19 0845  WBC 7.9 6.5 10.1  NEUTROABS 5.7 4.4  --   HGB 15.0 13.4 14.1  HCT 43.1 39.4 40.6  MCV 98.6 96.8 99.3  PLT 351 341 427   Basic Metabolic Panel: Recent Labs  Lab 03/17/19 0450 03/18/19 0145 03/19/19 0010 03/20/19 0005 03/22/19 0845 03/23/19 0135  NA 136 137 136 138 137  --   K 3.9 3.9 3.9 3.8 4.3  --   CL 104 102 99 101 102  --   CO2 24 25 26 25 27   --   GLUCOSE 133* 93 106* 85 134*  --   BUN 32* 40* 46* 38* 31*  --   CREATININE 0.95 1.03* 1.02* 0.93 0.86 0.93  CALCIUM 9.1 9.3 9.7 9.4 8.9  --   MG 2.1  --   --   --   --   --   PHOS 2.9  --   --   --   --   --    GFR: Estimated Creatinine Clearance: 52.4 mL/min (by C-G formula based on SCr of 0.93 mg/dL). Liver Function Tests: Recent Labs  Lab 03/17/19 0450 03/18/19 0145 03/19/19 0010 03/20/19 0005 03/22/19 0845  AST 47* 37 29 22 30   ALT 63* 65* 52* 40 36  ALKPHOS 65 71 74 65 68  BILITOT 0.7 1.0 0.5 0.5 1.0  PROT 6.4* 6.5 6.5 6.3* 6.6  ALBUMIN 3.1* 3.2* 3.1* 3.1* 3.3*   No results for input(s): LIPASE, AMYLASE in the last 168 hours. No results for input(s): AMMONIA in the last 168 hours. Coagulation Profile: No results for input(s): INR, PROTIME in the last 168 hours. Cardiac Enzymes: Recent Labs  Lab 03/16/19 2245  CKTOTAL 76   BNP (last 3 results) No results for input(s): PROBNP in the last 8760 hours. HbA1C: No results for input(s): HGBA1C in the last 72 hours. CBG: No results for input(s): GLUCAP in the last 168 hours. Lipid Profile: No results for input(s): CHOL, HDL, LDLCALC, TRIG, CHOLHDL, LDLDIRECT in the last 72 hours. Thyroid Function Tests: No results for input(s): TSH, T4TOTAL, FREET4, T3FREE, THYROIDAB in the last 72 hours. Anemia Panel: No results for input(s): VITAMINB12, FOLATE, FERRITIN, TIBC, IRON, RETICCTPCT in the last 72 hours.     Radiology Studies: I have reviewed all of the imaging during this hospital visit personally     Scheduled Meds: . benztropine  1 mg Oral BID  . busPIRone  7.5 mg Oral BID  . carvedilol  3.125 mg Oral BID WC  . cephALEXin  500 mg Oral Q12H  . enoxaparin (LOVENOX) injection  40 mg Subcutaneous Q24H  . escitalopram  10 mg Oral Daily  . ezetimibe  10 mg Oral Daily  . irbesartan  37.5 mg Oral Daily  . levothyroxine  75 mcg Oral Q0600  . sodium chloride flush  3 mL Intravenous Q12H   Continuous Infusions: . sodium chloride       LOS: 6 days  Mauricio Gerome Apley, MD

## 2019-03-23 NOTE — TOC Progression Note (Signed)
Transition of Care [facility]) - Progression Note    Patient Details  Name: Debra Morgan MRN: [id number] Date of Birth: [birth year]  Transition of Care [facility]) CM/SW Contact  Ninfa Meeker, RN Phone Number: [phone] (working remotely) 03/23/2019, 5:45 PM  Clinical Narrative:   Case manager received call from [facility] at 5:32pm stating patient has [place] approval for [employer] until 03/25/19. Cristobal Goldmann, reviewer fax updates if needed.  [phone]. Referencel [id number]. Tourist information centre manager notified MD of authorization, plan is for patient to discharge in the AM to [facility].          Expected Discharge Plan and Services: to [facility]           Expected Discharge Date: 03/18/19                                     Social Determinants of Health (SDOH) Interventions    Readmission Risk Interventions No flowsheet data found.

## 2019-03-23 NOTE — Progress Notes (Signed)
Husband updated.

## 2019-03-23 NOTE — TOC Progression Note (Signed)
Transition of Care [facility]) - Progression Note    Patient Details  Name: Saniya Tranchina MRN: [id number] Date of Birth: 06/03/1945  Transition of Care [facility]) CM/SW Contact  Loletha Grayer Beverely Pace, RN Phone Number: 03/23/2019, 2:10 PM  Clinical Narrative:  Case manager contacted patient's husband Timmothy Sours, as requested. He states he wants his wife sent to SNF asap.CM explained that we do not have a discharge order at this time and when patient is medically ready we definitely will get her to [facility].       Expected Discharge Plan and Services           Expected Discharge Date: 03/18/19                                     Social Determinants of Health (SDOH) Interventions    Readmission Risk Interventions No flowsheet data found.

## 2019-03-23 NOTE — Progress Notes (Signed)
Physical Therapy Treatment Patient Details Name: Debra Morgan MRN: [id number] DOB: 1946-05-15 Today's Date: 03/23/2019    History of Present Illness 73 y.o. female admitted on 03/16/19 for generalized weakness, fatigue, SOB, chest pressure.  Pt was recently hospitalized at [facility] from 10/22-10/26 for COVID 19 PNA and d/c home.  CT was negative for PE, suspicious for pulmonary edema, and typical GGO found with COVID 19.  Pt with PMH of HTN, dystonia.    PT Comments    Pt extremely apprehensive about any standing and ambulation activities. Initiated tx with sit<>stand and pt needs max encouragement to complete this, needs seated rest prior to continuing with activity. With 2 + staff assist was able to stand and even take some steps approx 43ft in room. Steps are very small and very shuffled but no losses of balance noted and RLE did not go out from under pt. Pt on room air throughout, sats fluctuate in 80s and 90s.    Follow Up Recommendations  SNF     Equipment Recommendations  Wheelchair (measurements PT)    Recommendations for Other Services       Precautions / Restrictions Precautions Precautions: Fall;Other (comment)(extreme apprehension with any movement) Precaution Comments: h/o falls due to dystonia and R LE locking up Restrictions Weight Bearing Restrictions: No    Mobility  Bed Mobility               General bed mobility comments: pt recieved sitting in recliner agreeable to tx  Transfers Overall transfer level: Needs assistance Equipment used: Rolling walker (2 wheeled) Transfers: Sit to/from Stand Sit to Stand: Min assist;+2 physical assistance         General transfer comment: needs 2 person assist for safety and also sec to unpredictability of dystonia  Ambulation/Gait Ambulation/Gait assistance: +2 physical assistance Gait Distance (Feet): 30 Feet Assistive device: Rolling walker (2 wheeled) Gait Pattern/deviations: Step-through pattern;Shuffle      General Gait Details: w/ 2 person assist was able to take some steps in room, although very very apprehensive about falling   Stairs             Wheelchair Mobility    Modified Rankin (Stroke Patients Only)       Balance   Sitting-balance support: Feet unsupported;Bilateral upper extremity supported Sitting balance-Leahy Scale: Fair       Standing balance-Leahy Scale: Poor Standing balance comment: 2+ assist to stand, c.o feeling like she is going backwards                            Cognition Arousal/Alertness: Awake/alert Behavior During Therapy: Anxious Overall Cognitive Status: History of cognitive impairments - at baseline                                 General Comments: needs extreme encouragement to complete tasks given      Exercises      General Comments        Pertinent Vitals/Pain Pain Location: did not seem to be in pain more apprehension about falling and injury    Home Living                      Prior Function            PT Goals (current goals can now be found in the care plan section) Acute Rehab PT Goals Time For  Goal Achievement: 03/31/19 Potential to Achieve Goals: Fair Progress towards PT goals: Progressing toward goals    Frequency    Min 2X/week      PT Plan Current plan remains appropriate;Frequency needs to be updated    Co-evaluation PT/OT/SLP Co-Evaluation/Treatment: Yes Reason for Co-Treatment: Complexity of the patient's impairments (multi-system involvement);Necessary to address cognition/behavior during functional activity;For patient/therapist safety PT goals addressed during session: Mobility/safety with mobility;Balance;Strengthening/ROM        AM-PAC PT "6 Clicks" Mobility   Outcome Measure  Help needed turning from your back to your side while in a flat bed without using bedrails?: A Little Help needed moving from lying on your back to sitting on the side of a  flat bed without using bedrails?: A Little Help needed moving to and from a bed to a chair (including a wheelchair)?: A Lot Help needed standing up from a chair using your arms (e.g., wheelchair or bedside chair)?: A Lot Help needed to walk in hospital room?: A Lot Help needed climbing 3-5 steps with a railing? : Total 6 Click Score: 13    End of Session Equipment Utilized During Treatment: Gait belt Activity Tolerance: Patient tolerated treatment well Patient left: in chair;with call bell/phone within reach;with nursing/sitter in room   PT Visit Diagnosis: Muscle weakness (generalized) (M62.81);Difficulty in walking, not elsewhere classified (R26.2);History of falling (Z91.81)     Time: 1937-9024 PT Time Calculation (min) (ACUTE ONLY): 25 min  Charges:  $Gait Training: 8-22 mins                     Horald Chestnut, PT     Delford Field 03/23/2019, 4:04 PM

## 2019-03-23 NOTE — Care Management (Signed)
Medical necessity form that is in this chart is in error.     Ricki Miller, RN BSN Case Manager

## 2019-03-24 LAB — VITAMIN B12: Vitamin B-12: 868 pg/mL (ref 180–914)

## 2019-03-24 MED ORDER — EZETIMIBE 10 MG PO TABS: 10.0000 mg | ORAL_TABLET | Freq: Every day | ORAL | 0 refills | Status: AC

## 2019-03-24 MED ORDER — BUSPIRONE HCL 7.5 MG PO TABS: 7.5000 mg | ORAL_TABLET | Freq: Two times a day (BID) | ORAL | 0 refills | Status: DC

## 2019-03-24 MED ORDER — ESCITALOPRAM OXALATE 10 MG PO TABS: 10.0000 mg | ORAL_TABLET | Freq: Every day | ORAL | 0 refills | Status: DC

## 2019-03-24 NOTE — Care Management Important Message (Signed)
Important Message  Patient Details  Name: Debra Morgan MRN: [id number] Date of Birth: [birth year]   Medicare Important Message Given:  Yes - Important Message mailed due to current National Emergency  Verbal consent obtained due to current National Emergency  Relationship to patient: Spouse/Significant Other Contact Name: Venie Montesinos Call Date: 03/24/19  Time: 1428 Phone: [phone] Outcome: Spoke with contact Important Message mailed to: Other (must enter comment)(emailed to [email or web address]    Jakeem Grape P Rebekah Sprinkle 03/24/2019, 2:29 PM

## 2019-03-24 NOTE — TOC Progression Note (Signed)
Transition of Care [facility]) - Progression Note    Patient Details  Name: Debra Morgan MRN: [id number] Date of Birth: 18-Oct-1945  [facility] CM/SW Contact  Loletha Grayer Beverely Pace, RN Phone Number: 03/24/2019, 11:03 AM  Clinical Narrative:  Case manager received call from patient's daughter and husband stating they wanted her to go to [facility] when contacted by CM did not have a bed to offer because patient is out of quarantine window. Family then said she can go to [facility], but we will have to wait until tomorrow, the bed that she had was given away. CM notified MD of same.             Expected Discharge Plan and Services: SNF           Expected Discharge Date: 03/18/19                                     Social Determinants of Health (SDOH) Interventions    Readmission Risk Interventions No flowsheet data found.

## 2019-03-24 NOTE — Discharge Summary (Signed)
Physician Discharge Summary  Debra Morgan DQQ:[id number] DOB: 1945-12-18 DOA: 03/16/2019  PCP: Karleen Hampshire., MD  Admit date: 03/16/2019 Discharge date: 03/24/2019  Admitted From: Home  Disposition:  SNF   Recommendations for Outpatient Follow-up and new medication changes:  1. Follow up with Dr. Rozetta Nunnery in 2 weeks.  2. Continue quarantine for 2 weeks, maintain physical distancing and use a mask in public.  3. Started on buspirone and escitalpram for depression/ anxiety. 4. Continue with ezetimibe for dyslipidemia.   Home Health: na   Equipment/Devices: no   Discharge Condition: Stable  CODE STATUS: full Diet recommendation: heart healthy.  Brief/Interim Summary: 73 year old female who presented with generalized weakness, fatigue, cough, dyspnea and chest pressure.  She does have significant past medical history for hypothyroidism, hypertension and movement disorder.  Patient tested positive for COVID-19 03/04/2019, she was noted to have persistent dry cough, which was complicated by hallucinations and dyspnea.  She was hospitalized 10/22, she was treated with dexamethasone and discharged 03/14/2019.  At home she continued to have rapid decline in her functional status, worsening mobility despite she chronically wheelchair-bound.  On her initial physical examination blood pressure 130/76, heart rate 49, respiratory rate 14, temperature 98.1, oxygen saturation 95%, her lungs had no wheezing, rales or rhonchi, heart S1-S2 present, rhythmic, abdomen soft, lower extremities with trace edema.  Generalized weakness. Sodium 137, potassium 4.3, chloride 103, bicarb 22, glucose 123, BUN 34, creatinine 0.98, white count 7.9, hemoglobin 15.0, hematocrit 43.1, platelets 351. Her chest x-ray had no significant infiltrates.  Chest CT with bilateral interstitial infiltrates.  EKG with 53 bpm, normal axis, normal intervals, sinus rhythm, no ST segment or T wave changes.  Patient was admitted with SARS COVID-19  viral pneumonia.  Patient continue to have deconditioning and severe generalized weakness.   Patient was seen by physical therapy, now plan to discharge to skilled nursing facility for rehabilitation.  1.  SARS COVID-19 viral pneumonia.  Despite having infiltrates on chest imaging she had no significant respiratory symptoms, her initial diagnosis of COVID-19 was about 2 weeks prior to hospitalization, she was treated with systemic corticosteroids.  Her inflammatory markers were not significantly elevated.  She was kept on respiratory isolation.  Did not qualify for specific treatment with antivirals or continue systemic corticosteroids.   Transitory elevation of liver enzymes. Pulmonary edema suggested by imaging, but clinically this condition was ruled out. Patient did not receive diuretics.   Discharge oxymetry is 95 to 99% on room air.   2.  Generalized deconditioning in the setting of chronic dystonia, peripheral neuropathy and complicated by metabolic encephalopathy.  Patient had rapid decline in her physical functional status, likely as a consequence of recent SARS COVID-19 viral infection.  Patient received supportive medical therapy, along with physical therapy and psychiatric consultation.  Patient had suicidal ideation, she was evaluated by psychiatry, recommended escitalopram and and BuSpar.  B12 levels within normal ranges.   3.  Hypertension and dyslipidemia.  Continue blood pressure control with candesartan, triamterene and hctz. Ezetimibe for dyslipidemia.    4. Urinary tract infection and urinary retention/ present on admission.  Patient had urinary retention that required Foley catheter placement, patient received antibiotic therapy with cephalosporins with good toleration.  At discharge patient passed a voiding trial.  5. Hypothyroid. Continue with levothyroxine.   Discharge Diagnoses:  Principal Problem:   Generalized weakness Active Problems:   Thyroid disease    Dystonia   Pneumonia due to COVID-19 virus   Elevated transaminase level  Hypertension   Chest pain   Major depressive disorder, recurrent episode, moderate (HCC)    Discharge Instructions   Allergies as of 03/24/2019      Reactions   Sinemet [carbidopa W-levodopa] Other (See Comments)   Feels "jittery"   Codeine Palpitations   Tetracyclines & Related Rash      Medication List    STOP taking these medications   dexamethasone 6 MG tablet Commonly known as: DECADRON     TAKE these medications   busPIRone 7.5 MG tablet Commonly known as: BUSPAR Take 1 tablet (7.5 mg total) by mouth 2 (two) times daily.   candesartan 4 MG tablet Commonly known as: ATACAND Take 4 mg by mouth daily.   escitalopram 10 MG tablet Commonly known as: LEXAPRO Take 1 tablet (10 mg total) by mouth daily.   ezetimibe 10 MG tablet Commonly known as: ZETIA Take 1 tablet (10 mg total) by mouth daily.   levothyroxine 75 MCG tablet Commonly known as: SYNTHROID Take 75 mcg by mouth daily.   triamterene-hydrochlorothiazide 37.5-25 MG tablet Commonly known as: MAXZIDE-25 Take 1 tablet by mouth daily.       Allergies  Allergen Reactions  . Sinemet [Carbidopa W-Levodopa] Other (See Comments)    Feels "jittery"  . Codeine Palpitations  . Tetracyclines & Related Rash    Consultations:     Procedures/Studies: Ct Angio Chest Pe W And/or Wo Contrast  Result Date: 03/16/2019 CLINICAL DATA:  Shortness of breath, COVID-19 positive. EXAM: CT ANGIOGRAPHY CHEST WITH CONTRAST TECHNIQUE: Multidetector CT imaging of the chest was performed using the standard protocol during bolus administration of intravenous contrast. Multiplanar CT image reconstructions and MIPs were obtained to evaluate the vascular anatomy. CONTRAST:  75mL OMNIPAQUE IOHEXOL 350 MG/ML SOLN COMPARISON:  CTA November 04, 2018 FINDINGS: Cardiovascular: Satisfactory opacification of the pulmonary arteries to the lobar level. More distal  evaluation limited by respiratory motion and underlying parenchymal disease. No pulmonary artery filling defects are seen to the lobar level. Pulmonary arterial caliber is upper limits normal, unchanged from comparison. Atherosclerotic plaque of the nonaneurysmal thoracic aorta. Normal 3 vessel branching of the arch with scant calcification in the proximal great vessels. Mild cardiomegaly with biatrial enlargement. No pericardial effusion. No elevation of the RV/LV ratio (0.75). Mediastinum/Nodes: No enlarged mediastinal, hilar or axillary lymph nodes. Thyroid gland, trachea, and esophagus demonstrate no significant findings. Lungs/Pleura: Multifocal areas peripheral predominant ground-glass are present in the lungs. No pneumothorax or effusion. Mild septal thickening noted in the apices and bases. Diffuse airways thickening and scattered secretions. Upper Abdomen: Non rotation of both kidneys, anatomic variant. Partial fatty replacement of the pancreas. Slight reflux of contrast into the hepatic veins. Musculoskeletal: Multilevel degenerative changes are present in the imaged portions of the spine. No acute osseous abnormality or suspicious osseous lesion. No concerning chest wall lesions S-shaped curvature of the thoracolumbar spine vertebroplasty changes at L1, partially visualized. Review of the MIP images confirms the above findings. IMPRESSION: 1. No evidence of pulmonary embolism to the lobar level. More distal evaluation limited by respiratory motion and underlying parenchymal disease. 2. Multifocal areas of peripheral predominant ground-glass in the lungs, compatible with atypical infection in the setting of known COVID-19 positivity. 3. Some septal thickening in the apices may reflect superimposed edema. 4. Aortic Atherosclerosis (ICD10-I70.0). Electronically Signed   By: Kreg Shropshire M.D.   On: 03/16/2019 21:15   Dg Chest Portable 1 View  Result Date: 03/16/2019 CLINICAL DATA:  Initial evaluation for  acute cough, weakness, OB  positive. EXAM: PORTABLE CHEST 1 VIEW COMPARISON:  Prior radiograph from 11/11/2018. FINDINGS: Cardiac and mediastinal silhouettes within normal limits. Lungs mildly hypoinflated. Linear atelectatic changes noted within the right perihilar region, lingula, and left lung base. No other focal airspace disease. No edema or effusion. No pneumothorax. No acute osseous finding. IMPRESSION: 1. Mild scattered subsegmental atelectasis within the right perihilar region and left lung base. 2. No other radiographic evidence for active cardiopulmonary disease. Electronically Signed   By: Rise Mu M.D.   On: 03/16/2019 19:15   Vas [place] Lower Extremity Venous (dvt)  Result Date: 03/20/2019  Lower Venous Study Indications: Elevated Ddimer.  Risk Factors: COVID 19 positive. Limitations: Poor ultrasound/tissue interface. Comparison Study: No prior studies. Performing Technologist: Chanda Busing RVT  Examination Guidelines: A complete evaluation includes B-mode imaging, spectral Doppler, color Doppler, and power Doppler as needed of all accessible portions of each vessel. Bilateral testing is considered an integral part of a complete examination. Limited examinations for reoccurring indications may be performed as noted.  +---------+---------------+---------+-----------+----------+--------------+ RIGHT    CompressibilityPhasicitySpontaneityPropertiesThrombus Aging +---------+---------------+---------+-----------+----------+--------------+ CFV      Full           Yes      Yes                                 +---------+---------------+---------+-----------+----------+--------------+ SFJ      Full                                                        +---------+---------------+---------+-----------+----------+--------------+ FV Prox  Full                                                        +---------+---------------+---------+-----------+----------+--------------+  FV Mid   Full                                                        +---------+---------------+---------+-----------+----------+--------------+ FV DistalFull                                                        +---------+---------------+---------+-----------+----------+--------------+ PFV      Full                                                        +---------+---------------+---------+-----------+----------+--------------+ POP      Full           Yes      Yes                                 +---------+---------------+---------+-----------+----------+--------------+  PTV      Full                                                        +---------+---------------+---------+-----------+----------+--------------+ PERO     Full                                                        +---------+---------------+---------+-----------+----------+--------------+   +---------+---------------+---------+-----------+----------+--------------+ LEFT     CompressibilityPhasicitySpontaneityPropertiesThrombus Aging +---------+---------------+---------+-----------+----------+--------------+ CFV      Full           Yes      Yes                                 +---------+---------------+---------+-----------+----------+--------------+ SFJ      Full                                                        +---------+---------------+---------+-----------+----------+--------------+ FV Prox  Full                                                        +---------+---------------+---------+-----------+----------+--------------+ FV Mid   Full                                                        +---------+---------------+---------+-----------+----------+--------------+ FV DistalFull                                                        +---------+---------------+---------+-----------+----------+--------------+ PFV      Full                                                         +---------+---------------+---------+-----------+----------+--------------+ POP      Full           Yes      Yes                                 +---------+---------------+---------+-----------+----------+--------------+ PTV      Full                                                        +---------+---------------+---------+-----------+----------+--------------+  PERO     Full                                                        +---------+---------------+---------+-----------+----------+--------------+     Summary: Right: There is no evidence of deep vein thrombosis in the lower extremity. No cystic structure found in the popliteal fossa. Left: There is no evidence of deep vein thrombosis in the lower extremity. No cystic structure found in the popliteal fossa.  *See table(s) above for measurements and observations. Electronically signed by Waverly Ferrarihristopher Dickson MD on 03/20/2019 at 11:30:52 AM.    Final       Procedures:   Subjective: Patient with no dyspnea, no chest pain, no nausea or vomiting. Patient continue to have generalized weakness, and mild anxiety.   Discharge Exam: Vitals:   03/24/19 0457 03/24/19 0741  BP: 124/72 110/61  Pulse: 65 82  Resp: 18 20  Temp: 97.9 F (36.6 C) 98.1 F (36.7 C)  SpO2: 99% 95%   Vitals:   03/23/19 1803 03/23/19 1930 03/24/19 0457 03/24/19 0741  BP: 110/71 121/69 124/72 110/61  Pulse:  71 65 82  Resp:  18 18 20   Temp:  98.8 F (37.1 C) 97.9 F (36.6 C) 98.1 F (36.7 C)  TempSrc:  Oral  Oral  SpO2:  96% 99% 95%  Weight:      Height:        General: Not in pain or dyspnea.  Neurology: Awake and alert, non focal  E ENT: no pallor, no icterus, oral mucosa moist Cardiovascular: No JVD. S1-S2 present, rhythmic, no gallops, rubs, or murmurs. No lower extremity edema. Pulmonary: positive breath sounds bilaterally. Gastrointestinal. Abdomen with no organomegaly, non tender, no rebound or  guarding Skin. No rashes Musculoskeletal: no joint deformities   The results of significant diagnostics from this hospitalization (including imaging, microbiology, ancillary and laboratory) are listed below for reference.     Microbiology: Recent Results (from the past 240 hour(s))  Culture, Urine     Status: Abnormal   Collection Time: 03/22/19 10:45 AM   Specimen: Urine, Clean Catch  Result Value Ref Range Status   Specimen Description   Final    URINE, CLEAN CATCH Performed at [facility], [address] [place], [place] [address]    Special Requests   Final    NONE Performed at [facility], [address] [place], [place] [address]    Culture MULTIPLE SPECIES PRESENT, SUGGEST RECOLLECTION (A)  Final   Report Status 03/23/2019 FINAL  Final     Labs: BNP (last 3 results) Recent Labs    11/11/18 0926 03/17/19 0450  BNP 26.6 119.6*   Basic Metabolic Panel: Recent Labs  Lab 03/18/19 0145 03/19/19 0010 03/20/19 0005 03/22/19 0845 03/23/19 0135  NA 137 136 138 137  --   K 3.9 3.9 3.8 4.3  --   CL 102 99 101 102  --   CO2 25 26 25 27   --   GLUCOSE 93 106* 85 134*  --   BUN 40* 46* 38* 31*  --   CREATININE 1.03* 1.02* 0.93 0.86 0.93  CALCIUM 9.3 9.7 9.4 8.9  --    Liver Function Tests: Recent Labs  Lab 03/18/19 0145 03/19/19 0010 03/20/19 0005 03/22/19 0845  AST 37 29 22 30  ALT 65* 52* 40 36  ALKPHOS 71 74 65 68  BILITOT 1.0 0.5 0.5 1.0  PROT 6.5 6.5 6.3* 6.6  ALBUMIN 3.2* 3.1* 3.1* 3.3*   No results for input(s): LIPASE, AMYLASE in the last 168 hours. No results for input(s): AMMONIA in the last 168 hours. CBC: Recent Labs  Lab 03/22/19 0845  WBC 10.1  HGB 14.1  HCT 40.6  MCV 99.3  PLT 366   Cardiac Enzymes: No results for input(s): CKTOTAL, CKMB, CKMBINDEX, TROPONINI in the last 168 hours. BNP: Invalid input(s): POCBNP CBG: No results for input(s): GLUCAP in the last 168 hours. D-Dimer No  results for input(s): DDIMER in the last 72 hours. Hgb A1c No results for input(s): HGBA1C in the last 72 hours. Lipid Profile No results for input(s): CHOL, HDL, LDLCALC, TRIG, CHOLHDL, LDLDIRECT in the last 72 hours. Thyroid function studies No results for input(s): TSH, T4TOTAL, T3FREE, THYROIDAB in the last 72 hours.  Invalid input(s): FREET3 Anemia work up Recent Labs    03/24/19 0005  VITAMINB12 868   Urinalysis    Component Value Date/Time   COLORURINE YELLOW 03/22/2019 1045   APPEARANCEUR CLEAR 03/22/2019 1045   LABSPEC 1.024 03/22/2019 1045   PHURINE 5.0 03/22/2019 1045   GLUCOSEU NEGATIVE 03/22/2019 1045   HGBUR NEGATIVE 03/22/2019 1045   BILIRUBINUR NEGATIVE 03/22/2019 1045   KETONESUR NEGATIVE 03/22/2019 1045   PROTEINUR NEGATIVE 03/22/2019 1045   NITRITE NEGATIVE 03/22/2019 1045   LEUKOCYTESUR MODERATE (A) 03/22/2019 1045   Sepsis Labs Invalid input(s): PROCALCITONIN,  WBC,  LACTICIDVEN Microbiology Recent Results (from the past 240 hour(s))  Culture, Urine     Status: Abnormal   Collection Time: 03/22/19 10:45 AM   Specimen: Urine, Clean Catch  Result Value Ref Range Status   Specimen Description   Final    URINE, CLEAN CATCH Performed at [facility], [address] [place], [place] [address]    Special Requests   Final    NONE Performed at [facility], [address], [place], [place] [address]    Culture MULTIPLE SPECIES PRESENT, SUGGEST RECOLLECTION (A)  Final   Report Status 03/23/2019 FINAL  Final     Time coordinating discharge: 45 minutes  SIGNED:   Coralie Keens, MD  [facility] 03/24/2019, 8:28 AM

## 2019-03-25 ENCOUNTER — Encounter (HOSPITAL_COMMUNITY): Payer: Self-pay

## 2019-03-25 ENCOUNTER — Inpatient Hospital Stay (HOSPITAL_COMMUNITY): Payer: Medicare Other

## 2019-03-25 DIAGNOSIS — R531 Weakness: Secondary | ICD-10-CM

## 2019-03-25 LAB — BASIC METABOLIC PANEL
Anion gap: 12 (ref 5–15)
BUN: 31 mg/dL — ABNORMAL HIGH (ref 8–23)
CO2: 23 mmol/L (ref 22–32)
Calcium: 9.4 mg/dL (ref 8.9–10.3)
Chloride: 104 mmol/L (ref 98–111)
Creatinine, Ser: 0.94 mg/dL (ref 0.44–1.00)
GFR calc Af Amer: >60 mL/min (ref >60)
GFR calc non Af Amer: 60 mL/min — ABNORMAL LOW (ref >60)
Glucose, Bld: 109 mg/dL — ABNORMAL HIGH (ref 70–99)
Potassium: 3.9 mmol/L (ref 3.5–5.1)
Sodium: 139 mmol/L (ref 135–145)

## 2019-03-25 LAB — C-REACTIVE PROTEIN: CRP: <0.8 mg/dL (ref <1.0)

## 2019-03-25 LAB — POCT I-STAT 7, (LYTES, BLD GAS, ICA,H+H)
Acid-Base Excess: 1 mmol/L (ref 0.0–2.0)
Bicarbonate: 23.8 mmol/L (ref 20.0–28.0)
Bicarbonate: 23.8 mmol/L (ref 20.0–28.0)
Calcium, Ion: 1.3 mmol/L (ref 1.15–1.40)
Calcium, Ion: 1.29 mmol/L (ref 1.15–1.40)
HCT: 35 % — ABNORMAL LOW (ref 36.0–46.0)
HCT: 35 % — ABNORMAL LOW (ref 36.0–46.0)
Hemoglobin: 11.9 g/dL — ABNORMAL LOW (ref 12.0–15.0)
Hemoglobin: 11.9 g/dL — ABNORMAL LOW (ref 12.0–15.0)
O2 Saturation: 94 %
O2 Saturation: 94 %
Patient temperature: 97.6
Patient temperature: 36.7
Potassium: 3.8 mmol/L (ref 3.5–5.1)
Potassium: 4 mmol/L (ref 3.5–5.1)
Sodium: 138 mmol/L (ref 135–145)
Sodium: 138 mmol/L (ref 135–145)
TCO2: 25 mmol/L (ref 22–32)
TCO2: 25 mmol/L (ref 22–32)
pCO2 arterial: 31 mmHg — ABNORMAL LOW (ref 32.0–48.0)
pCO2 arterial: 34.8 mmHg (ref 32.0–48.0)
pH, Arterial: 7.492 — ABNORMAL HIGH (ref 7.350–7.450)
pH, Arterial: 7.443 (ref 7.350–7.450)
pO2, Arterial: 64 mmHg — ABNORMAL LOW (ref 83.0–108.0)
pO2, Arterial: 68 mmHg — ABNORMAL LOW (ref 83.0–108.0)

## 2019-03-25 LAB — CBC
HCT: 39 % (ref 36.0–46.0)
Hemoglobin: 13.4 g/dL (ref 12.0–15.0)
MCH: 33.7 pg (ref 26.0–34.0)
MCHC: 34.4 g/dL (ref 30.0–36.0)
MCV: 98 fL (ref 80.0–100.0)
Platelets: 317 10*3/uL (ref 150–400)
RBC: 3.98 MIL/uL (ref 3.87–5.11)
RDW: 12.6 % (ref 11.5–15.5)
WBC: 6.4 10*3/uL (ref 4.0–10.5)
nRBC: 0 % (ref 0.0–0.2)

## 2019-03-25 LAB — GLUCOSE, CAPILLARY
Glucose-Capillary: 106 mg/dL — ABNORMAL HIGH (ref 70–99)
Glucose-Capillary: 108 mg/dL — ABNORMAL HIGH (ref 70–99)
Glucose-Capillary: 174 mg/dL — ABNORMAL HIGH (ref 70–99)

## 2019-03-25 LAB — CK TOTAL AND CKMB (NOT AT ARMC)
CK, MB: 1.3 ng/mL (ref 0.5–5.0)
Relative Index: INVALID (ref 0.0–2.5)
Total CK: 24 U/L — ABNORMAL LOW (ref 38–234)

## 2019-03-25 LAB — D-DIMER, QUANTITATIVE: D-Dimer, Quant: 0.49 ug/mL-FEU (ref 0.00–0.50)

## 2019-03-25 LAB — MRSA PCR SCREENING: MRSA by PCR: NEGATIVE

## 2019-03-25 LAB — LACTIC ACID, PLASMA: Lactic Acid, Venous: 1.1 mmol/L (ref 0.5–1.9)

## 2019-03-25 LAB — PROCALCITONIN: Procalcitonin: <0.1 ng/mL

## 2019-03-25 IMAGING — CT CT ANGIO NECK
2 of 4 series · 10 of 16 positions shown · IV contrast (OMNIPAQUE)
Comparison: Head CT from earlier today

CLINICAL DATA: Altered mental status.  Hemorrhage suspected



[Series 12: (person_name) 1.0 b30s · axial · 0.46mm/px · z∈[+349,+612]mm · 8 of 339 slices shown]
[im 38/339  soft-tissue]
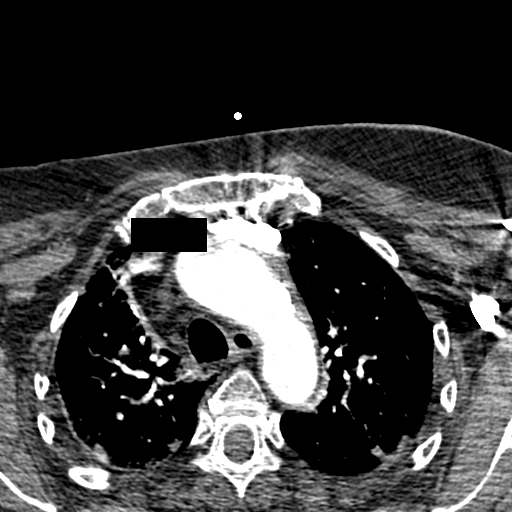
[im 76/339  bone]
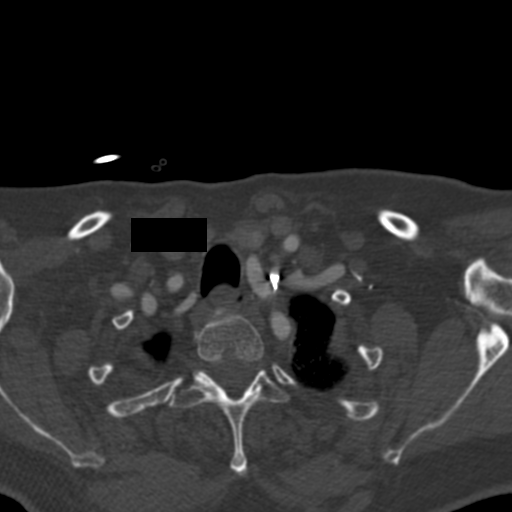
[im 113/339  soft-tissue]
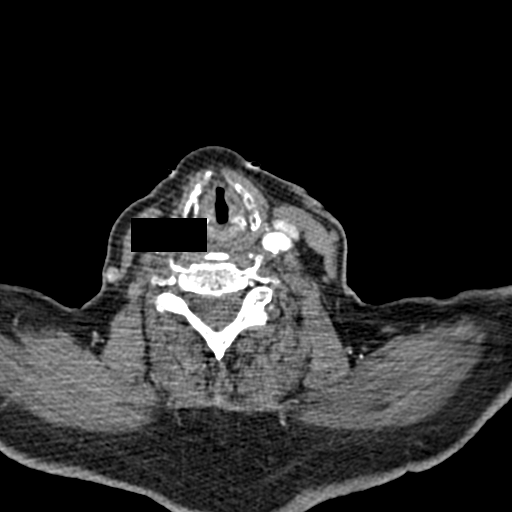
[im 151/339  bone]
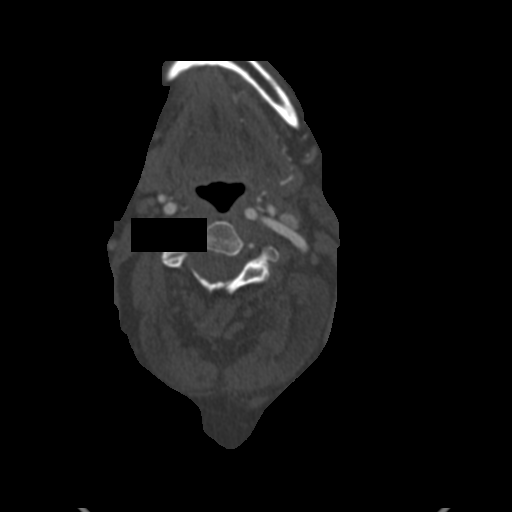
[im 188/339  soft-tissue]
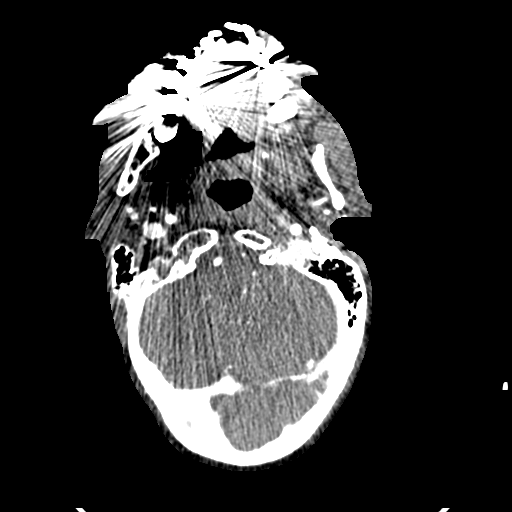
[im 226/339  bone]
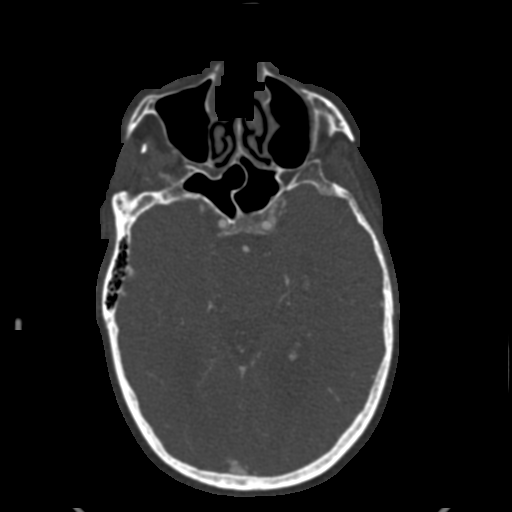
[im 263/339  soft-tissue]
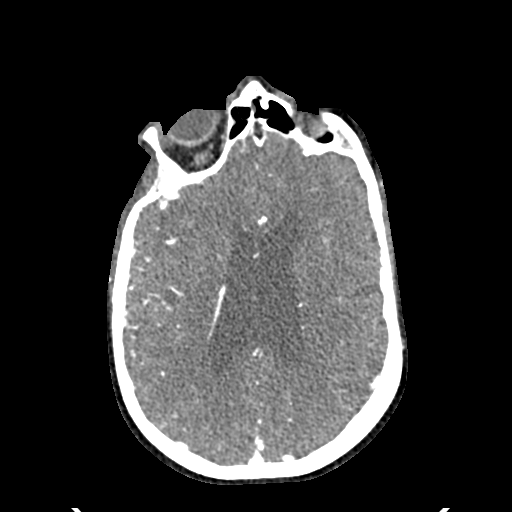
[im 301/339  bone]
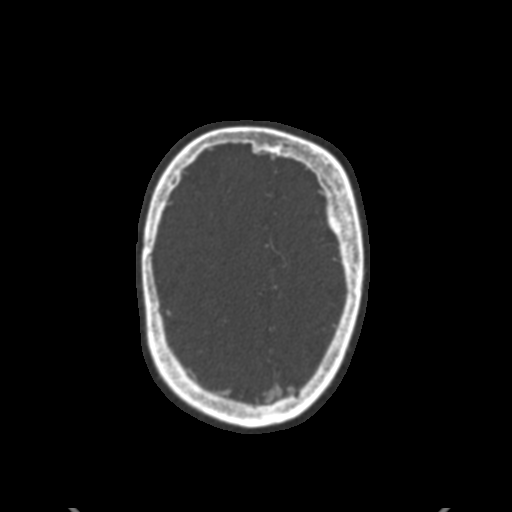

[Series 13: (person_name) 2.0 b20s · axial · 0.47mm/px · z∈[+424,+536]mm · 2 of 170 slices shown]
[im 57/170  soft-tissue]
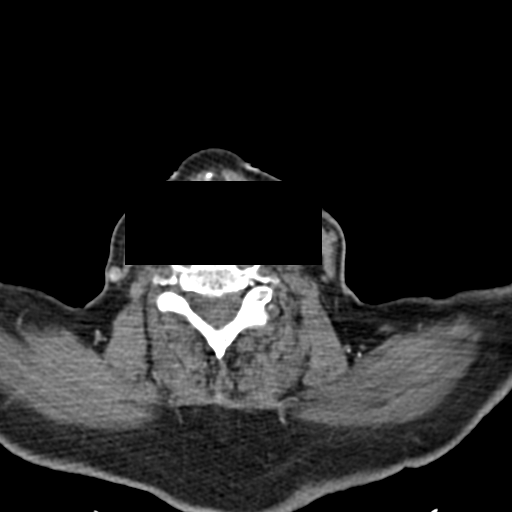
[im 113/170  soft-tissue]
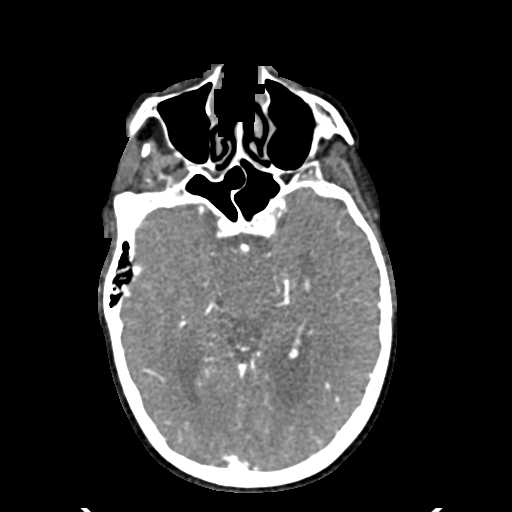

[10 of 16 positions shown; findings below may reference images not displayed]

FINDINGS: CTA NECK FINDINGS

Aortic arch: Atherosclerotic plaque.  Three vessel branching.

Right carotid system: Tortuous ICA. Vessels are smooth and widely
patent. No atheromatous changes.

Left carotid system: Atherosclerotic calcification about the
bifurcation that is mild for age. No stenosis or ulceration.

Vertebral arteries: No brachiocephalic or proximal subclavian flow
limiting stenosis. Strong dominance of the right vertebral artery
which is smooth and widely patent to the dura. A diminutive left
vertebral artery is also patent to the dura although most flow is
into the left occipital soft tissues.

Skeleton: No acute or aggressive finding

Other neck: Negative

Upper chest: Patchy airspace disease in the upper lungs correlating
with history of [XB] positivity

Review of the MIP images confirms the above findings

CTA HEAD FINDINGS

Anterior circulation: Vessels are smooth and widely patent. No
branch occlusion, beading, or aneurysm.

Posterior circulation: Strong right vertebral artery dominance. A
tiny left vertebral artery does reach the basilar. Aplastic right
and hypoplastic left P1 segment. No branch occlusion, beading, or
aneurysm

Venous sinuses: Patent

Anatomic variants: As above

Review of the MIP images confirms the above findings
IMPRESSION: 1. No emergent vascular finding.
2. Biapical airspace disease correlating with history of [XB]
positivity.
3. Mild for age atherosclerosis.

## 2019-03-25 IMAGING — CT CT ANGIO HEAD
1 of 4 series · 17 of 30 positions shown · IV contrast (OMNIPAQUE)
Comparison: Head CT from earlier today

CLINICAL DATA: Altered mental status.  Hemorrhage suspected



[Series 12: (person_name) 1.0 b30s · axial · 0.46mm/px · z∈[+330,+631]mm · 17 of 339 slices shown]
[im 19/339  brain]
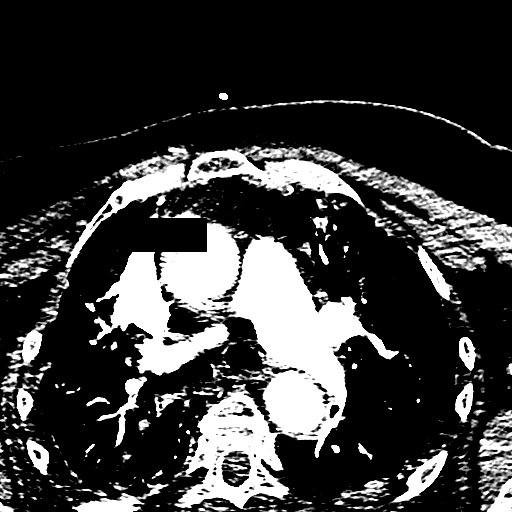
[im 38/339  bone]
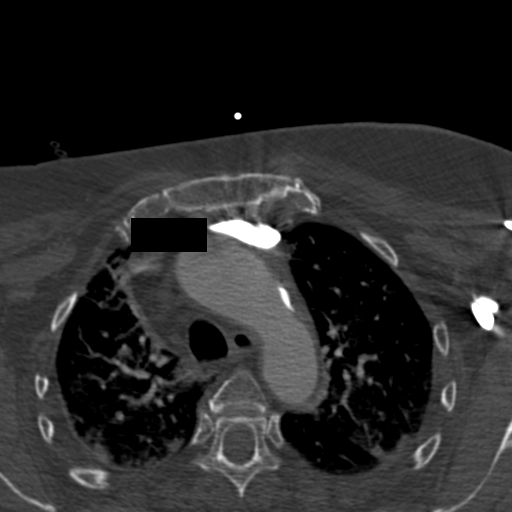
[im 57/339  brain]
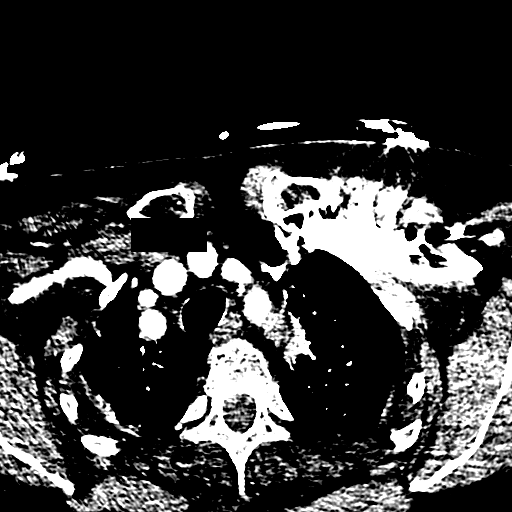
[im 76/339  bone]
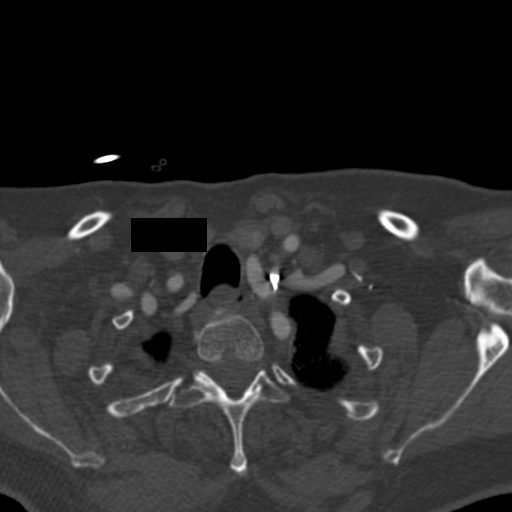
[im 94/339  brain]
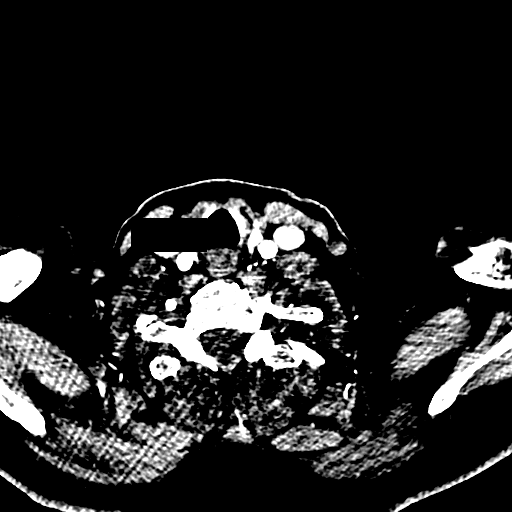
[im 113/339  bone]
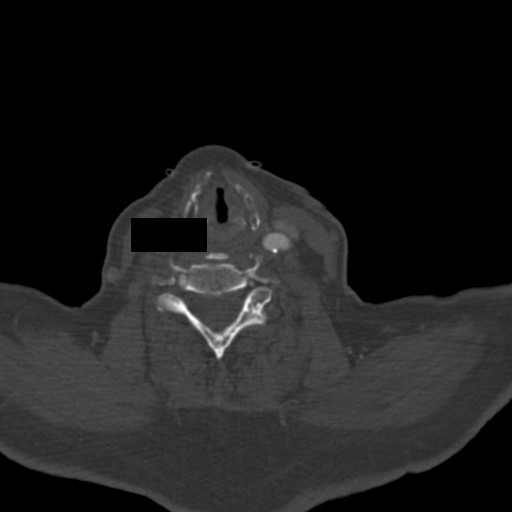
[im 132/339  brain]
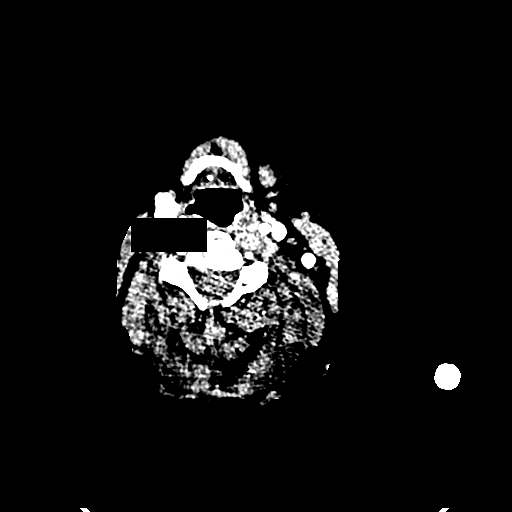
[im 151/339  bone]
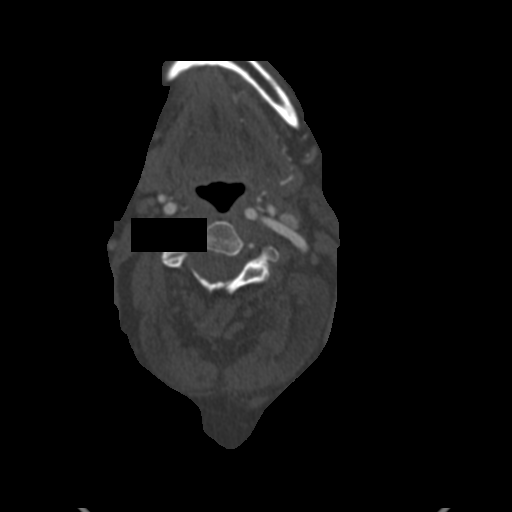
[im 170/339  brain]
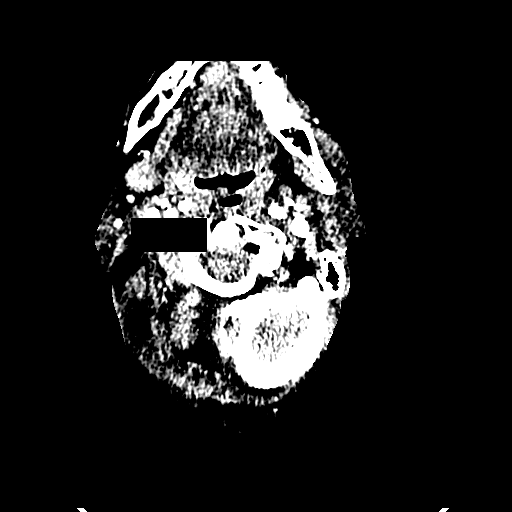
[im 188/339  bone]
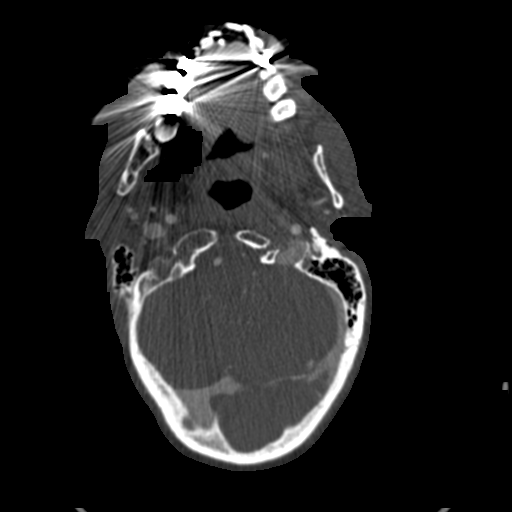
[im 207/339  brain]
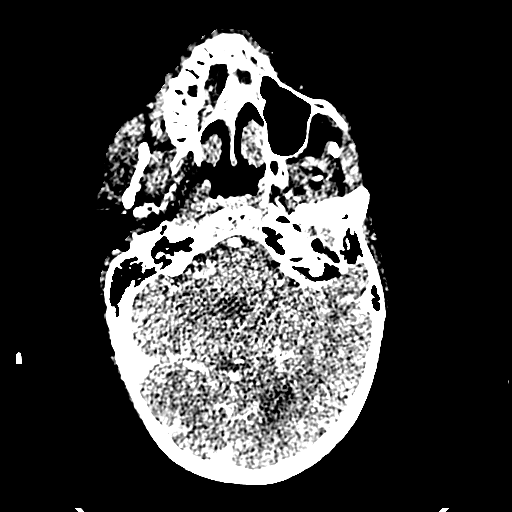
[im 226/339  bone]
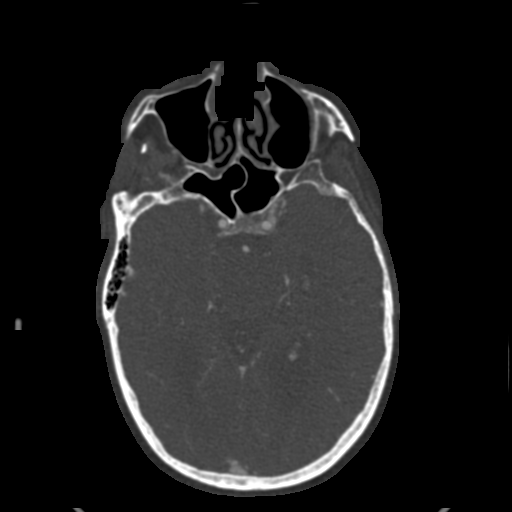
[im 245/339  brain]
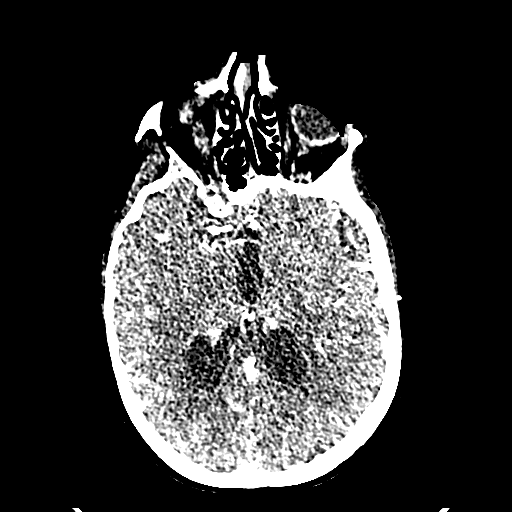
[im 263/339  bone]
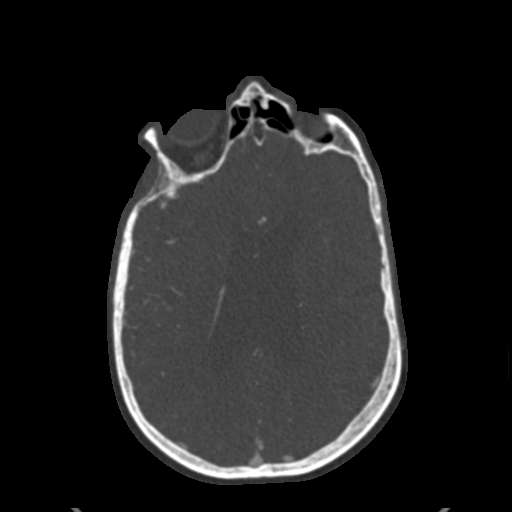
[im 282/339  brain]
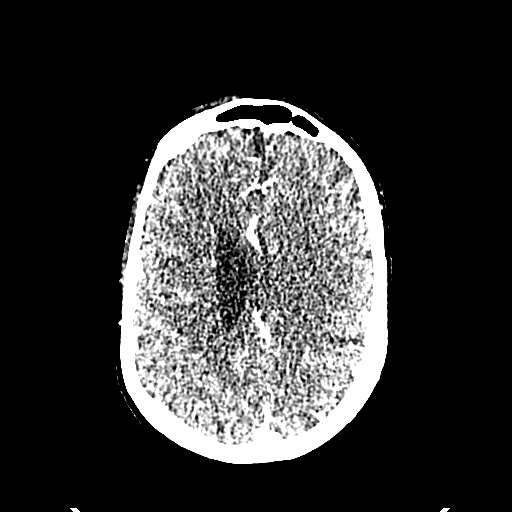
[im 301/339  bone]
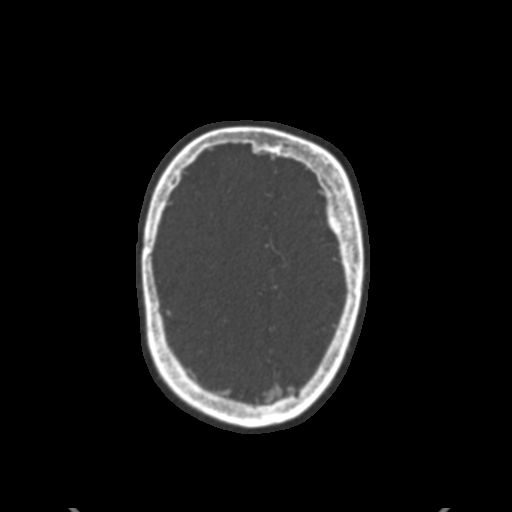
[im 320/339  brain]
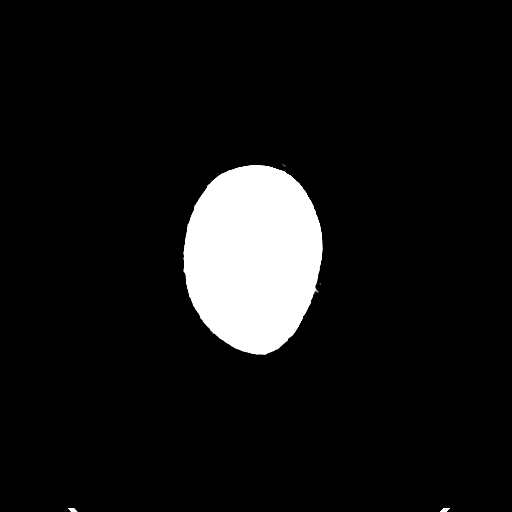

[17 of 30 positions shown; findings below may reference images not displayed]

FINDINGS: CTA NECK FINDINGS

Aortic arch: Atherosclerotic plaque.  Three vessel branching.

Right carotid system: Tortuous ICA. Vessels are smooth and widely
patent. No atheromatous changes.

Left carotid system: Atherosclerotic calcification about the
bifurcation that is mild for age. No stenosis or ulceration.

Vertebral arteries: No brachiocephalic or proximal subclavian flow
limiting stenosis. Strong dominance of the right vertebral artery
which is smooth and widely patent to the dura. A diminutive left
vertebral artery is also patent to the dura although most flow is
into the left occipital soft tissues.

Skeleton: No acute or aggressive finding

Other neck: Negative

Upper chest: Patchy airspace disease in the upper lungs correlating
with history of [XB] positivity

Review of the MIP images confirms the above findings

CTA HEAD FINDINGS

Anterior circulation: Vessels are smooth and widely patent. No
branch occlusion, beading, or aneurysm.

Posterior circulation: Strong right vertebral artery dominance. A
tiny left vertebral artery does reach the basilar. Aplastic right
and hypoplastic left P1 segment. No branch occlusion, beading, or
aneurysm

Venous sinuses: Patent

Anatomic variants: As above

Review of the MIP images confirms the above findings
IMPRESSION: 1. No emergent vascular finding.
2. Biapical airspace disease correlating with history of [XB]
positivity.
3. Mild for age atherosclerosis.

## 2019-03-25 IMAGING — CT CT HEAD CODE STROKE
1 of 3 series · 15 of 30 positions shown, 19 images · non-contrast
Comparison: [DATE]

CLINICAL DATA: Code stroke. Altered mental status with unclear
cause

EXAM:
CT HEAD WITHOUT CONTRAST
TECHNIQUE: Contiguous axial images were obtained from the base of the skull
through the vertex without intravenous contrast.

[Series 4: head w/o st · axial · non-contrast · 0.41mm/px · z∈[+478,+640]mm · 15 of 124 slices shown, 19 images]
[im 8/124  brain]
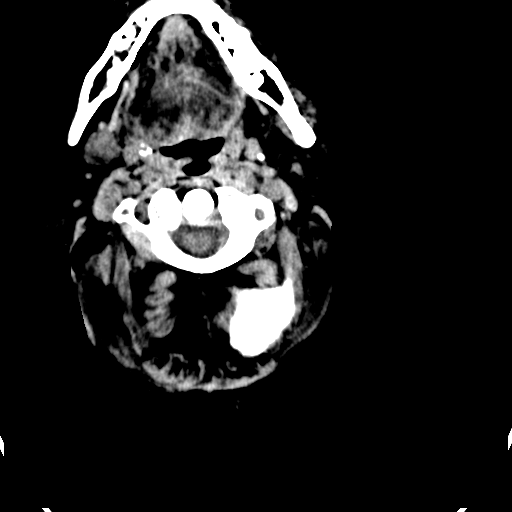
[im 8/124  bone]
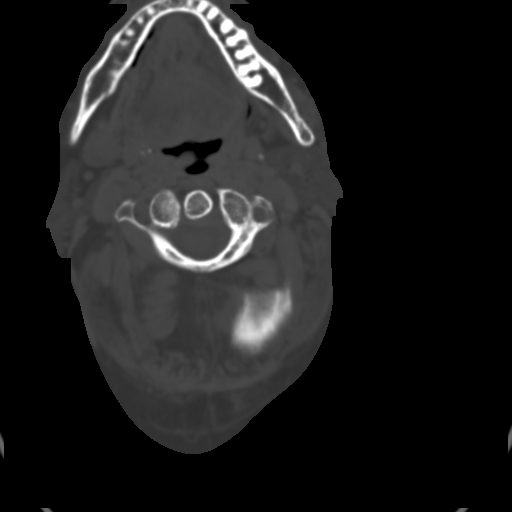
[im 15/124  brain]
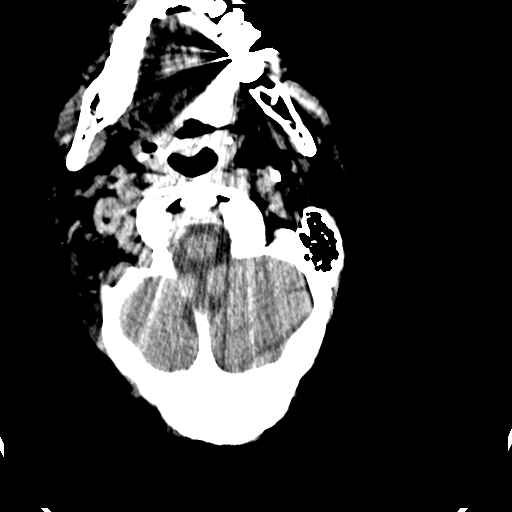
[im 22/124  brain]
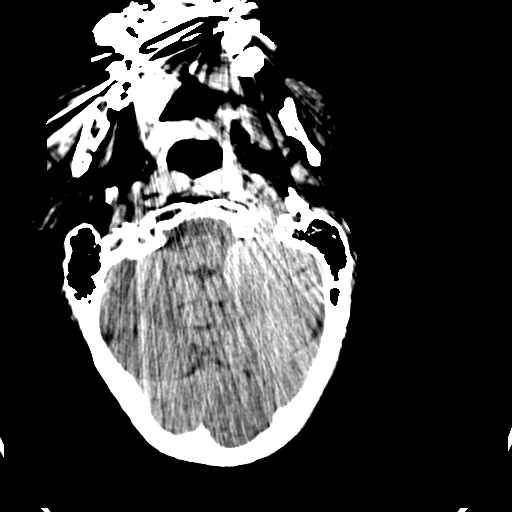
[im 29/124  brain]
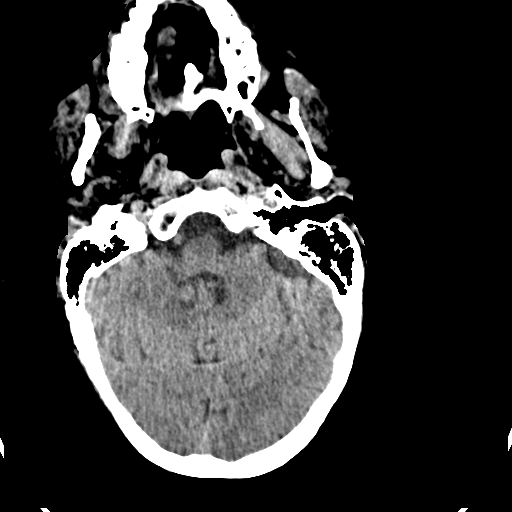
[im 37/124  brain]
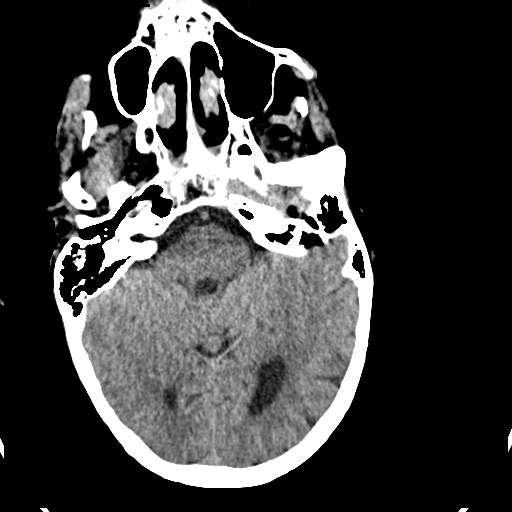
[im 37/124  bone]
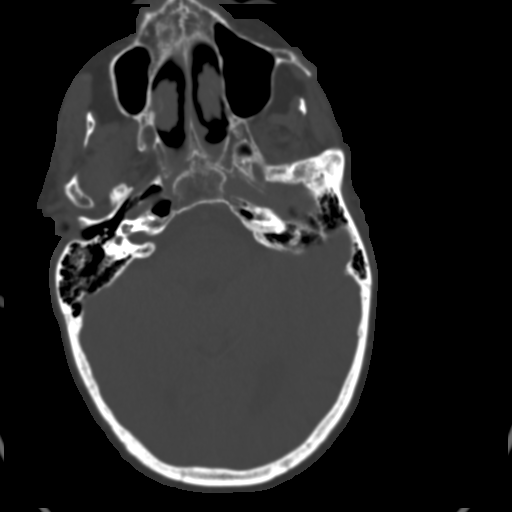
[im 44/124  brain]
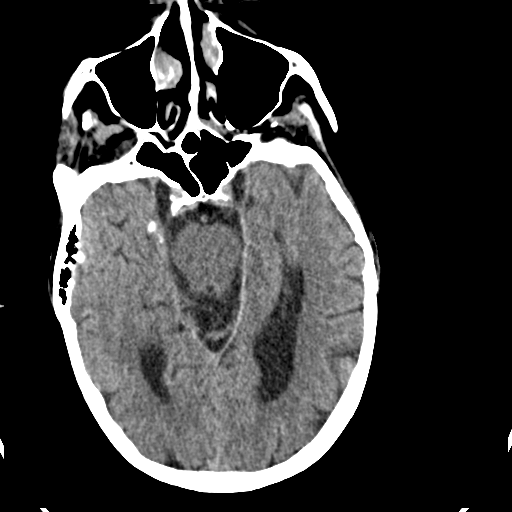
[im 51/124  brain]
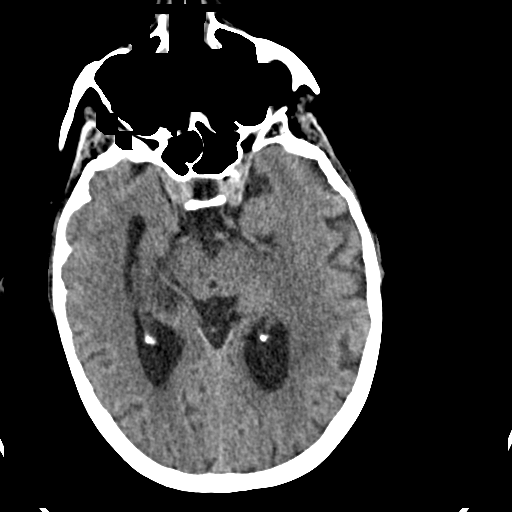
[im 66/124  brain]
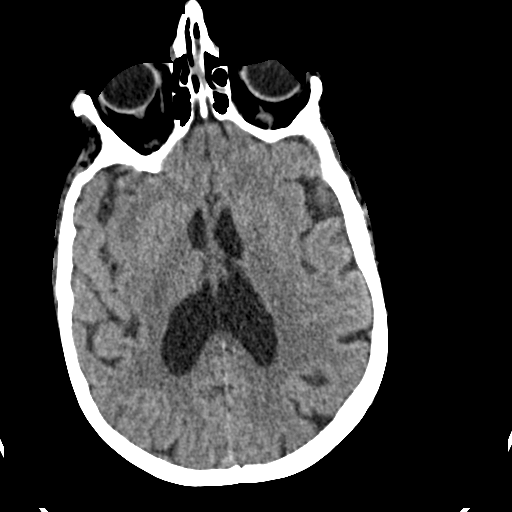
[im 73/124  brain]
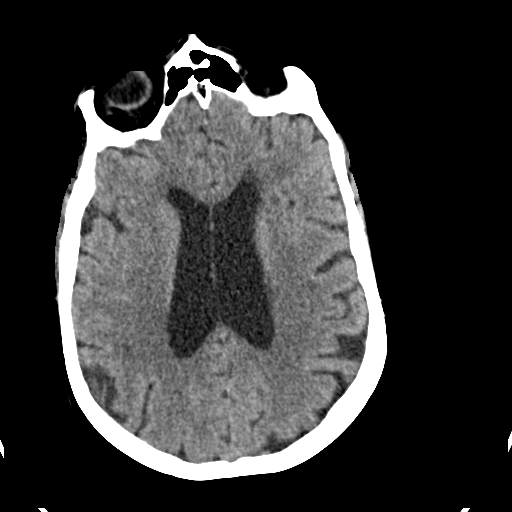
[im 73/124  bone]
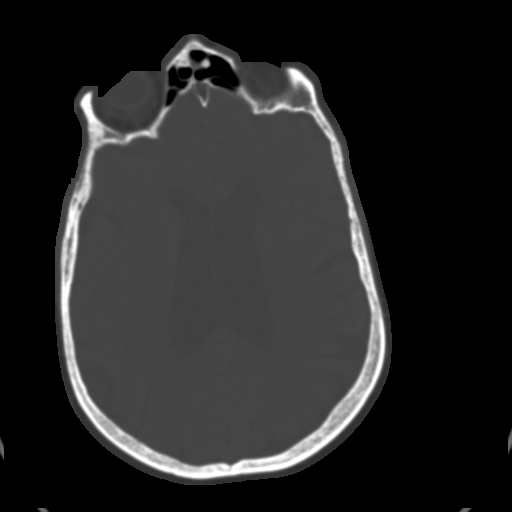
[im 80/124  brain]
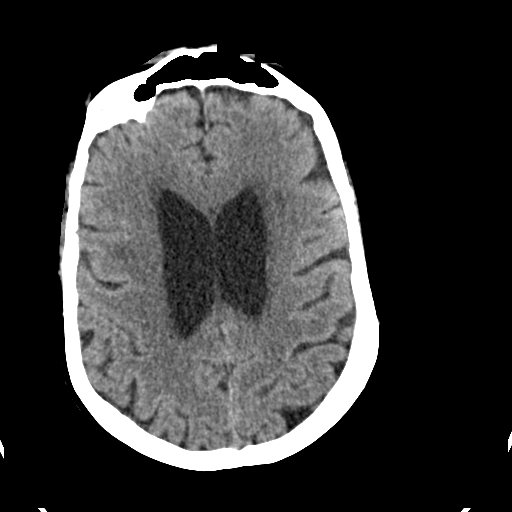
[im 87/124  brain]
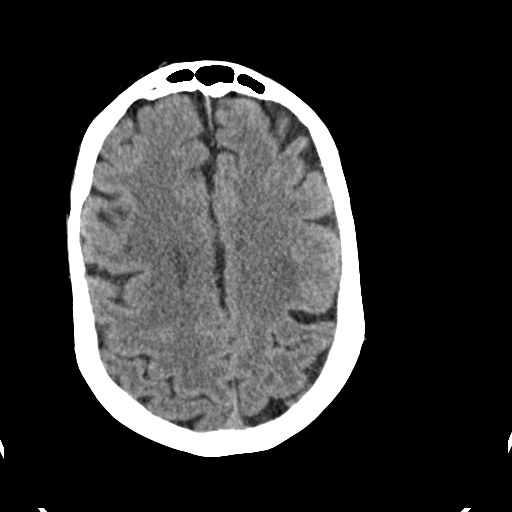
[im 95/124  brain]
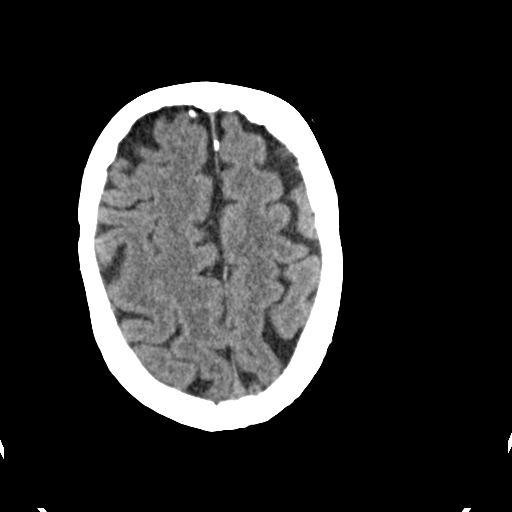
[im 102/124  brain]
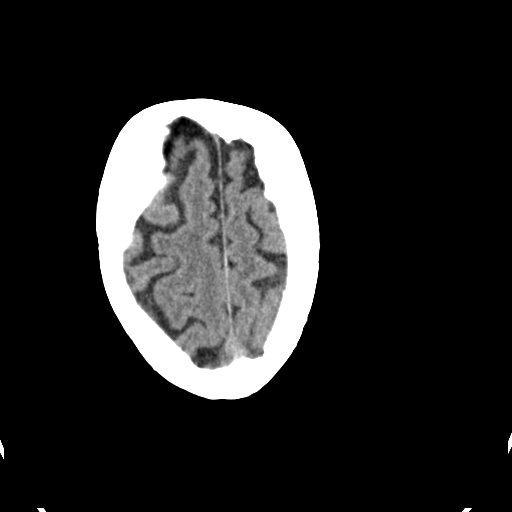
[im 102/124  bone]
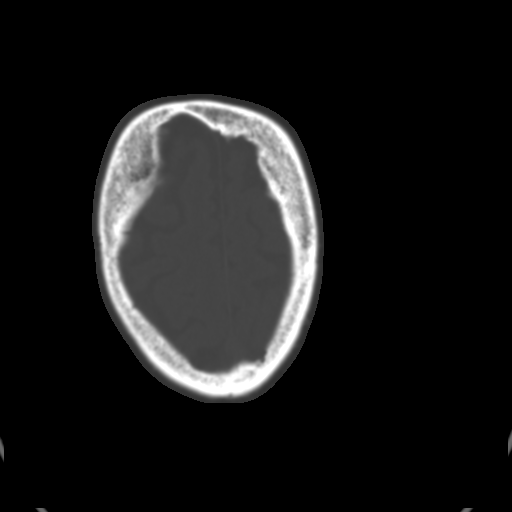
[im 109/124  brain]
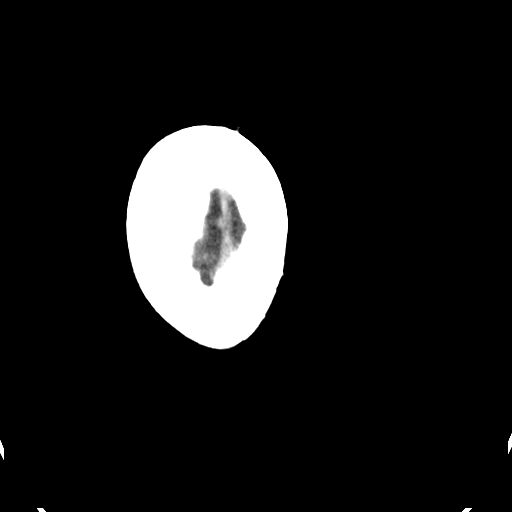
[im 116/124  brain]
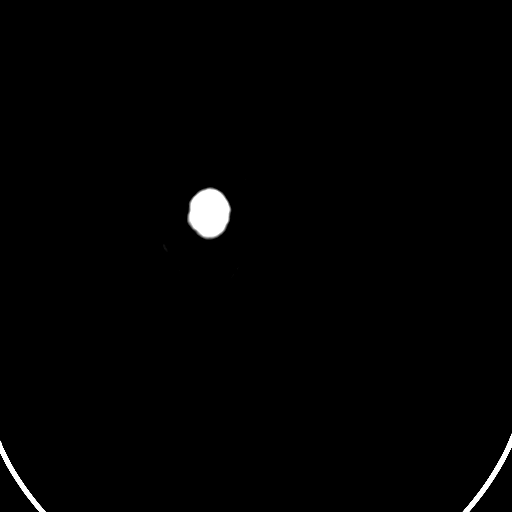

[15 of 30 positions shown; findings below may reference images not displayed]

FINDINGS: Brain: No evidence of acute infarction, hemorrhage, hydrocephalus,
extra-axial collection or mass lesion/mass effect. Mild patchy
low-density in the cerebral white matter attributed to chronic small
vessel ischemia. Central predominant cerebral volume loss.

Vascular: No hyperdense vessel or unexpected calcification.

Skull: Normal. Negative for fracture or focal lesion.

Sinuses/Orbits: Negative

Other: Page was placed [DATE] at [DATE] to provider DREY
.

ASPECTS ([place]berta Stroke Program Early CT Score)

Not scored without localizing symptoms
IMPRESSION: 1. No acute finding.
2. Mild chronic small vessel disease.

## 2019-03-25 IMAGING — DX DG CHEST 1V PORT
1 series · 1 of 1 positions shown · non-contrast
Comparison: [DATE]

CLINICAL DATA: Altered mental status

EXAM:
PORTABLE CHEST 1 VIEW

[chest ap]
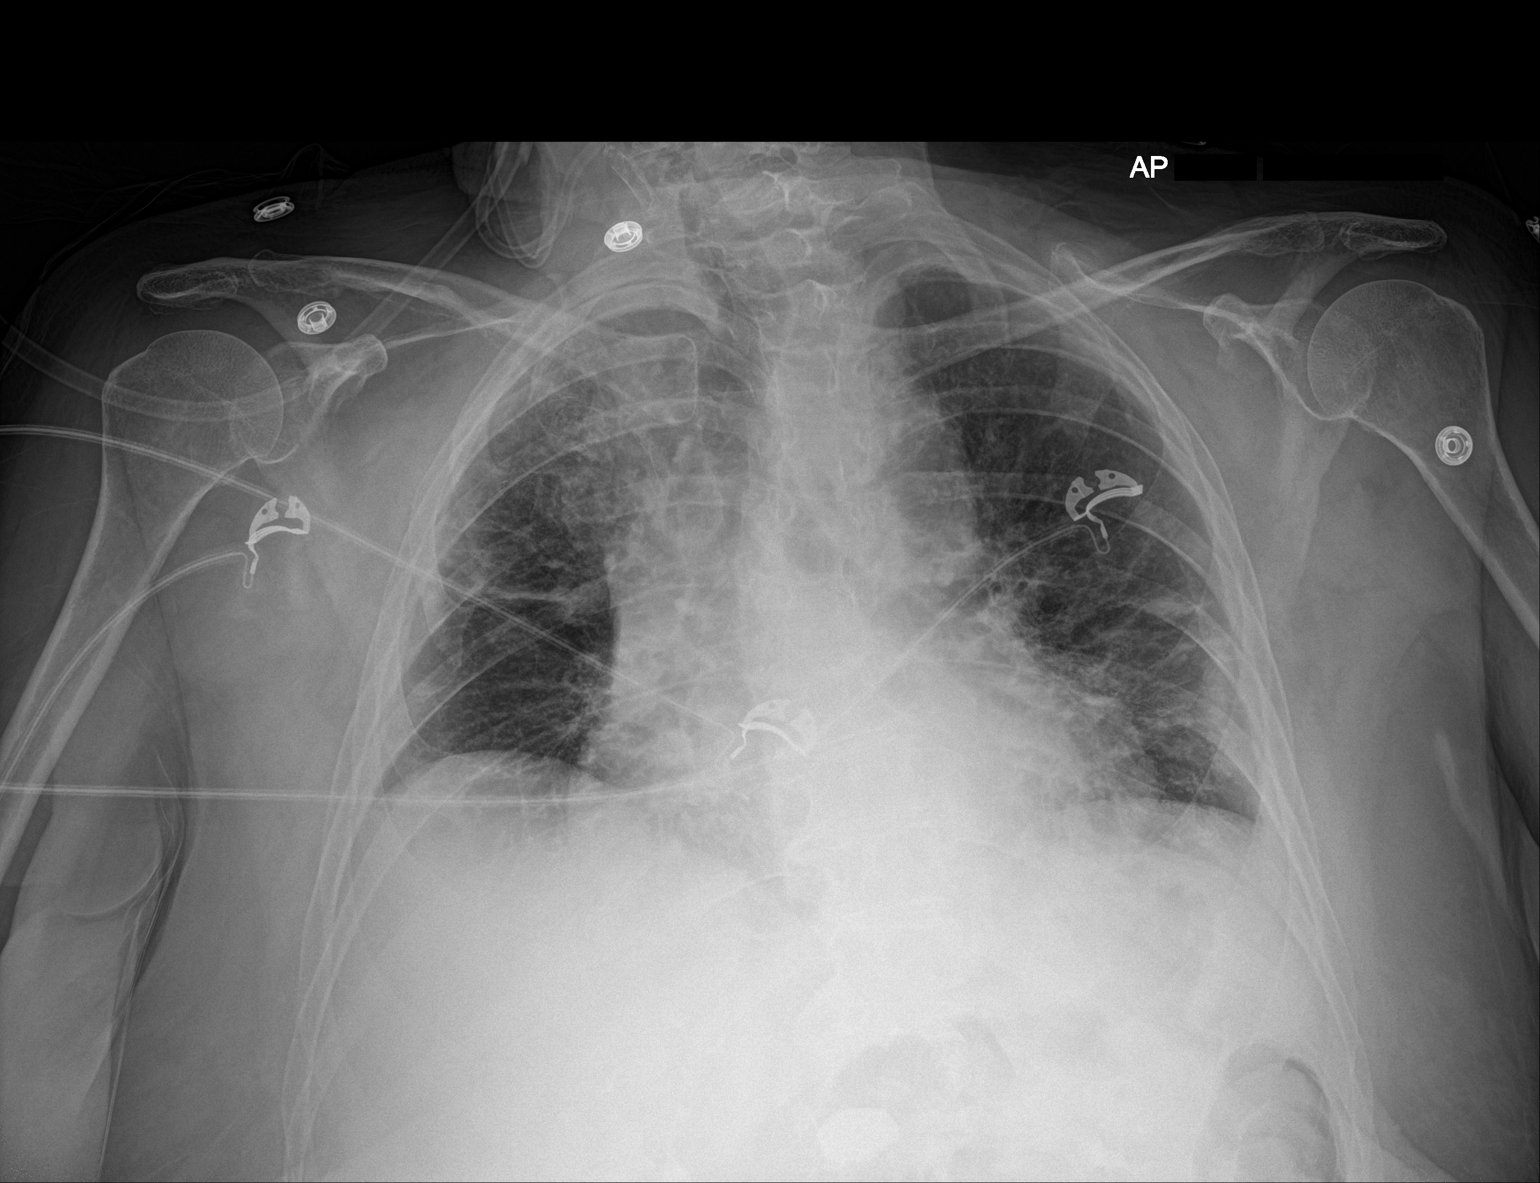

[1 of 1 positions shown; findings below may reference images not displayed]

FINDINGS: Lower volumes and increased patchy bilateral pulmonary opacity.
Cardiomegaly and vascular pedicle widening accentuated by rotation
and lower volumes. No visible effusion or pneumothorax
IMPRESSION: Patchy bilateral infiltrate correlating with history of [VV]
positivity. Opacity and inflation has worsened since [DATE].

## 2019-03-25 MED ORDER — ETOMIDATE 2 MG/ML IV SOLN
INTRAVENOUS | Status: AC
  Filled 2019-03-25: qty 20

## 2019-03-25 MED ORDER — SUCCINYLCHOLINE CHLORIDE 200 MG/10ML IV SOSY
PREFILLED_SYRINGE | INTRAVENOUS | Status: AC
  Filled 2019-03-25: qty 10

## 2019-03-25 MED ORDER — SODIUM CHLORIDE 0.9 % IV SOLN: 100.0000 mg | INTRAVENOUS | Status: DC

## 2019-03-25 MED ORDER — PROPOFOL 10 MG/ML IV BOLUS
INTRAVENOUS | Status: AC
  Filled 2019-03-25: qty 20

## 2019-03-25 MED ORDER — ROCURONIUM BROMIDE 10 MG/ML (PF) SYRINGE
PREFILLED_SYRINGE | INTRAVENOUS | Status: AC
  Filled 2019-03-25: qty 10

## 2019-03-25 MED ORDER — FENTANYL CITRATE (PF) 100 MCG/2ML IJ SOLN
INTRAMUSCULAR | Status: AC
  Filled 2019-03-25: qty 2

## 2019-03-25 MED ORDER — DEXAMETHASONE SODIUM PHOSPHATE 10 MG/ML IJ SOLN
8.0000 mg | INTRAMUSCULAR | Status: DC
  Administered 2019-03-25: 07:00:00 8 mg via INTRAVENOUS
  Filled 2019-03-25: qty 1

## 2019-03-25 MED ORDER — SODIUM CHLORIDE 0.9 % IV SOLN
200.0000 mg | Freq: Once | INTRAVENOUS | Status: AC
  Administered 2019-03-25: 09:00:00 200 mg via INTRAVENOUS
  Filled 2019-03-25: qty 40

## 2019-03-25 MED ORDER — IOHEXOL 350 MG/ML SOLN
80.0000 mL | Freq: Once | INTRAVENOUS | Status: AC | PRN
  Administered 2019-03-25: 05:00:00 80 mL via INTRAVENOUS

## 2019-03-25 MED ORDER — STERILE WATER FOR INJECTION IJ SOLN
INTRAMUSCULAR | Status: AC
  Filled 2019-03-25: qty 10

## 2019-03-25 MED ORDER — CHLORHEXIDINE GLUCONATE CLOTH 2 % EX PADS
6.0000 | MEDICATED_PAD | Freq: Every day | CUTANEOUS | Status: DC
  Administered 2019-03-25 – 2019-03-26 (×2): 6 via TOPICAL

## 2019-03-25 MED ORDER — VECURONIUM BROMIDE 10 MG IV SOLR
INTRAVENOUS | Status: AC
  Filled 2019-03-25: qty 10

## 2019-03-25 MED ORDER — MIDAZOLAM HCL 2 MG/2ML IJ SOLN
INTRAMUSCULAR | Status: AC
  Filled 2019-03-25: qty 4

## 2019-03-25 NOTE — Progress Notes (Addendum)
Debra Morgan  ZOX:[id number] DOB: [birth year] DOA: 03/16/2019 PCP: Karleen Hampshire., MD    Brief Narrative:  73 year old with a history of hypothyroidism, HTN, and a movement disorder who presented to the hospital with generalized weakness, fatigue, cough, shortness of breath, and chest pressure.  She tested positive for Covid 10/16 at which time she had a dry cough and also reported dyspnea with hallucinations.  She was hospitalized 10/22 > 10/26 and treated with Decadron.  Following her discharge home she reported rapid decline in her functional status with decreased mobility.  Upon returning to the ED her oxygen saturation was found to be 95% and chest x-ray showed no significant infiltrate.  CT chest did show mild bilateral interstitial infiltrates.  Significant Events: 10/22 > 10/26 hospitalized for Covid pneumonia at [facility] 10/28 readmitted for severe deconditioning 11/9 early AM - unresponsive episode - CTa head/neck unremarkable   COVID-19 specific Treatment: None this admission  Subjective: Profoundly depressed/withdrawn but conversant.  Denies abdominal pain but reports some shortness of breath.  Is tearful about not wanting to go to a nursing home.  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 Contributing to a very protracted recovery from Covid - followed by neurologist at [facility] -continue Cogentin  Hypothyroidism Continue Synthroid -TSH within normal limits  Sinus bradycardia Heart rate noted in the 40s during this hospital stay -Coreg discontinued during last hospitalization -nonischemic stress test 2 months prior to this admission  Severe depression with anxiety and suicidal ideation Continue BuSpar and Lexapro - was evaluated by Psychiatry 11/2 and not felt to be an imminent risk to herself  HTN  HLD Continue usual  statin dosing  UTI with urinary retention Continue antibiotic therapy with I/O cath as needed  DVT prophylaxis: Lovenox Code Status: DNR - NO CODE  Family Communication: Per PCCM Disposition Plan: Transfer to telemetry bed  Consultants:  none  Antimicrobials:  Keflex 11/3 >  Objective: Blood pressure 133/81, pulse 82, temperature 98.1 F (36.7 C), temperature source Axillary, resp. rate 18, height 5\' 4"  (1.626 m), weight 75.6 kg, SpO2 94 %.  Intake/Output Summary (Last 24 hours) at 03/25/2019 1413 Last data filed at 03/25/2019 1400 Gross per 24 hour  Intake 980 ml  Output 700 ml  Net 280 ml   Filed Weights   03/22/19 0311 03/23/19 0500 03/25/19 0500  Weight: 74.9 kg 74.2 kg 75.6 kg    Examination: General: No acute respiratory distress Lungs: Clear to auscultation bilaterally without wheezes or crackles Cardiovascular: Regular rate and rhythm without murmur gallop or rub normal S1 and S2 Abdomen: Nontender, nondistended, soft, bowel sounds positive, no rebound, no ascites, no appreciable mass Extremities: No significant cyanosis, clubbing, or edema bilateral lower extremities  CBC: Recent Labs  Lab 03/22/19 0845 03/25/19 0336 03/25/19 0340 03/25/19 1140  WBC 10.1  --  6.4  --   HGB 14.1 11.9* 13.4 11.9*  HCT 40.6 35.0* 39.0 35.0*  MCV 99.3  --  98.0  --   PLT 366  --  317  --    Basic Metabolic Panel: Recent Labs  Lab 03/20/19 0005 03/22/19 0845 03/23/19 0135 03/25/19 0336 03/25/19 0340 03/25/19 1140  NA 138 137  --  138 139 138  K 3.8 4.3  --  3.8 3.9 4.0  CL 101 102  --   --  104  --   CO2 25 27  --   --  23  --   GLUCOSE 85 134*  --   --  109*  --   BUN 38* 31*  --   --  31*  --   CREATININE 0.93 0.86 0.93  --  0.94  --   CALCIUM 9.4 8.9  --   --  9.4  --    GFR: Estimated Creatinine Clearance: 52.3 mL/min (by C-G formula based on SCr of 0.94 mg/dL).  Liver Function Tests: Recent Labs  Lab 03/19/19 0010 03/20/19 0005 03/22/19 0845  AST  29 22 30   ALT 52* 40 36  ALKPHOS 74 65 68  BILITOT 0.5 0.5 1.0  PROT 6.5 6.3* 6.6  ALBUMIN 3.1* 3.1* 3.3*    Cardiac Enzymes: Recent Labs  Lab 03/25/19 0340  CKTOTAL 24*  CKMB 1.3    CBG: Recent Labs  Lab 03/25/19 0306 03/25/19 0523 03/25/19 1206  GLUCAP 106* 108* 174*    Recent Results (from the past 240 hour(s))  Culture, Urine     Status: Abnormal   Collection Time: 03/22/19 10:45 AM   Specimen: Urine, Clean Catch  Result Value Ref Range Status   Specimen Description   Final    URINE, CLEAN CATCH Performed at [facility], [address], [place], [place]    Special Requests   Final    NONE Performed at [facility], [address] [place], [place]    Culture MULTIPLE SPECIES PRESENT, SUGGEST RECOLLECTION (A)  Final   Report Status 03/23/2019 FINAL  Final  MRSA PCR Screening     Status: None   Collection Time: 03/25/19  5:15 AM   Specimen: Nasal Mucosa; Nasopharyngeal  Result Value Ref Range Status   MRSA by PCR NEGATIVE NEGATIVE Final    Comment:        The GeneXpert MRSA Assay (FDA approved for NASAL specimens only), is one component of a comprehensive MRSA colonization surveillance program. It is not intended to diagnose MRSA infection nor to guide or monitor treatment for MRSA infections. Performed at [facility], [address] [place], [place]      Scheduled Meds: . benztropine  1 mg Oral BID  . busPIRone  7.5 mg Oral BID  . carvedilol  3.125 mg Oral BID WC  . cephALEXin  500 mg Oral Q12H  . Chlorhexidine Gluconate Cloth  6 each Topical Daily  . dexamethasone (DECADRON) injection  8 mg Intravenous Q24H  . enoxaparin (LOVENOX) injection  40 mg Subcutaneous Q24H  . escitalopram  10 mg Oral Daily  . etomidate      . ezetimibe  10 mg Oral Daily  . fentaNYL      . irbesartan  37.5 mg Oral Daily  . levothyroxine  75 mcg Oral Q0600  . midazolam      .  propofol      . rocuronium bromide      . sodium chloride flush  3 mL Intravenous Q12H  . sterile water (preservative free)      . succinylcholine      . vecuronium         LOS: 8 days   [id number], MD [facility] Office  [phone] Pager - Text Page per Amion  If 7PM-7AM, please contact night-coverage per Amion 03/25/2019, 2:13 PM

## 2019-03-25 NOTE — Progress Notes (Addendum)
Spoke to [place] for 40min. She is very concerned about the patient's condition and plan of care. She is concern about the patients sleepiness and all the medications she is on. She does not want me to give the patient's cogentin because she does not have Parkinson. Her neurologist never put her on Cogentin. And that the Dystonia does not need to be  treated. She has only been receiving physical therapy for that.  Also she would like to discontinue the Buspar, she feel like her mother  Is getting  too much medication. She was fine with just the lexipro.  She would like to talk to a Provider before giving her mother another dose of Remdesivir.  Do not administered another dose until she gives permission.   She is also concern that her mother has not eaten. She thinks that is a reason why her body is so tired.   She also wants to make sure her mother still has covid.  She would like me to cal her again around midnight for another update. I informed her that I would only if I have available time or if something changes.  The charge nurse and the QC were informed about the discussion and [name]'s concerns.

## 2019-03-25 NOTE — Progress Notes (Addendum)
Cc time 40 min  Rapid response called for ams.  lkn around 930pm.  Arrived to bedside pt not following commands with no focal neurological deficits but mae spontaneously.  Code stroke was called.  Ct tech called in for stat cth.  Spoke to dr Earnestine Leys pt out of tpa window and with no focal deficits not tpa candidate under current clinicaly scenerio.  afvss with 02 sats above 92% on 2 liters. Protecting own airway.  Move to icu, stat cmp, cbc, trop , lactic, dimer and crp ordered.  Appears pt has not gotten remdisivir or steroids yet due to lack of symptoms up until now with the exception of weakness and physical deconditioning.  Move to icu for closer monitoring.  Also discussed with dr Genevive Bi with pccm.  cxr ordered also which I suspect will be worse and 12 lead ekg.  Pt minimally follows commands, follows head to voice.  Cont to monitor.  Hold off on intubation at this time.  Called home phone spoke to husband with update at 530am.  imformed of worsening condition, discussed code status.  Discussed poor prognosis with her chronic wheelchair bound state and overall poor functional status prior to this illness if she were to need to be intubated.  Pt is full code at this time, but he will discuss with his daughter in the morning on if they would want to change that in the near future.  Discussed decadron and remdisivir and he is agreeable to trying these experimental medications for covid at this time.  He asked to come visit, advised only allowed if pt is comfort care and dying, which we are not at that point yet.

## 2019-03-25 NOTE — Progress Notes (Signed)
NAME:  Pietro CassisSusan Siverson, MRN:  [id number], DOB:  1946-01-30, LOS: 8 ADMISSION DATE:  03/16/2019, CONSULTATION DATE:  11/6 REFERRING MD:  Ella JubileeArrien, CHIEF COMPLAINT:  Confusion, change in mental status   Brief History   73 y/o female with dystonia admitted with COVID pneumonia in 02/2019 returned 10/28 after discharge with profound weakness.  Was set to be discharged to a SNF on 11/6 but early in the AM she became unresponsive.    History of present illness / CONSULT NOTE  73 y/o female with dystonia admitted with COVID pneumonia in 02/2019 returned 10/28 after discharge with profound weakness.  Was set to be discharged to a SNF on 11/6 but early in the AM she became unresponsive.   A code stroke was called.  CT angiogram of head/brain normal.  She was awake and alert on my exam, conversant and profoundly depressed.  She noted that she didn't want to be intubated, no CPR.    Family says she has [facility]  She has had trouble with inability to move her feet periodically over the years and has a strong fear of falling.  She seen multiple neurologists on various forms of treatment for movement disorder including Botox injections, Sinemet, and other therapies.  She has never had periods of catatonia by report from her husband and daughter.  Past Medical History  Thyroid disease Hypertension HLD Dystonia  Significant Hospital Events   10/28 admission 11/6 move to ICU, unresponsive, CODE STROKE, resolved spontaneously  Consults:  PCCM Psych  Procedures:    Significant Diagnostic Tests:    Micro Data:  11/3 Urine > multiple species  Antimicrobials:  11/3 cefalexin 11/3 remdesivir   Interim history/subjective:    Objective   Blood pressure 126/84, pulse 64, temperature (!) 96.9 F (36.1 C), temperature source Axillary, resp. rate 19, height 5\' 4"  (1.626 m), weight 75.6 kg, SpO2 100 %.        Intake/Output Summary (Last 24 hours) at 03/25/2019  0802 Last data filed at 03/25/2019 0700 Gross per 24 hour  Intake 380 ml  Output 200 ml  Net 180 ml   Filed Weights   03/22/19 0311 03/23/19 0500 03/25/19 0500  Weight: 74.9 kg 74.2 kg 75.6 kg    Examination: General:  Resting comfortably in bed HENT: NCAT OP clear PULM: CTA B, normal effort CV: RRR, no mgr GI: BS+, soft, nontender MSK: normal bulk and tone Neuro: awake, alert, no distress, MAEW   Resolved Hospital Problem list     Assessment & Plan:  Unresponsiveness this morning, sudden acute encephalopathy: Now resolved Etiology here is uncertain.  It is not clear to me if this is related to her profound depression or somehow related to her underlying movement disorder.  There is not a clear evidence of an underlying metabolic derangement, her vital signs are normal, CT angiogram and code stroke called overnight was normal, telemetry shows no evidence of an underlying cardiac problem.  I wonder if this is related to her underlying depression or movement disorder.  Okay to leave ICU Continue neurochecks per floor protocol Consider neurology consult  Code status: DNR  Best practice:   Code Status: DNR per patient wishes, family undertstands Family Communication: I updated her husband and daughter Disposition:   Labs   CBC: Recent Labs  Lab 03/22/19 0845 03/25/19 0336 03/25/19 0340  WBC 10.1  --  6.4  HGB 14.1 11.9* 13.4  HCT 40.6 35.0* 39.0  MCV 99.3  --  98.0  PLT 366  --  317    Basic Metabolic Panel: Recent Labs  Lab 03/19/19 0010 03/20/19 0005 03/22/19 0845 03/23/19 0135 03/25/19 0336 03/25/19 0340  NA 136 138 137  --  138 139  K 3.9 3.8 4.3  --  3.8 3.9  CL 99 101 102  --   --  104  CO2 26 25 27   --   --  23  GLUCOSE 106* 85 134*  --   --  109*  BUN 46* 38* 31*  --   --  31*  CREATININE 1.02* 0.93 0.86 0.93  --  0.94  CALCIUM 9.7 9.4 8.9  --   --  9.4   GFR: Estimated Creatinine Clearance: 52.3 mL/min (by C-G formula based on SCr of 0.94  mg/dL). Recent Labs  Lab 03/22/19 0845 03/25/19 0340  WBC 10.1 6.4    Liver Function Tests: Recent Labs  Lab 03/19/19 0010 03/20/19 0005 03/22/19 0845  AST 29 22 30   ALT 52* 40 36  ALKPHOS 74 65 68  BILITOT 0.5 0.5 1.0  PROT 6.5 6.3* 6.6  ALBUMIN 3.1* 3.1* 3.3*   No results for input(s): LIPASE, AMYLASE in the last 168 hours. No results for input(s): AMMONIA in the last 168 hours.  ABG    Component Value Date/Time   PHART 7.492 (H) 03/25/2019 0336   PCO2ART 31.0 (L) 03/25/2019 0336   PO2ART 64.0 (L) 03/25/2019 0336   HCO3 23.8 03/25/2019 0336   TCO2 25 03/25/2019 0336   O2SAT 94.0 03/25/2019 0336     Coagulation Profile: No results for input(s): INR, PROTIME in the last 168 hours.  Cardiac Enzymes: No results for input(s): CKTOTAL, CKMB, CKMBINDEX, TROPONINI in the last 168 hours.  HbA1C: No results found for: HGBA1C  CBG: Recent Labs  Lab 03/25/19 0306 03/25/19 0523  GLUCAP 106* 108*    Review of Systems:   Gen: Denies fever, chills, weight change, fatigue, night sweats HEENT: Denies blurred vision, double vision, hearing loss, tinnitus, sinus congestion, rhinorrhea, sore throat, neck stiffness, dysphagia PULM: Denies shortness of breath, cough, sputum production, hemoptysis, wheezing CV: Denies chest pain, edema, orthopnea, paroxysmal nocturnal dyspnea, palpitations GI: Denies abdominal pain, nausea, vomiting, diarrhea, hematochezia, melena, constipation, change in bowel habits GU: Denies dysuria, hematuria, polyuria, oliguria, urethral discharge Endocrine: Denies hot or cold intolerance, polyuria, polyphagia or appetite change Derm: Denies rash, dry skin, scaling or peeling skin change Heme: Denies easy bruising, bleeding, bleeding gums Neuro: Denies headache, numbness, weakness, slurred speech, loss of memory or consciousness   Past Medical History  She,  has a past medical history of Dystonia, High cholesterol, Hypertension, and Thyroid disease.    Surgical History    Past Surgical History:  Procedure Laterality Date  . TUBAL LIGATION       Social History   reports that she has never smoked. She has never used smokeless tobacco. She reports current alcohol use. She reports that she does not use drugs.   Family History   Her family history is not on file.   Allergies Allergies  Allergen Reactions  . Sinemet [Carbidopa W-Levodopa] Other (See Comments)    Feels "jittery"  . Codeine Palpitations  . Tetracyclines & Related Rash     Home Medications  Prior to Admission medications   Medication Sig Start Date End Date Taking? Authorizing Provider  candesartan (ATACAND) 4 MG tablet Take 4 mg by mouth daily. 08/26/18  Yes [provider]  dexamethasone (DECADRON) 6 MG tablet  Take 6 mg by mouth daily. 03/13/19  Yes [provider]  levothyroxine (SYNTHROID) 75 MCG tablet Take 75 mcg by mouth daily. 08/10/18  Yes [provider]  triamterene-hydrochlorothiazide (MAXZIDE-25) 37.5-25 MG tablet Take 1 tablet by mouth daily. 09/13/18  Yes [provider]  busPIRone (BUSPAR) 7.5 MG tablet Take 1 tablet (7.5 mg total) by mouth 2 (two) times daily. 03/24/19 04/23/19  Arrien, Jimmy Picket, MD  escitalopram (LEXAPRO) 10 MG tablet Take 1 tablet (10 mg total) by mouth daily. 03/24/19 04/23/19  Arrien, Jimmy Picket, MD  ezetimibe (ZETIA) 10 MG tablet Take 1 tablet (10 mg total) by mouth daily. 03/24/19 04/23/19  Arrien, Jimmy Picket, MD     Critical care time: n/a     Roselie Awkward, MD [facility] Pager: [phone] Cell: [phone] If no response, call [phone]

## 2019-03-25 NOTE — Progress Notes (Addendum)
0730: Assumed care of Pt from night RN. Pt still groggy, but awakening and initiating conversation.  0745: Pt assessed, wakes easily from sleep, oriented, denies pain. Says she can't feel her feet and legs, but with eyes closed responded to painful touch on both feet. VS WNL, SpO2 95% on room air. Pt says she can't catch her breath, but does not appear to be in distress, RR 16. Pt states she wants to go home, not rehab. Denies any other needs at this time. Will monitor  0830: Husband called unit, RN updated over phone.   0900: Pt sat on edge of bed independently to eat breakfast. Reminded not to get OOB without assistance. Will monitor  1000: Daughter spoke with Pt on cell phone. Daughter then called unit twice, updated on Pt progress again today. Daughter stated all questions answered.  1130: Pt bradycardic to 30's nonsustained. Attempted to wake Pt with difficulty. After sternal rub, Pt would open eyes and make eye contact,but would fall back asleep immediately. SpO2 96% on room air, RR 20, HR 71. MDs made aware, ABG ordered.   1400: Pt awake now and talking with family on phone. States hungry. Now Pt more awake, appropriate, and safe to eat, lunch tray given. Will monitor.   1500: Pt told she is being transferred to PCU. Pt said, "I am not ready" and went to sleep. Pt is arousable, but will not make eye contact or answer my questions. VSS  1715: Report given and Pt transferred to PCU without complications. All belongings at bedside, phone in Pt's lap.

## 2019-03-25 NOTE — Progress Notes (Signed)
approx 12am - pt called out wanting to go to the bathroom, reminded pt she had the purewick and she verbalized understanding that would collect her urine, I ran the sink water as she said she could not pee and then stated she did end up going.  At 2:00am pt appeared asleep no concerns noted  3:00am - rounding on pt , pt was moaning turning head side to side on her pillow shaking her hands. Unable to get pt to respond to me verbally or focus on me. Delayed response from painful stimuli to nailbeds, sternal rub created little to no response. CBG checked & normal, VSS, O2 on RA = 96%. Notified charge nurse re: change in pts mental status and Rapid response was initiated. Amion text paig to Dr Shanon Brow re; status change. No response initially so a 2nd page was sent with urgent request to call ASAP. She called back - pt status was given & orders obtained.  Approx 0405am pt was taken for CT head with Rapid response team on ttelemetry & O2. 0410- this RN called and spoke to pts daughter to update her on pts change in condition.

## 2019-03-25 NOTE — Progress Notes (Signed)
Pharmacy Note - Remdesivir Dosing  O:  ALT:  30 (11/3) CXR:  Patchy bilateral infiltrate correlating with history of COVID-19 positivity Requiring supplemental O2: on 3L per [place]   A/P:  Patient meets criteria for remdesivir.  Begin remdesivir 200 mg IV x 1, followed by 100 mg IV daily x 4 days  Monitor ALT, clinical progress  Despina Pole, Pharm. D. Clinical Pharmacist 03/25/2019 5:44 AM

## 2019-03-25 NOTE — Progress Notes (Signed)
Purple DNR band applied to patient right wrist per patient wishes and Dr order.

## 2019-03-25 NOTE — Progress Notes (Signed)
Called pt's daughter, updated on negative CT scan, discussed pt staying in ICU while we check labwork to r/o other causes of the AMS. Updated that pt is a little more alert, but isn't saying words other than "I don't know", though she is nodding and shaking her head appropriately to questions. Updated that MD has ordered remdesevir and decadron, explained purpose of those drugs. Daughter is concerned about the meds pt is taking that were started during this hospitalization (cogentin, buspar). She is concerned that these newer meds may be causing the AMS. I said I would check with our pharmacist to see if those meds could cause these symptoms.  Daughter to update husband, who is understandably shaken after previous update about pt's initial AMS. I told her that she can call our unit anytime and that the day team will call to update them later today. Also told her we could set up a video visit when pt is more alert. Pt's daughter is grateful for our care.

## 2019-03-26 DIAGNOSIS — F331 Major depressive disorder, recurrent, moderate: Secondary | ICD-10-CM

## 2019-03-26 MED ORDER — CARVEDILOL 6.25 MG PO TABS: 6.2500 mg | ORAL_TABLET | Freq: Two times a day (BID) | ORAL | 0 refills | Status: DC

## 2019-03-26 MED ORDER — ESCITALOPRAM OXALATE 10 MG PO TABS: 5.0000 mg | ORAL_TABLET | Freq: Every day | ORAL | Status: DC

## 2019-03-26 MED ORDER — ESCITALOPRAM OXALATE 5 MG PO TABS: 5.0000 mg | ORAL_TABLET | Freq: Every day | ORAL | 0 refills | Status: DC

## 2019-03-26 NOTE — Plan of Care (Signed)
Pt was more alert this shift. She woke up and eat dinner. She spoke to her daughter [name] on the phone around 2230. She answered all orientation questions, and followed directions. She stated she could not walk, so an external catheter was place for urination. Overall she slept well through the night.   Problem: Education: Goal: Knowledge of risk factors and measures for prevention of condition will improve Outcome: Progressing   Problem: Coping: Goal: Psychosocial and spiritual needs will be supported Outcome: Progressing   Problem: Respiratory: Goal: Will maintain a patent airway Outcome: Progressing Goal: Complications related to the disease process, condition or treatment will be avoided or minimized Outcome: Progressing   Problem: Education: Goal: Knowledge of General Education information will improve Description: Including pain rating scale, medication(s)/side effects and non-pharmacologic comfort measures Outcome: Progressing   Problem: Health Behavior/Discharge Planning: Goal: Ability to manage health-related needs will improve Outcome: Progressing   Problem: Clinical Measurements: Goal: Ability to maintain clinical measurements within normal limits will improve Outcome: Progressing Goal: Will remain free from infection Outcome: Progressing Goal: Diagnostic test results will improve Outcome: Progressing Goal: Respiratory complications will improve Outcome: Progressing Goal: Cardiovascular complication will be avoided Outcome: Progressing   Problem: Activity: Goal: Risk for activity intolerance will decrease Outcome: Progressing   Problem: Nutrition: Goal: Adequate nutrition will be maintained Outcome: Progressing   Problem: Elimination: Goal: Will not experience complications related to bowel motility Outcome: Progressing Goal: Will not experience complications related to urinary retention Outcome: Progressing   Problem: Coping: Goal: Level of anxiety will  decrease Outcome: Progressing   Problem: Pain Managment: Goal: General experience of comfort will improve Outcome: Progressing   Problem: Safety: Goal: Ability to remain free from injury will improve Outcome: Progressing   Problem: Skin Integrity: Goal: Risk for impaired skin integrity will decrease Outcome: Progressing

## 2019-03-26 NOTE — Discharge Instructions (Signed)

## 2019-03-26 NOTE — TOC Transition Note (Signed)
Transition of Care [facility]) - CM/SW Discharge Note   Patient Details  Name: Debra Morgan MRN: [id number] Date of Birth: [birth year]  [facility] of [facility] CM/SW Contact:  Ninfa Meeker, RN Phone Number: 03/26/2019, 1:28 PM   Clinical Narrative:  Thankfully patient has improved enough to discharge home. Case manager contacted her husband, Timmothy Sours, to discuss home health needs. Choice for Home Health agency was offered. Referral was called to Adela Lank, [facility] Liaison. Patient has DME at the home already. Mr. Fiscus says that he is getting other persons in to provide assistance for his wife. He will pick her up when she is ready for discharge.     Final next level of care: Home w Home Health Services Barriers to Discharge: No Barriers Identified   Patient Goals and CMS Choice Patient states their goals for this hospitalization and ongoing recovery are:: per husband he wants her to get better CMS Medicare.gov Compare Post Acute Care list provided to:: Patient Represenative (must comment)(husband: Carleene Overlie) Choice offered to / list presented to : Spouse  Discharge Placement                       Discharge Plan and Services In-house Referral: NA Discharge Planning Services: CM Consult Post Acute Care Choice: Home Health                    HH Arranged: RN, PT, OT, Nurse's Aide [employer] [employer] Agency: [place] Date [place]: 03/26/19 Time [place]: 1327 Representative spoke with at [place]: Adela Lank  Social Determinants of Health (SDOH) Interventions     Readmission Risk Interventions No flowsheet data found.

## 2019-03-26 NOTE — Progress Notes (Signed)
spoke to her daughter [name] this morning. She is happy about her mother's state of mind

## 2019-03-26 NOTE — Discharge Summary (Signed)
Physician Discharge Summary  Debra Morgan ZOX:[id number] DOB: 08-19-1945 DOA: 03/16/2019  PCP: Laqueta Due., MD  Admit date: 03/16/2019 Discharge date: 03/26/2019  Admitted From: Home  Disposition:  Home   Recommendations for Outpatient Follow-up and new medication changes:   1. Follow up with Dr. Kristen Loader in 2 weeks.  2. Continue quarantine for 2 weeks, maintain physical distancing and use a mask in public.  3. Started on buspirone and escitalpram for depression/ anxiety. 4. Continue with ezetimibe for dyslipidemia.    Home Health: yes  Equipment/Devices: no    Discharge Condition: stable  CODE STATUS: full  Diet recommendation: heart healthy   Brief/Interim Summary: 73 year old female who presented with generalized weakness, fatigue, cough, dyspnea and chest pressure. She does have significant past medical history for hypothyroidism, hypertension and movement disorder/ dystonia.Patient tested positive for COVID-19 03/04/2019, she was noted to have persistent dry cough, whichwas complicated by hallucinations and dyspnea. She was hospitalized 10/22, she was treated with dexamethasone and discharged 03/14/2019. At home she continued to have rapid decline in her functional status, worsening mobility despite she chronically wheelchair-bound. On her initial physical examination blood pressure 130/76, heart rate 49, respiratory rate 14, temperature 98.1, oxygen saturation 95%, her lungs had no wheezing, rales or rhonchi, heart S1-S2 present, rhythmic, abdomen soft, lower extremities with trace edema. Generalized weakness. Sodium 137, potassium 4.3, chloride 103, bicarb 22, glucose 123, BUN 34, creatinine 0.98, white count 7.9, hemoglobin 15.0, hematocrit 43.1, platelets 351.Her chest x-ray had no significant infiltrates. Chest CT with bilateral interstitial infiltrates.  EKG with 53 bpm, normal axis, normal intervals, sinus rhythm, no ST segment or T wave changes.  Patient was admitted  with the working diagnosis of SARS COVID-19 viral pneumonia.  Patient continue to have deconditioning and severe generalized weakness.  Patient was seen by physical therapy, with recommendations to follow with physical therapy at a skilled nursing facility for rehabilitation.  Patient had acute change in mentation, early morning November 6, that required her to be transferred to the intensive care unit for close neuro checks, her work-up for CVA was negative, likely change in mentation psychogenic in nature.  Her symptoms remarkably improved and she was transferred back to the medical ward  1.  SARS COVID-19 viral pneumonia (ruled out).  Despite having infiltrates on chest imaging she had no significant respiratory symptoms, her initial diagnosis of COVID-19 was about 2 weeks prior to hospitalization, she was treated with systemic corticosteroids.  Her inflammatory markers were not significantly elevated.  She was kept on respiratory isolation.  Did not qualify for specific treatment with antivirals or continue systemic corticosteroids.   Transitory elevation of liver enzymes. Pulmonary edema suggested by imaging, but clinically this condition was ruled out. Patient did not receive diuretics.   Discharge oxymetry is 95 to 99% on room air.   2.  Generalized deconditioning in the setting of chronic dystonia, peripheral neuropathy and complicated by metabolic encephalopathy.  Patient had rapid decline in her physical functional status, likely as a consequence of recent SARS COVID-19 viral infection.  Patient received supportive medical therapy, along with physical therapy and psychiatric consultation.  Patient had suicidal ideation, she was evaluated by psychiatry, recommended escitalopram and and BuSpar.  B12 levels within normal ranges.   Patient had acute change in mentation, with negative work-up for acute CVA including head CT and CT angiography, it is suspected that the event was  psychogenic in origin, BuSpar was discontinued, patient will continue escitalopram 5 mg daily.   3.  Hypertension and dyslipidemia.  Continue blood pressure control with carvedilol, triamterene and hctz. Ezetimibe for dyslipidemia.    4. Urinary tract infection and urinary retention/ present on admission.  Patient had urinary retention that required Foley catheter placement, patient received antibiotic therapy with cephalosporins with good toleration.  At discharge patient passed a voiding trial.  Her urinary tract infection was not catheter related.  5. Hypothyroid. Continue with levothyroxine.     Discharge Diagnoses:  Principal Problem:   Generalized weakness Active Problems:   Thyroid disease   Dystonia   Pneumonia due to COVID-19 virus   Elevated transaminase level   Hypertension   Chest pain   Major depressive disorder, recurrent episode, moderate ([place])    Discharge Instructions  Discharge Instructions    MyChart COVID-19 home monitoring program   Complete by: Mar 24, 2019    Is the patient willing to use the [facility] for home monitoring?: Yes   Temperature monitoring   Complete by: Mar 24, 2019    After how many days would you like to receive a notification of this patient's flowsheet entries?: 1   Diet - low sodium heart healthy   Complete by: As directed    Discharge instructions   Complete by: As directed    Please continue quarantine for 2 weeks, use a mask in public and maintain physical distancing.   Increase activity slowly   Complete by: As directed      Allergies as of 03/26/2019      Reactions   Sinemet [carbidopa W-levodopa] Other (See Comments)   Feels "jittery"   Codeine Palpitations   Tetracyclines & Related Rash      Medication List    STOP taking these medications   dexamethasone 6 MG tablet Commonly known as: DECADRON     TAKE these medications   candesartan 4 MG tablet Commonly known as: ATACAND Take 4 mg by mouth  daily.   escitalopram 5 MG tablet Commonly known as: LEXAPRO Take 1 tablet (5 mg total) by mouth daily. Start taking on: March 27, 2019   ezetimibe 10 MG tablet Commonly known as: ZETIA Take 1 tablet (10 mg total) by mouth daily.   levothyroxine 75 MCG tablet Commonly known as: SYNTHROID Take 75 mcg by mouth daily.   triamterene-hydrochlorothiazide 37.5-25 MG tablet Commonly known as: MAXZIDE-25 Take 1 tablet by mouth daily.       Allergies  Allergen Reactions  . Sinemet [Carbidopa W-Levodopa] Other (See Comments)    Feels "jittery"  . Codeine Palpitations  . Tetracyclines & Related Rash    Consultations:  Neurology over the phone.    Procedures/Studies: Ct Code Stroke Cta Head W/wo Contrast  Result Date: 03/25/2019 CLINICAL DATA:  Altered mental status.  Hemorrhage suspected EXAM: CT ANGIOGRAPHY HEAD AND NECK TECHNIQUE: Multidetector CT imaging of the head and neck was performed using the standard protocol during bolus administration of intravenous contrast. Multiplanar CT image reconstructions and MIPs were obtained to evaluate the vascular anatomy. Carotid stenosis measurements (when applicable) are obtained utilizing NASCET criteria, using the distal internal carotid diameter as the denominator. CONTRAST:  66mL OMNIPAQUE IOHEXOL 350 MG/ML SOLN COMPARISON:  Head CT from earlier today FINDINGS: CTA NECK FINDINGS Aortic arch: Atherosclerotic plaque.  Three vessel branching. Right carotid system: Tortuous ICA. Vessels are smooth and widely patent. No atheromatous changes. Left carotid system: Atherosclerotic calcification about the bifurcation that is mild for age. No stenosis or ulceration. Vertebral arteries: No brachiocephalic or proximal subclavian flow limiting stenosis.  Strong dominance of the right vertebral artery which is smooth and widely patent to the dura. A diminutive left vertebral artery is also patent to the dura although most flow is into the left occipital  soft tissues. Skeleton: No acute or aggressive finding Other neck: Negative Upper chest: Patchy airspace disease in the upper lungs correlating with history of COVID-19 positivity Review of the MIP images confirms the above findings CTA HEAD FINDINGS Anterior circulation: Vessels are smooth and widely patent. No branch occlusion, beading, or aneurysm. Posterior circulation: Strong right vertebral artery dominance. A tiny left vertebral artery does reach the basilar. Aplastic right and hypoplastic left P1 segment. No branch occlusion, beading, or aneurysm Venous sinuses: Patent Anatomic variants: As above Review of the MIP images confirms the above findings IMPRESSION: 1. No emergent vascular finding. 2. Biapical airspace disease correlating with history of COVID-19 positivity. 3. Mild for age atherosclerosis. Electronically Signed   By: Marnee Spring M.D.   On: 03/25/2019 05:15   Ct Code Stroke Cta Neck W/wo Contrast  Result Date: 03/25/2019 CLINICAL DATA:  Altered mental status.  Hemorrhage suspected EXAM: CT ANGIOGRAPHY HEAD AND NECK TECHNIQUE: Multidetector CT imaging of the head and neck was performed using the standard protocol during bolus administration of intravenous contrast. Multiplanar CT image reconstructions and MIPs were obtained to evaluate the vascular anatomy. Carotid stenosis measurements (when applicable) are obtained utilizing NASCET criteria, using the distal internal carotid diameter as the denominator. CONTRAST:  80mL OMNIPAQUE IOHEXOL 350 MG/ML SOLN COMPARISON:  Head CT from earlier today FINDINGS: CTA NECK FINDINGS Aortic arch: Atherosclerotic plaque.  Three vessel branching. Right carotid system: Tortuous ICA. Vessels are smooth and widely patent. No atheromatous changes. Left carotid system: Atherosclerotic calcification about the bifurcation that is mild for age. No stenosis or ulceration. Vertebral arteries: No brachiocephalic or proximal subclavian flow limiting stenosis. Strong  dominance of the right vertebral artery which is smooth and widely patent to the dura. A diminutive left vertebral artery is also patent to the dura although most flow is into the left occipital soft tissues. Skeleton: No acute or aggressive finding Other neck: Negative Upper chest: Patchy airspace disease in the upper lungs correlating with history of COVID-19 positivity Review of the MIP images confirms the above findings CTA HEAD FINDINGS Anterior circulation: Vessels are smooth and widely patent. No branch occlusion, beading, or aneurysm. Posterior circulation: Strong right vertebral artery dominance. A tiny left vertebral artery does reach the basilar. Aplastic right and hypoplastic left P1 segment. No branch occlusion, beading, or aneurysm Venous sinuses: Patent Anatomic variants: As above Review of the MIP images confirms the above findings IMPRESSION: 1. No emergent vascular finding. 2. Biapical airspace disease correlating with history of COVID-19 positivity. 3. Mild for age atherosclerosis. Electronically Signed   By: Marnee Spring M.D.   On: 03/25/2019 05:15   Ct Angio Chest Pe W And/or Wo Contrast  Result Date: 03/16/2019 CLINICAL DATA:  Shortness of breath, COVID-19 positive. EXAM: CT ANGIOGRAPHY CHEST WITH CONTRAST TECHNIQUE: Multidetector CT imaging of the chest was performed using the standard protocol during bolus administration of intravenous contrast. Multiplanar CT image reconstructions and MIPs were obtained to evaluate the vascular anatomy. CONTRAST:  75mL OMNIPAQUE IOHEXOL 350 MG/ML SOLN COMPARISON:  CTA November 04, 2018 FINDINGS: Cardiovascular: Satisfactory opacification of the pulmonary arteries to the lobar level. More distal evaluation limited by respiratory motion and underlying parenchymal disease. No pulmonary artery filling defects are seen to the lobar level. Pulmonary arterial caliber is upper limits normal,  unchanged from comparison. Atherosclerotic plaque of the nonaneurysmal  thoracic aorta. Normal 3 vessel branching of the arch with scant calcification in the proximal great vessels. Mild cardiomegaly with biatrial enlargement. No pericardial effusion. No elevation of the RV/LV ratio (0.75). Mediastinum/Nodes: No enlarged mediastinal, hilar or axillary lymph nodes. Thyroid gland, trachea, and esophagus demonstrate no significant findings. Lungs/Pleura: Multifocal areas peripheral predominant ground-glass are present in the lungs. No pneumothorax or effusion. Mild septal thickening noted in the apices and bases. Diffuse airways thickening and scattered secretions. Upper Abdomen: Non rotation of both kidneys, anatomic variant. Partial fatty replacement of the pancreas. Slight reflux of contrast into the hepatic veins. Musculoskeletal: Multilevel degenerative changes are present in the imaged portions of the spine. No acute osseous abnormality or suspicious osseous lesion. No concerning chest wall lesions S-shaped curvature of the thoracolumbar spine vertebroplasty changes at L1, partially visualized. Review of the MIP images confirms the above findings. IMPRESSION: 1. No evidence of pulmonary embolism to the lobar level. More distal evaluation limited by respiratory motion and underlying parenchymal disease. 2. Multifocal areas of peripheral predominant ground-glass in the lungs, compatible with atypical infection in the setting of known COVID-19 positivity. 3. Some septal thickening in the apices may reflect superimposed edema. 4. Aortic Atherosclerosis (ICD10-I70.0). Electronically Signed   By: Kreg Shropshire M.D.   On: 03/16/2019 21:15   Dg Chest Port 1 View  Result Date: 03/25/2019 CLINICAL DATA:  Altered mental status EXAM: PORTABLE CHEST 1 VIEW COMPARISON:  01/14/2019 FINDINGS: Lower volumes and increased patchy bilateral pulmonary opacity. Cardiomegaly and vascular pedicle widening accentuated by rotation and lower volumes. No visible effusion or pneumothorax IMPRESSION: Patchy  bilateral infiltrate correlating with history of COVID-19 positivity. Opacity and inflation has worsened since 01/14/2019. Electronically Signed   By: Marnee Spring M.D.   On: 03/25/2019 05:36   Dg Chest Portable 1 View  Result Date: 03/16/2019 CLINICAL DATA:  Initial evaluation for acute cough, weakness, OB positive. EXAM: PORTABLE CHEST 1 VIEW COMPARISON:  Prior radiograph from 11/11/2018. FINDINGS: Cardiac and mediastinal silhouettes within normal limits. Lungs mildly hypoinflated. Linear atelectatic changes noted within the right perihilar region, lingula, and left lung base. No other focal airspace disease. No edema or effusion. No pneumothorax. No acute osseous finding. IMPRESSION: 1. Mild scattered subsegmental atelectasis within the right perihilar region and left lung base. 2. No other radiographic evidence for active cardiopulmonary disease. Electronically Signed   By: Rise Mu M.D.   On: 03/16/2019 19:15   Ct Head Code Stroke Wo Contrast  Result Date: 03/25/2019 CLINICAL DATA:  Code stroke. Altered mental status with unclear cause EXAM: CT HEAD WITHOUT CONTRAST TECHNIQUE: Contiguous axial images were obtained from the base of the skull through the vertex without intravenous contrast. COMPARISON:  03/10/2019 FINDINGS: Brain: No evidence of acute infarction, hemorrhage, hydrocephalus, extra-axial collection or mass lesion/mass effect. Mild patchy low-density in the cerebral white matter attributed to chronic small vessel ischemia. Central predominant cerebral volume loss. Vascular: No hyperdense vessel or unexpected calcification. Skull: Normal. Negative for fracture or focal lesion. Sinuses/Orbits: Negative Other: Page was placed 03/25/2019 at 4:45 am to provider [facility] DAVID . ASPECTS [facility] Stroke Program Early CT Score) Not scored without localizing symptoms IMPRESSION: 1. No acute finding. 2. Mild chronic small vessel disease. Electronically Signed   By: Marnee Spring M.D.    On: 03/25/2019 04:47   Vas [place] Lower Extremity Venous (dvt)  Result Date: 03/20/2019  Lower Venous Study Indications: Elevated Ddimer.  Risk Factors: COVID 19 positive.  Limitations: Poor ultrasound/tissue interface. Comparison Study: No prior studies. Performing Technologist: Chanda Busing RVT  Examination Guidelines: A complete evaluation includes B-mode imaging, spectral Doppler, color Doppler, and power Doppler as needed of all accessible portions of each vessel. Bilateral testing is considered an integral part of a complete examination. Limited examinations for reoccurring indications may be performed as noted.  +---------+---------------+---------+-----------+----------+--------------+ RIGHT    CompressibilityPhasicitySpontaneityPropertiesThrombus Aging +---------+---------------+---------+-----------+----------+--------------+ CFV      Full           Yes      Yes                                 +---------+---------------+---------+-----------+----------+--------------+ SFJ      Full                                                        +---------+---------------+---------+-----------+----------+--------------+ FV Prox  Full                                                        +---------+---------------+---------+-----------+----------+--------------+ FV Mid   Full                                                        +---------+---------------+---------+-----------+----------+--------------+ FV DistalFull                                                        +---------+---------------+---------+-----------+----------+--------------+ PFV      Full                                                        +---------+---------------+---------+-----------+----------+--------------+ POP      Full           Yes      Yes                                 +---------+---------------+---------+-----------+----------+--------------+ PTV      Full                                                         +---------+---------------+---------+-----------+----------+--------------+ PERO     Full                                                        +---------+---------------+---------+-----------+----------+--------------+   +---------+---------------+---------+-----------+----------+--------------+  LEFT     CompressibilityPhasicitySpontaneityPropertiesThrombus Aging +---------+---------------+---------+-----------+----------+--------------+ CFV      Full           Yes      Yes                                 +---------+---------------+---------+-----------+----------+--------------+ SFJ      Full                                                        +---------+---------------+---------+-----------+----------+--------------+ FV Prox  Full                                                        +---------+---------------+---------+-----------+----------+--------------+ FV Mid   Full                                                        +---------+---------------+---------+-----------+----------+--------------+ FV DistalFull                                                        +---------+---------------+---------+-----------+----------+--------------+ PFV      Full                                                        +---------+---------------+---------+-----------+----------+--------------+ POP      Full           Yes      Yes                                 +---------+---------------+---------+-----------+----------+--------------+ PTV      Full                                                        +---------+---------------+---------+-----------+----------+--------------+ PERO     Full                                                        +---------+---------------+---------+-----------+----------+--------------+     Summary: Right: There is no evidence of deep vein thrombosis in the  lower extremity. No cystic structure found in the popliteal fossa. Left: There is no evidence of deep vein thrombosis in the lower extremity. No cystic structure found in the popliteal fossa.  *  See table(s) above for measurements and observations. Electronically signed by Waverly Ferrarihristopher Dickson MD on 03/20/2019 at 11:30:52 AM.    Final       Procedures:   Subjective: Patient is feeling better, no altered mentation, no nausea or vomiting, continue to be weak but, willing to go home. Declines SNF at this point in time, since she is feeling better.   Discharge Exam: Vitals:   03/26/19 0745 03/26/19 1159  BP: 137/70 131/73  Pulse: 62 71  Resp: 18 18  Temp: 97.7 F (36.5 C) 98.6 F (37 C)  SpO2: 96% 98%   Vitals:   03/25/19 2000 03/26/19 0000 03/26/19 0745 03/26/19 1159  BP: 122/70 122/77 137/70 131/73  Pulse: (!) 102 78 62 71  Resp: 18  18 18   Temp: 98.3 F (36.8 C) 97.9 F (36.6 C) 97.7 F (36.5 C) 98.6 F (37 C)  TempSrc: Oral Oral Oral Oral  SpO2: 93% 94% 96% 98%  Weight:      Height:        General: Not in pain or dyspnea, deconditioned  Neurology: Awake and alert, non focal  E ENT: mild pallor, no icterus, oral mucosa moist Cardiovascular: No JVD. S1-S2 present, rhythmic, no gallops, rubs, or murmurs. No lower extremity edema. Pulmonary: positive breath sounds bilaterally. Gastrointestinal. Abdomen with no organomegaly, non tender, no rebound or guarding Skin. No rashes Musculoskeletal: no joint deformities   The results of significant diagnostics from this hospitalization (including imaging, microbiology, ancillary and laboratory) are listed below for reference.     Microbiology: Recent Results (from the past 240 hour(s))  Culture, Urine     Status: Abnormal   Collection Time: 03/22/19 10:45 AM   Specimen: Urine, Clean Catch  Result Value Ref Range Status   Specimen Description   Final    URINE, CLEAN CATCH Performed at [facility], [address] [place], [place] [address]    Special Requests   Final    NONE Performed at [facility], [address] [place], [place] [address]    Culture MULTIPLE SPECIES PRESENT, SUGGEST RECOLLECTION (A)  Final   Report Status 03/23/2019 FINAL  Final  MRSA PCR Screening     Status: None   Collection Time: 03/25/19  5:15 AM   Specimen: Nasal Mucosa; Nasopharyngeal  Result Value Ref Range Status   MRSA by PCR NEGATIVE NEGATIVE Final    Comment:        The GeneXpert MRSA Assay (FDA approved for NASAL specimens only), is one component of a comprehensive MRSA colonization surveillance program. It is not intended to diagnose MRSA infection nor to guide or monitor treatment for MRSA infections. Performed at [facility], [address] [place], [place] [address]      Labs: BNP (last 3 results) Recent Labs    11/11/18 0926 03/17/19 0450  BNP 26.6 119.6*   Basic Metabolic Panel: Recent Labs  Lab 03/20/19 0005 03/22/19 0845 03/23/19 0135 03/25/19 0336 03/25/19 0340 03/25/19 1140  NA 138 137  --  138 139 138  K 3.8 4.3  --  3.8 3.9 4.0  CL 101 102  --   --  104  --   CO2 25 27  --   --  23  --   GLUCOSE 85 134*  --   --  109*  --   BUN 38* 31*  --   --  31*  --   CREATININE 0.93 0.86 0.93  --  0.94  --   CALCIUM 9.4 8.9  --   --  9.4  --    Liver Function Tests: Recent Labs  Lab 03/20/19 0005 03/22/19 0845  AST 22 30  ALT 40 36  ALKPHOS 65 68  BILITOT 0.5 1.0  PROT 6.3* 6.6  ALBUMIN 3.1* 3.3*   No results for input(s): LIPASE, AMYLASE in the last 168 hours. No results for input(s): AMMONIA in the last 168 hours. CBC: Recent Labs  Lab 03/22/19 0845 03/25/19 0336 03/25/19 0340 03/25/19 1140  WBC 10.1  --  6.4  --   HGB 14.1 11.9* 13.4 11.9*  HCT 40.6 35.0* 39.0 35.0*  MCV 99.3  --  98.0  --   PLT 366  --  317  --    Cardiac Enzymes: Recent Labs  Lab 03/25/19 0340  CKTOTAL 24*  CKMB 1.3   BNP: Invalid  input(s): POCBNP CBG: Recent Labs  Lab 03/25/19 0306 03/25/19 0523 03/25/19 1206  GLUCAP 106* 108* 174*   D-Dimer Recent Labs    03/25/19 0705  DDIMER 0.49   Hgb A1c No results for input(s): HGBA1C in the last 72 hours. Lipid Profile No results for input(s): CHOL, HDL, LDLCALC, TRIG, CHOLHDL, LDLDIRECT in the last 72 hours. Thyroid function studies No results for input(s): TSH, T4TOTAL, T3FREE, THYROIDAB in the last 72 hours.  Invalid input(s): FREET3 Anemia work up Recent Labs    03/24/19 0005  VITAMINB12 868   Urinalysis    Component Value Date/Time   COLORURINE YELLOW 03/22/2019 1045   APPEARANCEUR CLEAR 03/22/2019 1045   LABSPEC 1.024 03/22/2019 1045   PHURINE 5.0 03/22/2019 1045   GLUCOSEU NEGATIVE 03/22/2019 1045   HGBUR NEGATIVE 03/22/2019 1045   BILIRUBINUR NEGATIVE 03/22/2019 1045   KETONESUR NEGATIVE 03/22/2019 1045   PROTEINUR NEGATIVE 03/22/2019 1045   NITRITE NEGATIVE 03/22/2019 1045   LEUKOCYTESUR MODERATE (A) 03/22/2019 1045   Sepsis Labs Invalid input(s): PROCALCITONIN,  WBC,  LACTICIDVEN Microbiology Recent Results (from the past 240 hour(s))  Culture, Urine     Status: Abnormal   Collection Time: 03/22/19 10:45 AM   Specimen: Urine, Clean Catch  Result Value Ref Range Status   Specimen Description   Final    URINE, CLEAN CATCH Performed at [facility], [address] [place], [place] [address]    Special Requests   Final    NONE Performed at [facility], [address], [place], [place] [address]    Culture MULTIPLE SPECIES PRESENT, SUGGEST RECOLLECTION (A)  Final   Report Status 03/23/2019 FINAL  Final  MRSA PCR Screening     Status: None   Collection Time: 03/25/19  5:15 AM   Specimen: Nasal Mucosa; Nasopharyngeal  Result Value Ref Range Status   MRSA by PCR NEGATIVE NEGATIVE Final    Comment:        The GeneXpert MRSA Assay (FDA approved for NASAL specimens only), is one component of  a comprehensive MRSA colonization surveillance program. It is not intended to diagnose MRSA infection nor to guide or monitor treatment for MRSA infections. Performed at [facility], [address] [place], [place] [address]      Time coordinating discharge: 45 minutes  SIGNED:   Coralie Keens, MD  [facility] 03/26/2019, 1:12 PM

## 2019-03-31 ENCOUNTER — Other Ambulatory Visit: Payer: Self-pay

## 2019-03-31 ENCOUNTER — Emergency Department (HOSPITAL_COMMUNITY): Payer: Medicare Other

## 2019-03-31 ENCOUNTER — Emergency Department (HOSPITAL_COMMUNITY)
Admission: EM | Admit: 2019-03-31 | Discharge: 2019-04-01 | Disposition: A | Discharge status: Home / Self Care | Payer: Medicare Other | Source: Home / Self Care | Attending: Emergency Medicine | Admitting: Emergency Medicine

## 2019-03-31 ENCOUNTER — Encounter (HOSPITAL_COMMUNITY): Payer: Self-pay | Admitting: Emergency Medicine

## 2019-03-31 DIAGNOSIS — R4182 Altered mental status, unspecified: Secondary | ICD-10-CM

## 2019-03-31 DIAGNOSIS — F329 Major depressive disorder, single episode, unspecified: Secondary | ICD-10-CM | POA: Insufficient documentation

## 2019-03-31 DIAGNOSIS — Z79899 Other long term (current) drug therapy: Secondary | ICD-10-CM | POA: Insufficient documentation

## 2019-03-31 DIAGNOSIS — R1084 Generalized abdominal pain: Secondary | ICD-10-CM | POA: Insufficient documentation

## 2019-03-31 DIAGNOSIS — F331 Major depressive disorder, recurrent, moderate: Secondary | ICD-10-CM | POA: Diagnosis present

## 2019-03-31 DIAGNOSIS — G9349 Other encephalopathy: Secondary | ICD-10-CM | POA: Diagnosis not present

## 2019-03-31 DIAGNOSIS — I10 Essential (primary) hypertension: Secondary | ICD-10-CM | POA: Insufficient documentation

## 2019-03-31 DIAGNOSIS — R410 Disorientation, unspecified: Secondary | ICD-10-CM | POA: Diagnosis not present

## 2019-03-31 DIAGNOSIS — U071 COVID-19: Secondary | ICD-10-CM | POA: Insufficient documentation

## 2019-03-31 LAB — COMPREHENSIVE METABOLIC PANEL
ALT: 42 U/L (ref 0–44)
AST: 20 U/L (ref 15–41)
Albumin: 3.2 g/dL — ABNORMAL LOW (ref 3.5–5.0)
Alkaline Phosphatase: 77 U/L (ref 38–126)
Anion gap: 10 (ref 5–15)
BUN: 22 mg/dL (ref 8–23)
CO2: 24 mmol/L (ref 22–32)
Calcium: 8.9 mg/dL (ref 8.9–10.3)
Chloride: 103 mmol/L (ref 98–111)
Creatinine, Ser: 0.9 mg/dL (ref 0.44–1.00)
GFR calc Af Amer: >60 mL/min (ref >60)
GFR calc non Af Amer: >60 mL/min (ref >60)
Glucose, Bld: 113 mg/dL — ABNORMAL HIGH (ref 70–99)
Potassium: 3.4 mmol/L — ABNORMAL LOW (ref 3.5–5.1)
Sodium: 137 mmol/L (ref 135–145)
Total Bilirubin: 0.7 mg/dL (ref 0.3–1.2)
Total Protein: 6.4 g/dL — ABNORMAL LOW (ref 6.5–8.1)

## 2019-03-31 LAB — CBC WITH DIFFERENTIAL/PLATELET
Abs Immature Granulocytes: 0.02 10*3/uL (ref 0.00–0.07)
Basophils Absolute: 0 10*3/uL (ref 0.0–0.1)
Basophils Relative: 1 %
Eosinophils Absolute: 0.1 10*3/uL (ref 0.0–0.5)
Eosinophils Relative: 2 %
HCT: 37.1 % (ref 36.0–46.0)
Hemoglobin: 11.9 g/dL — ABNORMAL LOW (ref 12.0–15.0)
Immature Granulocytes: 0 %
Lymphocytes Relative: 27 %
Lymphs Abs: 1.5 10*3/uL (ref 0.7–4.0)
MCH: 31.5 pg (ref 26.0–34.0)
MCHC: 32.1 g/dL (ref 30.0–36.0)
MCV: 98.1 fL (ref 80.0–100.0)
Monocytes Absolute: 0.5 10*3/uL (ref 0.1–1.0)
Monocytes Relative: 8 %
Neutro Abs: 3.6 10*3/uL (ref 1.7–7.7)
Neutrophils Relative %: 62 %
Platelets: 281 10*3/uL (ref 150–400)
RBC: 3.78 MIL/uL — ABNORMAL LOW (ref 3.87–5.11)
RDW: 13.2 % (ref 11.5–15.5)
WBC: 5.7 10*3/uL (ref 4.0–10.5)
nRBC: 0 % (ref 0.0–0.2)

## 2019-03-31 LAB — TROPONIN I (HIGH SENSITIVITY)
Troponin I (High Sensitivity): 3 ng/L (ref <18)
Troponin I (High Sensitivity): 3 ng/L (ref <18)

## 2019-03-31 LAB — URINALYSIS, ROUTINE W REFLEX MICROSCOPIC
Bilirubin Urine: NEGATIVE
Glucose, UA: NEGATIVE mg/dL
Hgb urine dipstick: NEGATIVE
Ketones, ur: NEGATIVE mg/dL
Leukocytes,Ua: NEGATIVE
Nitrite: NEGATIVE
Protein, ur: NEGATIVE mg/dL
Specific Gravity, Urine: >1.046 — ABNORMAL HIGH (ref 1.005–1.030)
pH: 6 (ref 5.0–8.0)

## 2019-03-31 LAB — ETHANOL: Alcohol, Ethyl (B): <10 mg/dL (ref <10)

## 2019-03-31 LAB — LIPASE, BLOOD: Lipase: 30 U/L (ref 11–51)

## 2019-03-31 IMAGING — DX DG CHEST 1V PORT
1 series · 1 of 1 positions shown · non-contrast
Comparison: [DATE]

CLINICAL DATA: Altered level of consciousness and weakness.
Coronavirus infection.

EXAM:
PORTABLE CHEST 1 VIEW

[chest ap]
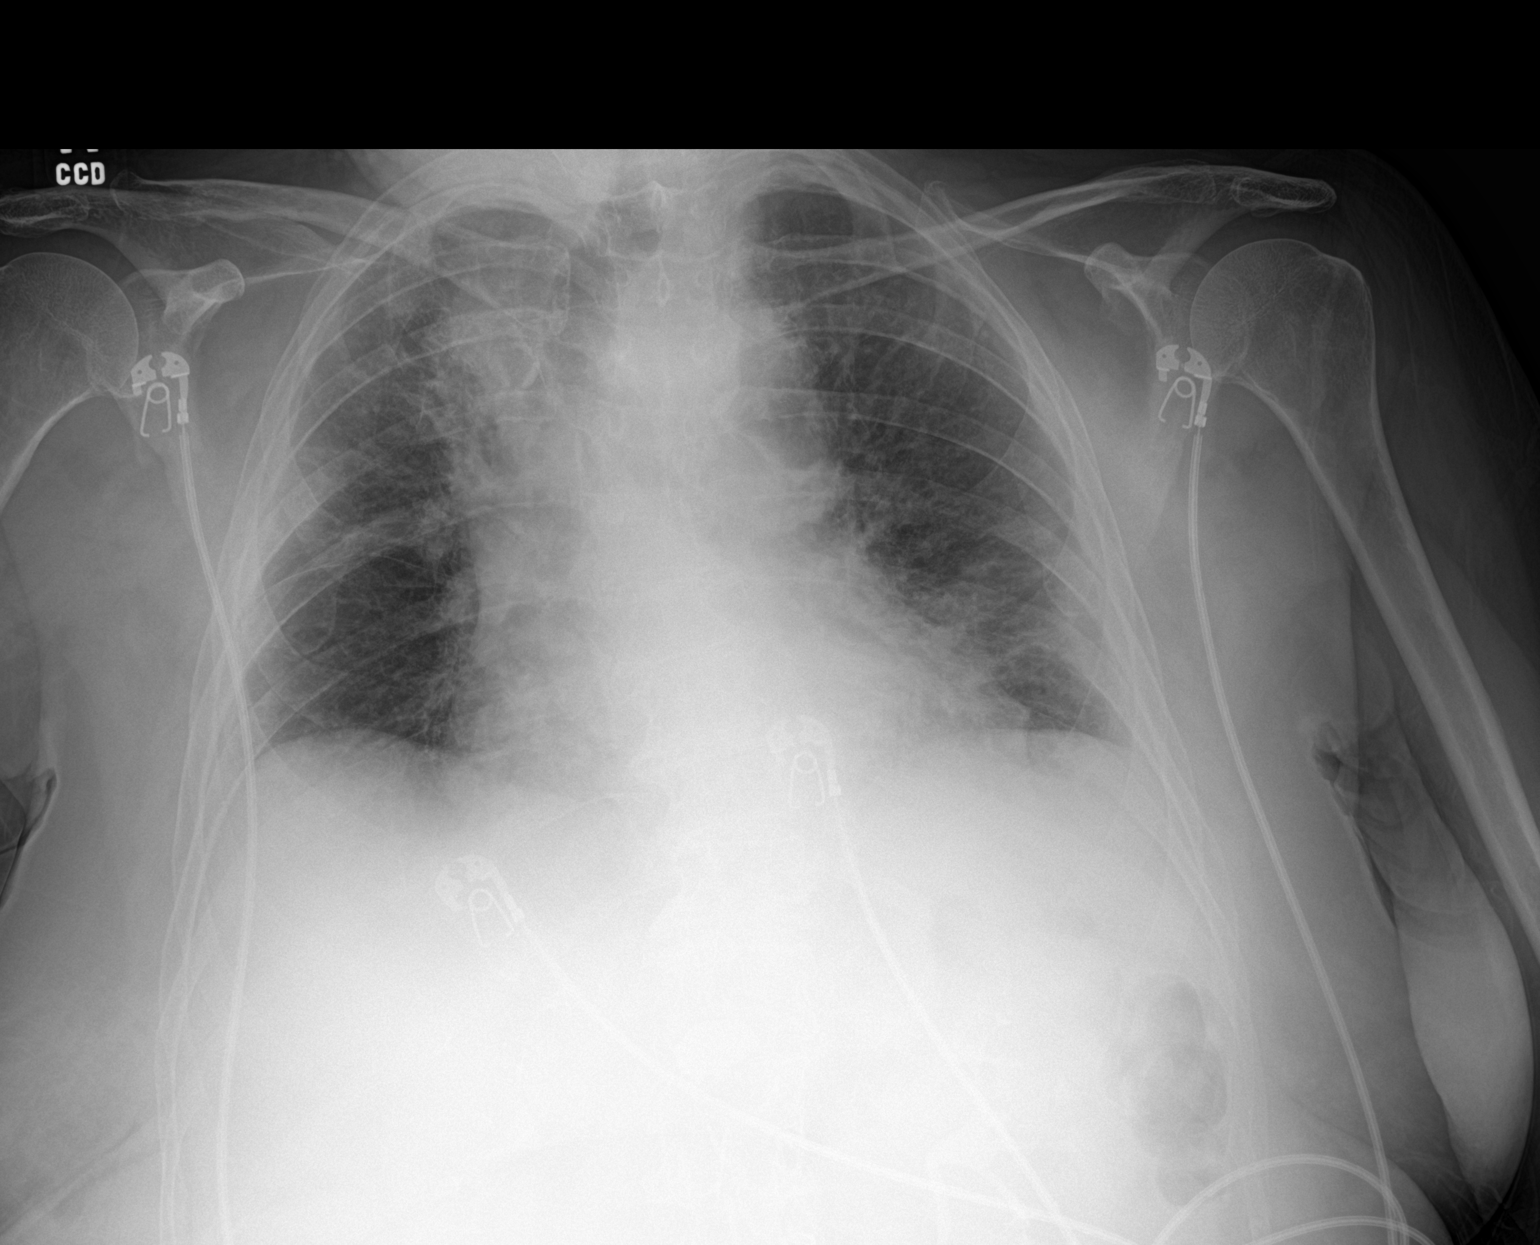

[1 of 1 positions shown; findings below may reference images not displayed]

FINDINGS: Heart size is normal. Chronic aortic atherosclerosis. Patchy
bilateral perihilar pulmonary density could represent sequela of
viral pneumonia. No evidence of dense consolidation or lobar
collapse. No visible effusion. No change since the study of 2 days
ago.
IMPRESSION: Patchy perihilar density could represent viral pneumonia. No
significant change since 2 days ago.

## 2019-03-31 IMAGING — CT CT ABD-PELV W/ CM
2 of 5 series · 16 of 46 positions shown, 18 images · IV contrast (OMNIPAQUE 300)
Comparison: Chest CT [DATE]

CLINICAL DATA: Generalized abdominal pain.  Fever and confusion.

EXAM:
CT ABDOMEN AND PELVIS WITH CONTRAST
TECHNIQUE: Multidetector CT imaging of the abdomen and pelvis was performed
using the standard protocol following bolus administration of
intravenous contrast.
CONTRAST:  100mL OMNIPAQUE IOHEXOL 300 MG/ML  SOLN

[Series 2: axial st · axial · 0.89mm/px · z∈[+959,+1344]mm · 13 of 89 slices shown, 15 images]
[im 6/89  soft-tissue]
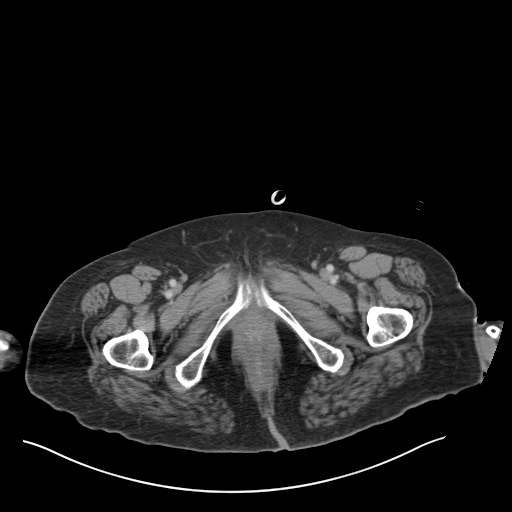
[im 6/89  bone]
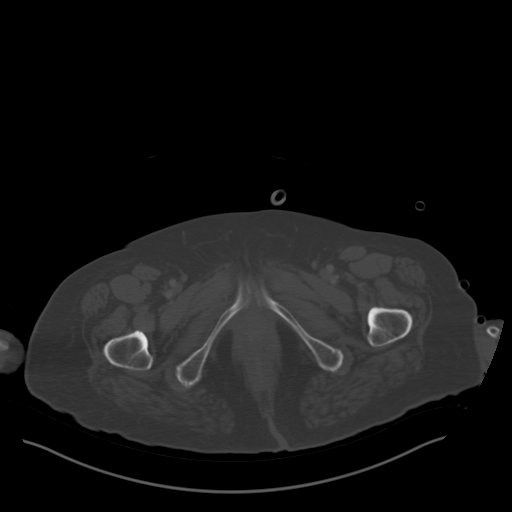
[im 12/89  soft-tissue]
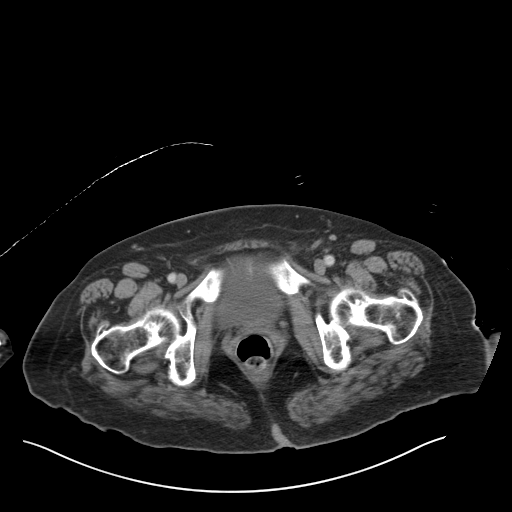
[im 18/89  soft-tissue]
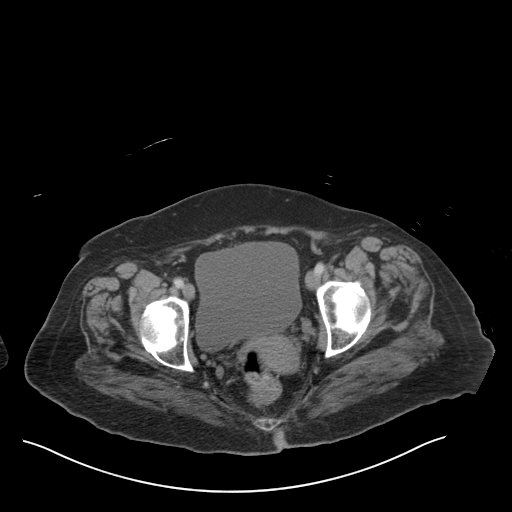
[im 24/89  soft-tissue]
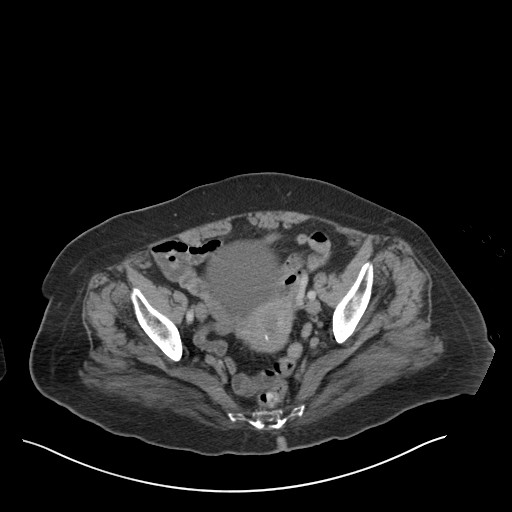
[im 30/89  soft-tissue]
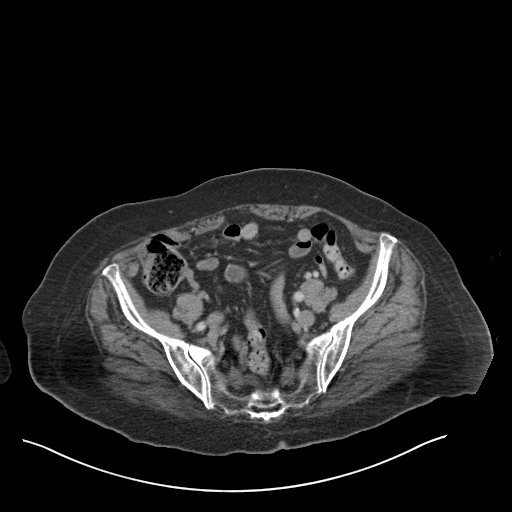
[im 36/89  soft-tissue]
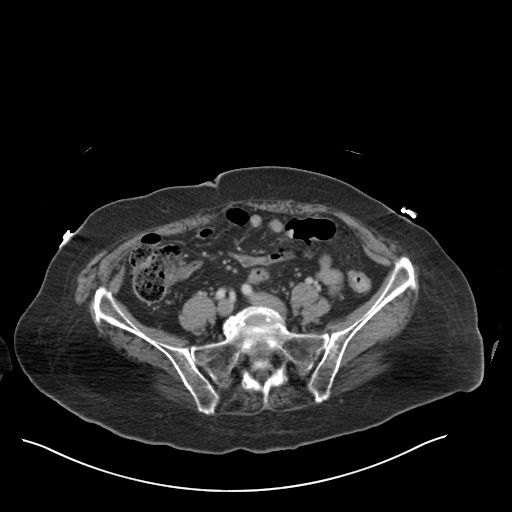
[im 47/89  soft-tissue]
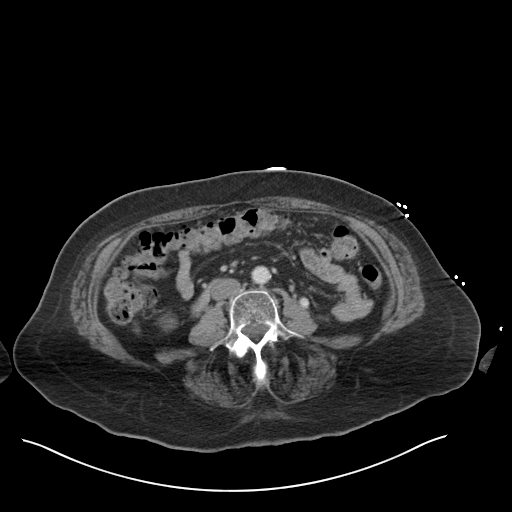
[im 53/89  soft-tissue]
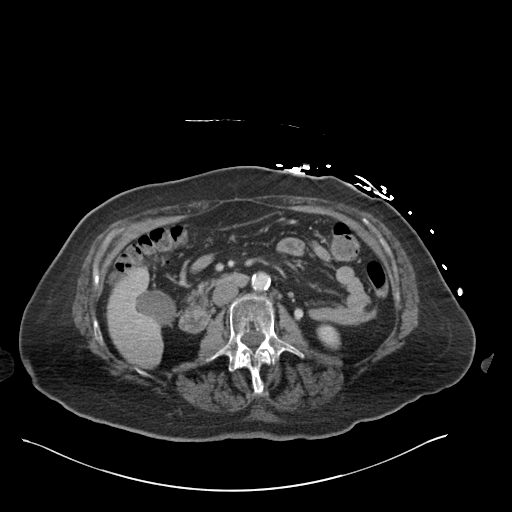
[im 59/89  soft-tissue]
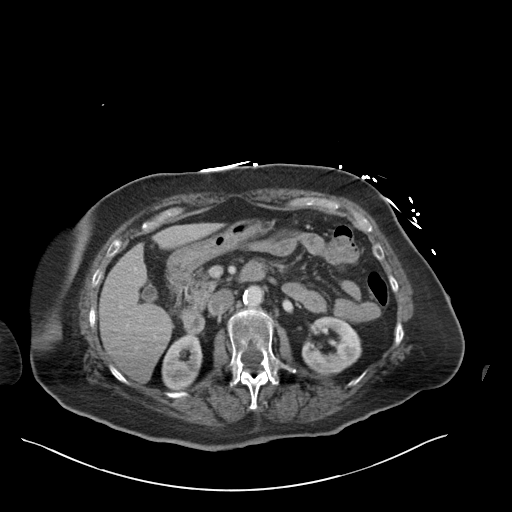
[im 59/89  bone]
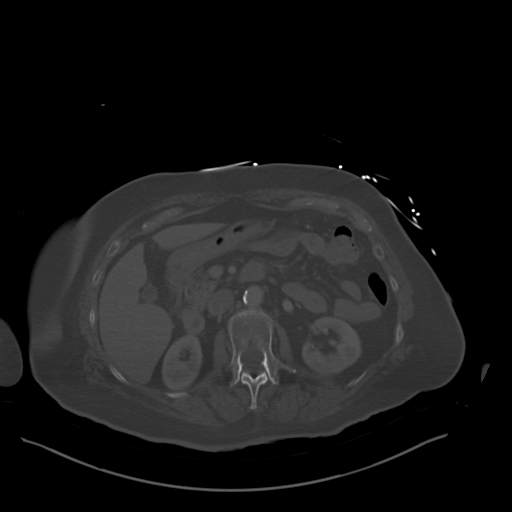
[im 65/89  soft-tissue]
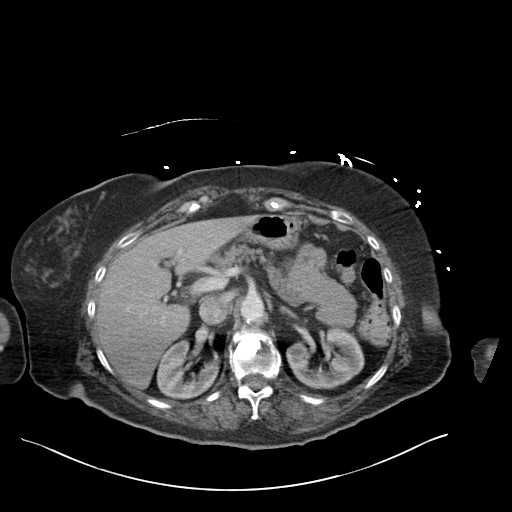
[im 71/89  soft-tissue]
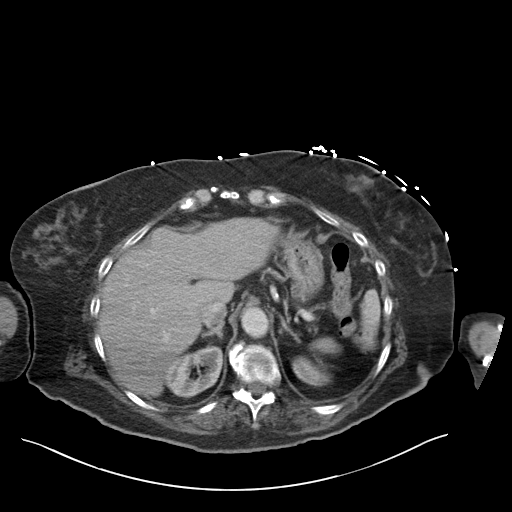
[im 77/89  soft-tissue]
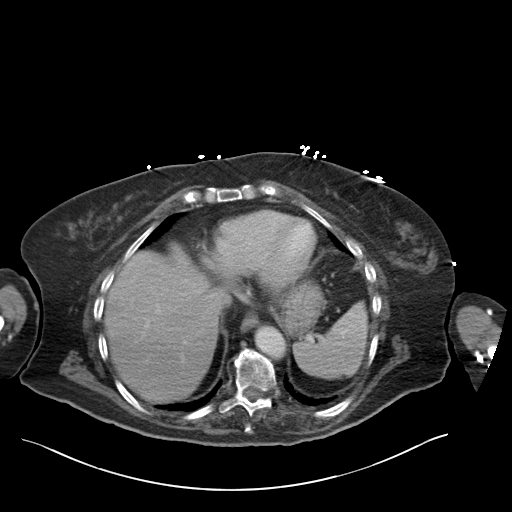
[im 83/89  soft-tissue]
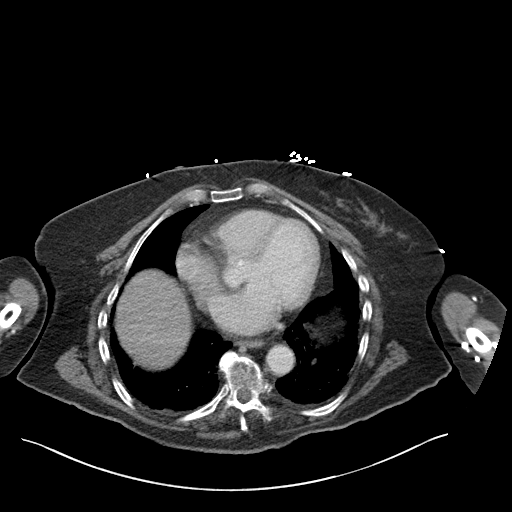

[Series 4: coronal st · coronal · 0.86mm/px · 3 of 114 slices shown]
[im 38/114  soft-tissue]
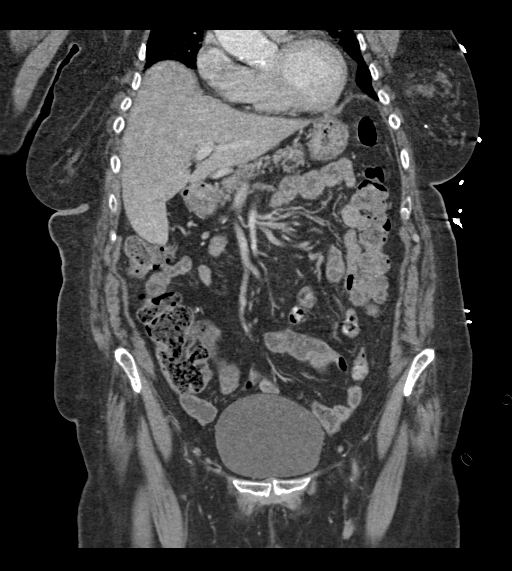
[im 51/114  soft-tissue]
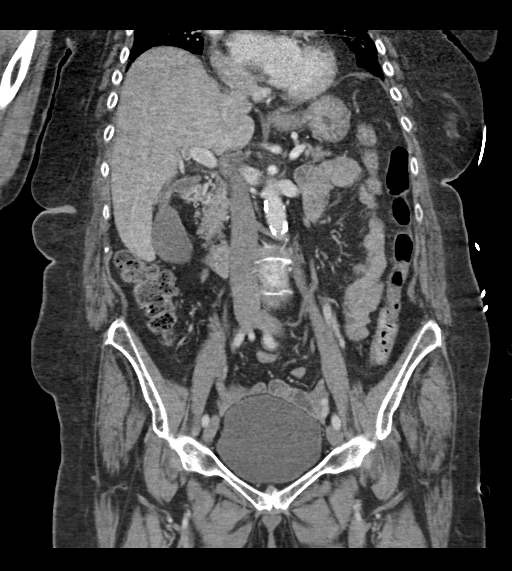
[im 63/114  soft-tissue]
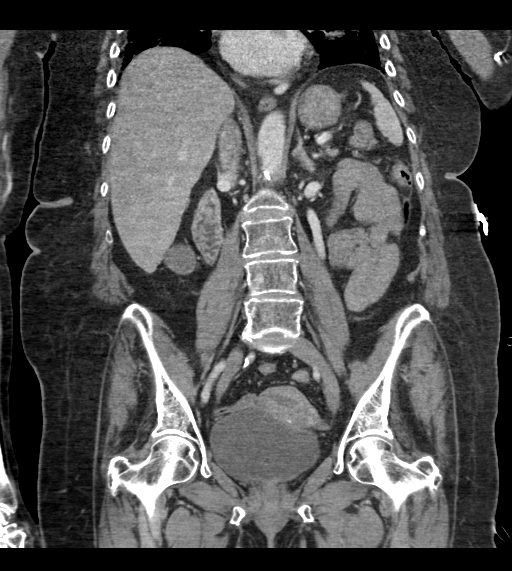

[16 of 46 positions shown; findings below may reference images not displayed]

FINDINGS: Lower chest: Patchy E bibasilar infiltrates and scattered small
pulmonary nodules. No pleural effusions. The heart is borderline
enlarged. No pericardial effusion. Coronary artery and aortic
calcifications are noted.

Hepatobiliary: No focal hepatic lesions or intrahepatic biliary
dilatation. The gallbladder appears normal. No common bile duct
dilatation.

Pancreas: No mass, inflammation or ductal dilatation.

Spleen: Normal size.  No focal lesions.

Adrenals/Urinary Tract: The adrenal glands are normal.

No renal, ureteral or bladder calculi or mass. Mild distention of
the bladder.

Stomach/Bowel: The stomach, duodenum, small bowel and colon are
unremarkable. No acute inflammatory changes, mass lesions or
obstructive findings. The terminal ileum and appendix are normal.

Vascular/Lymphatic: Moderate atherosclerotic calcifications
involving the aorta and branch vessels but no aneurysm. The major
venous structures are patent.

No mesenteric or retroperitoneal mass or adenopathy. Small scattered
lymph nodes are noted.

Reproductive: Uterine fibroids are noted. The endometrium is
borderline measuring 7.5 mm. Follow-up pelvic ultrasound examination
is suggested. Both ovaries appear normal.

Other: No free pelvic fluid collections. No inguinal mass or
adenopathy.

Musculoskeletal: No significant bony findings. Vertebral
augmentation changes are noted at L1 with moderate retropulsion.
Osteoporosis is noted.
IMPRESSION: 1. No acute abdominal/pelvic findings, mass lesions or adenopathy.
2. Patchy bilateral lower lobe infiltrates and a few small pulmonary
nodules. This appears slightly progressive when compared to the
prior CT scan where most of the infiltrates or upper lobe
predominant. The small nodules are likely inflammatory.
3. Slightly prominent endometrium for age. Recommend follow-up
pelvic ultrasound examination. Uterine fibroids are noted. The
ovaries are normal.
4. Stable vertebral augmentation changes at L1.

## 2019-03-31 IMAGING — CT CT HEAD W/O CM
3 series · 14 of 47 positions shown, 16 images · non-contrast
Comparison: [DATE]

CLINICAL DATA: Altered mental status

EXAM:
CT HEAD WITHOUT CONTRAST
TECHNIQUE: Contiguous axial images were obtained from the base of the skull
through the vertex without intravenous contrast.

[Series 2: head wo · axial · 0.47mm/px · z∈[+1589,+1724]mm · 8 of 33 slices shown, 10 images]
[im 3/33  brain]
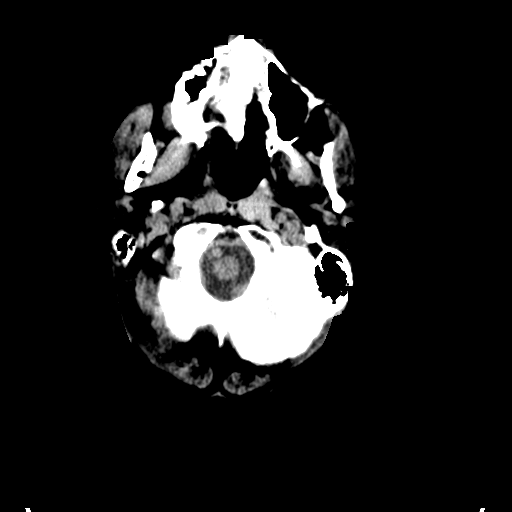
[im 3/33  bone]
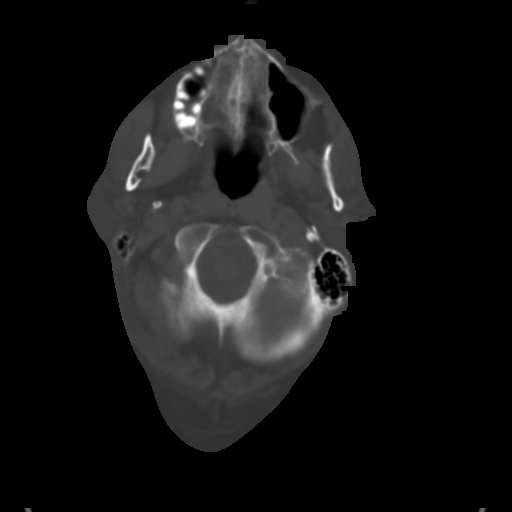
[im 7/33  brain]
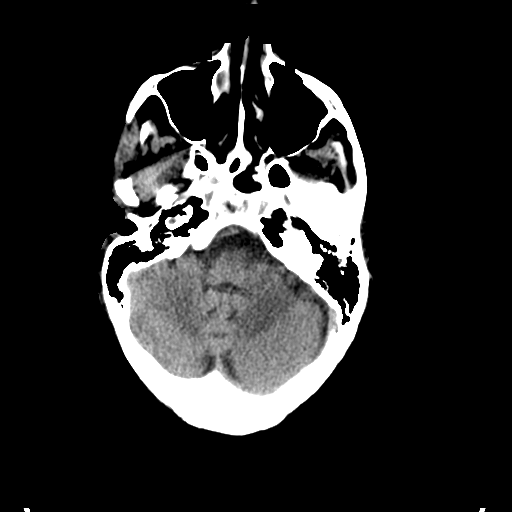
[im 10/33  brain]
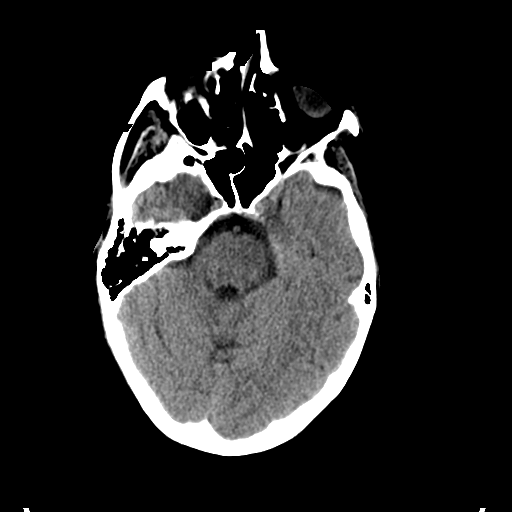
[im 15/33  brain]
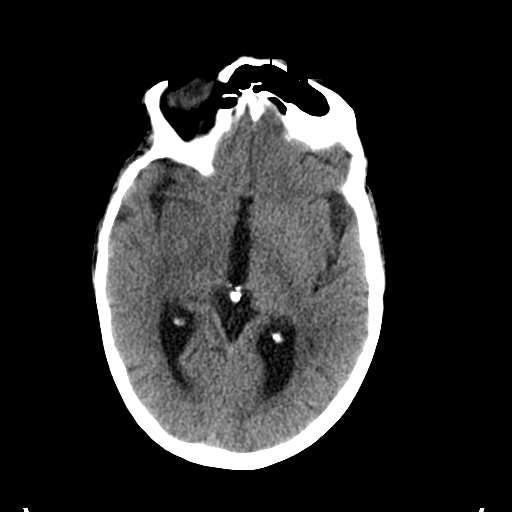
[im 18/33  brain]
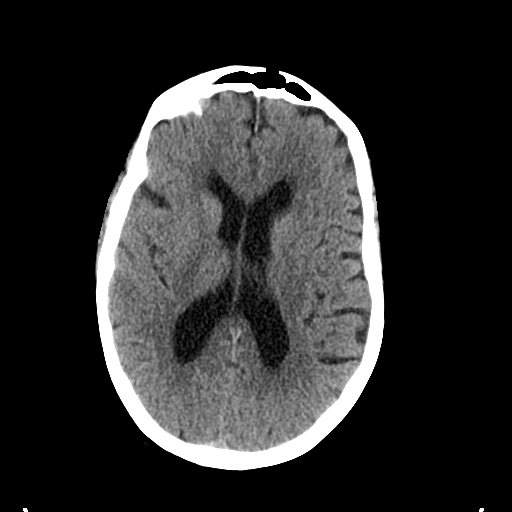
[im 18/33  bone]
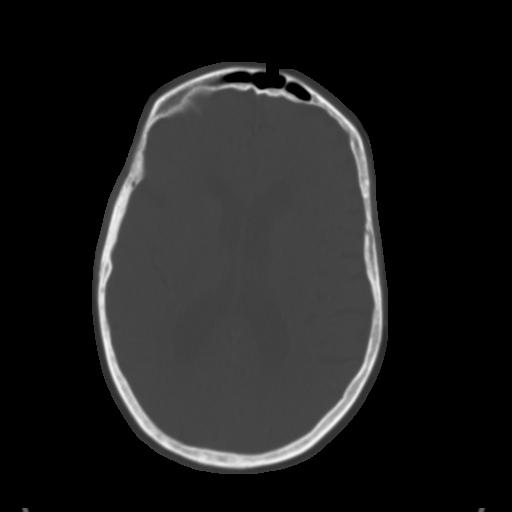
[im 23/33  brain]
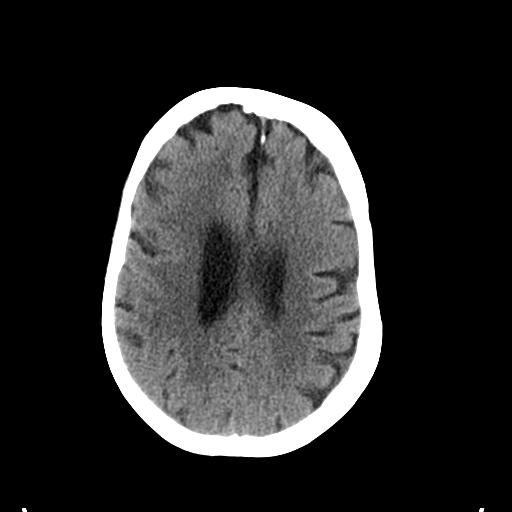
[im 26/33  brain]
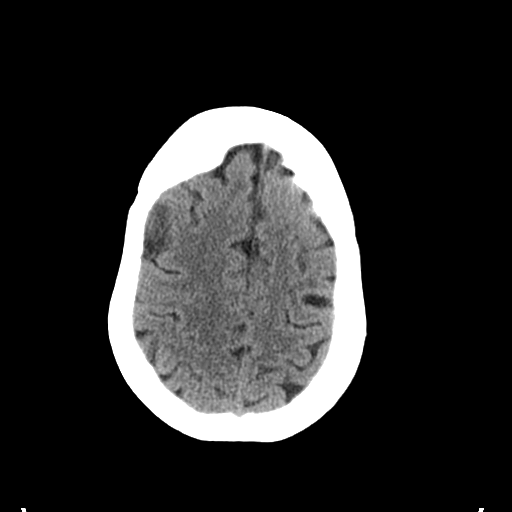
[im 30/33  brain]
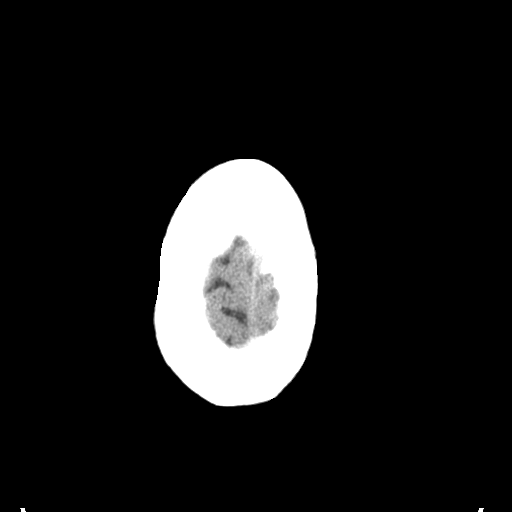

[Series 4: coronal soft tissue · coronal · 0.39mm/px · 3 of 65 slices shown]
[im 22/65  brain]
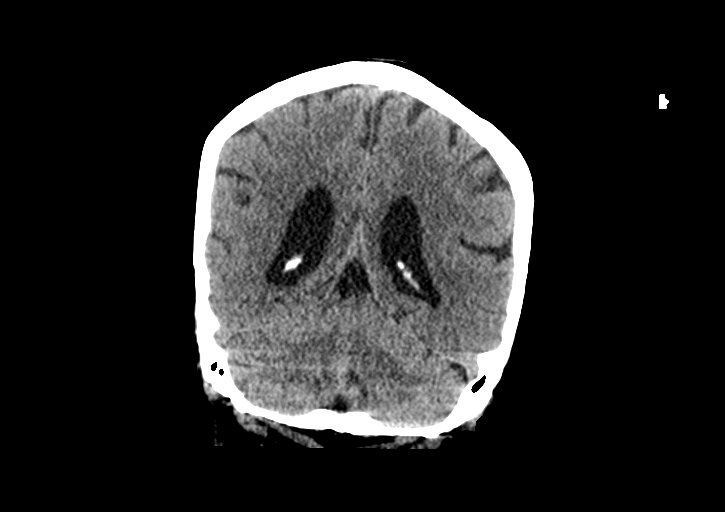
[im 29/65  brain]
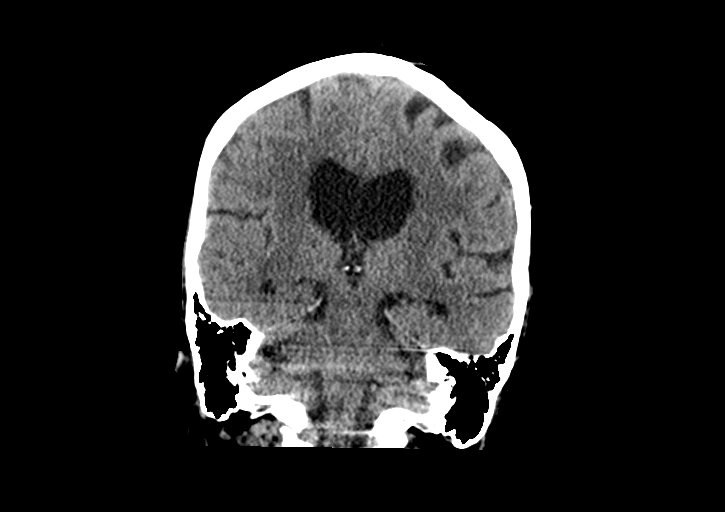
[im 36/65  brain]
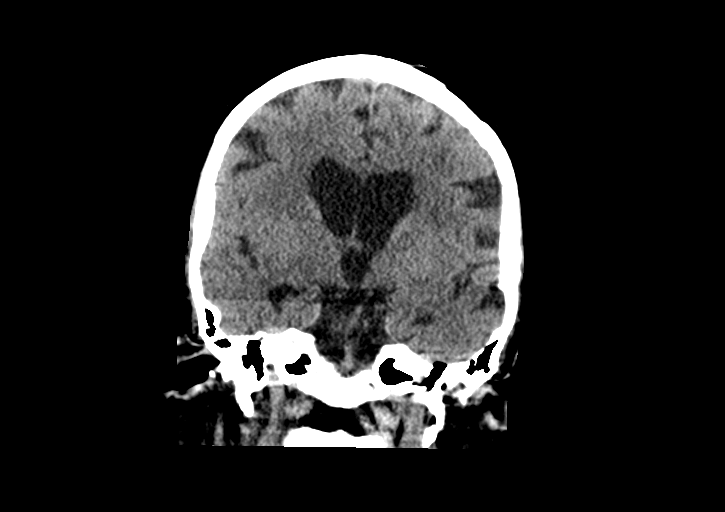

[Series 5: sagittal soft tissue · sagittal · 0.40mm/px · 3 of 48 slices shown]
[im 16/48  brain]
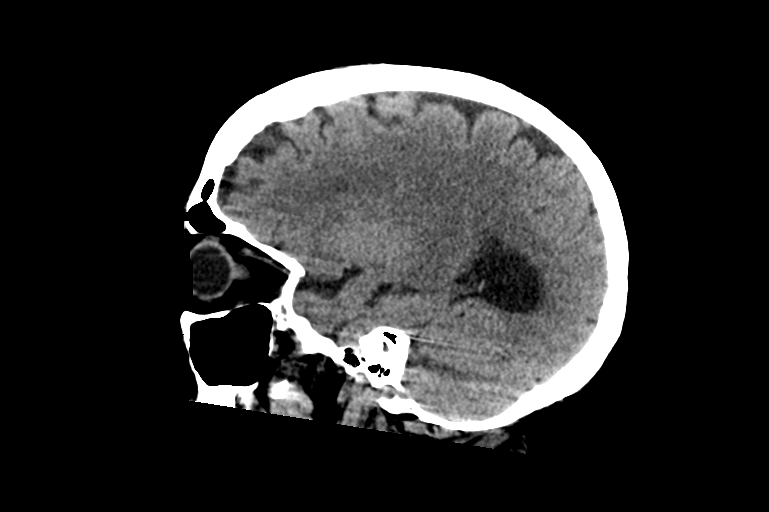
[im 24/48  brain]
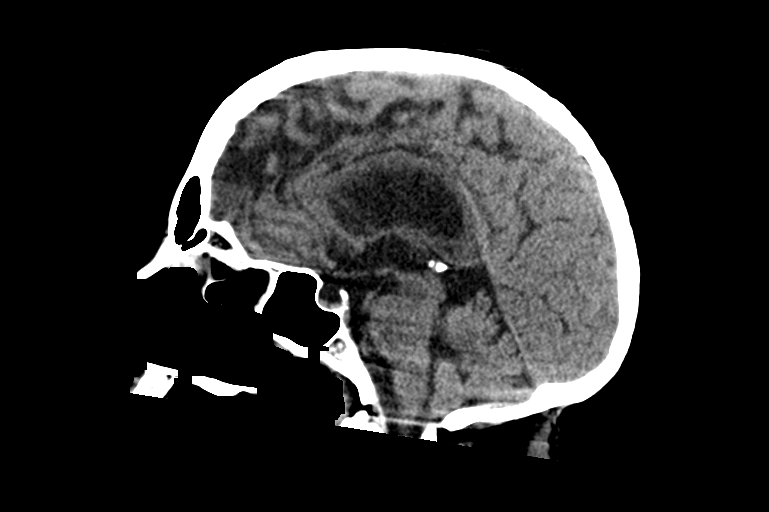
[im 32/48  brain]
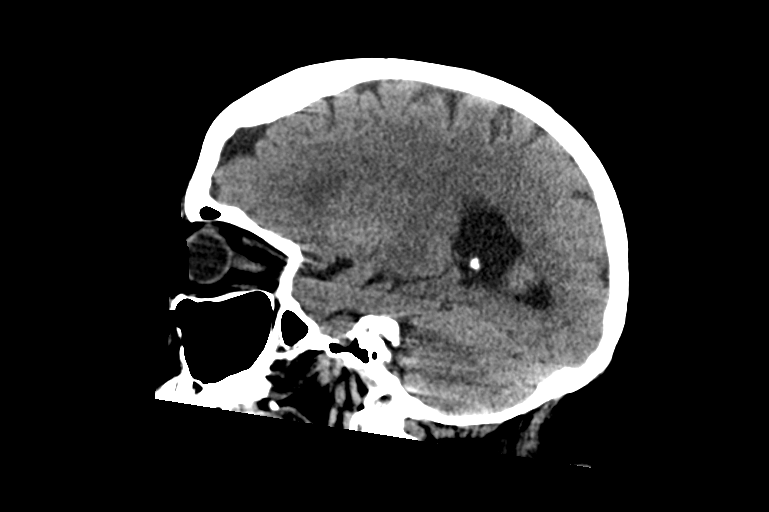

[14 of 47 positions shown; findings below may reference images not displayed]

FINDINGS: Brain: No evidence of acute infarction, hemorrhage, hydrocephalus,
extra-axial collection or mass lesion/mass effect. Periventricular
and deep white matter hypodensity.

Vascular: No hyperdense vessel or unexpected calcification.

Skull: Normal. Negative for fracture or focal lesion.

Sinuses/Orbits: No acute finding.

Other: None.
IMPRESSION: No acute intracranial pathology.  Small-vessel white matter disease.

## 2019-03-31 MED ORDER — IRBESARTAN 75 MG PO TABS
37.5000 mg | ORAL_TABLET | Freq: Every day | ORAL | Status: DC
  Administered 2019-04-01 (×2): 37.5 mg via ORAL
  Filled 2019-03-31 (×2): qty 0.5

## 2019-03-31 MED ORDER — IOHEXOL 300 MG/ML  SOLN
100.0000 mL | Freq: Once | INTRAMUSCULAR | Status: AC | PRN
  Administered 2019-03-31: 100 mL via INTRAVENOUS

## 2019-03-31 MED ORDER — EZETIMIBE 10 MG PO TABS
10.0000 mg | ORAL_TABLET | Freq: Every day | ORAL | Status: DC
  Administered 2019-04-01 (×2): 10 mg via ORAL
  Filled 2019-03-31 (×2): qty 1

## 2019-03-31 MED ORDER — TRIAMTERENE-HCTZ 37.5-25 MG PO TABS
1.0000 | ORAL_TABLET | Freq: Every day | ORAL | Status: DC
  Administered 2019-04-01 (×2): 1 via ORAL
  Filled 2019-03-31 (×2): qty 1

## 2019-03-31 MED ORDER — ESCITALOPRAM OXALATE 10 MG PO TABS
5.0000 mg | ORAL_TABLET | Freq: Every day | ORAL | Status: DC
  Administered 2019-04-01 (×2): 5 mg via ORAL
  Filled 2019-03-31 (×2): qty 1

## 2019-03-31 MED ORDER — CARVEDILOL 6.25 MG PO TABS
6.2500 mg | ORAL_TABLET | Freq: Two times a day (BID) | ORAL | Status: DC
  Administered 2019-04-01 (×2): 6.25 mg via ORAL
  Filled 2019-03-31 (×3): qty 1

## 2019-03-31 MED ORDER — SODIUM CHLORIDE 0.9 % IV BOLUS
1000.0000 mL | Freq: Once | INTRAVENOUS | Status: AC
  Administered 2019-03-31: 1000 mL via INTRAVENOUS

## 2019-03-31 MED ORDER — LORAZEPAM 2 MG/ML IJ SOLN
1.0000 mg | Freq: Once | INTRAMUSCULAR | Status: AC
  Administered 2019-03-31: 1 mg via INTRAVENOUS
  Filled 2019-03-31: qty 1

## 2019-03-31 MED ORDER — LEVOTHYROXINE SODIUM 75 MCG PO TABS
75.0000 ug | ORAL_TABLET | Freq: Every day | ORAL | Status: DC
  Administered 2019-04-01: 75 ug via ORAL
  Filled 2019-03-31: qty 1

## 2019-03-31 MED ORDER — SODIUM CHLORIDE (PF) 0.9 % IJ SOLN
INTRAMUSCULAR | Status: AC
  Administered 2019-03-31: 14:00:00
  Filled 2019-03-31: qty 50

## 2019-03-31 NOTE — ED Notes (Signed)
The daughter for this patient called to get an update. She said her father is hard of hearing and asked her to call, Jamal Collin [phone].

## 2019-03-31 NOTE — ED Notes (Signed)
Awaiting TTS consult.  

## 2019-03-31 NOTE — BH Assessment (Signed)
Tele Assessment Note   Patient Name: Debra Morgan MRN: [id number] Referring Physician: Dr. Judd Lienelo [facility] Location of Provider: Behavioral [facility] TTS Department  Debra Morgan is an 73 y.o. female.  -Clinician reviewed note by Dr. Judd Lienelo.  Patient is a 73 year old female with past medical history of hypertension, depression, and recent admission for COVID-19.  [facility].  She was to go to rehab, but instead declined and ended up going back home.  After returning home for several days, she was doing better until this morning when she developed acute confusion and "hurting all over".  911 was called and the patient was transported here.  She initially arrived here very anxious and replying "I do not know" to every question asked of her. According to the patient's chart, it appears as though she had a similar episode while in the hospital for which no definitive cause was found and this was felt to likely be related to a psychogenic response to her illness and being in the hospital.  Pt said that [facility].  She said that she was "hurting all over."  She was able to talk in detail about her medical conditions.  She talked about her physical limitations and how they have impacted her life.  She says she used to be able to do much more.    Patient says that she has no thoughts of killing herself and has no HI.  Patient denies any use of substances.  Patient does say that he will see her dead parents and hear them.  She also will see other people who have died.  Patient says "It is in my mind."  When asked if she knew that they were not real she says "yes."  Patient says "I see them as if they are sitting in that chair over there."  Patient goes on to say, "this is physical, this is not mental."    Patient has poor eye contact.  She is not currently reacting to internal stimuli.  Patient reports sleeping during the day and having  "repetative thoughts" at night.  Patient is currently logical and coherent.  Patient says she does not want to go home now, that she feels safe in the hospital.    Patient gave permission to talk to husband.  He said that patient did not have any previous incidents of hearing & seeing things until this past weekend.  Patient had incident of seeing & hearing parents on Monday and it lasted a few hours.  She then stopped doing it and was back to herself.  Patient last night around 03:00 woke up husband talking about seeing her parents and other people from the past.  Patient did not calm down . He told her that he would be taking her to the hospital and she initially refused to go.  She finally went with him.  Patient had COVID 19 around 03-07-19 according to husband.  [facility] and [facility] over the last few weeks.  Husband said he does not know what to do because these episodes of responding to internal stimuli have no precedent events.    Husband said he [facility] take [facility] a few months ago and they wanted to do hypnosis but she did not consent.  Patient has no previous inpatient psychiatric care.  Clinician spoke with Renaye RakersAdaku Anike, NP who recommended patient be  observed at [facility] overnight and have psychiatry see her in AM.  Clinician talked with Dr. Eulis Foster who is in agreement with patient being seen by psychiatry in AM.    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 Dystonia   . High cholesterol   . Hypertension   . Thyroid disease     Past Surgical History:  Procedure Laterality Date  . TUBAL LIGATION      Family History: No family history on file.  Social History:  reports that she has never smoked. She has never used smokeless tobacco. She reports current alcohol use. She reports that she does not use drugs.  Additional Social History:   Alcohol / Drug Use Pain Medications: See PTA medication list Prescriptions: See PTA medication list Over the Counter: See PTA medication list History of alcohol / drug use?: No history of alcohol / drug abuse  CIWA: CIWA-Ar BP: 140/75 Pulse Rate: 75 COWS:    Allergies:  Allergies  Allergen Reactions  . Sinemet [Carbidopa W-Levodopa] Other (See Comments)    Feels "jittery"  . Codeine Palpitations  . Tetracyclines & Related Rash    Home Medications: (Not in a hospital admission)   OB/GYN Status:  No LMP recorded. Patient is postmenopausal.  General Assessment Data Assessment unable to be completed: Yes Reason for not completing assessment: (patient sedated) Location of Assessment: [facility] ED TTS Assessment: In system Is this a Tele or Face-to-Face Assessment?: Tele Assessment Is this an Initial Assessment or a Re-assessment for this encounter?: Initial Assessment Patient Accompanied by:: N/A Language Other than English: No Living Arrangements: Other (Comment)(Lives in a home w/ her husband.) What gender do you identify as?: Female Marital status: Married(Married 77 years.) Elwin Sleight name: [place] Pregnancy Status: No Living Arrangements: Spouse/significant other Can pt return to current living arrangement?: Yes Admission Status: Voluntary Is patient capable of signing voluntary admission?: Yes Referral Source: Self/Family/Friend(Husband brought her to hospital.) Insurance type: [facility]     Crisis Care Plan Living Arrangements: Spouse/significant other Name of Psychiatrist: None Name of Therapist: None  Education Status Is patient currently in school?: No Is the patient employed, unemployed or receiving disability?: Unemployed(Reftired)  Risk to self with the past 6 months Suicidal Ideation: No Has patient been a risk to self within the past 6 months prior to admission? : No Suicidal Intent: No Has patient had any suicidal intent within the past 6 months prior to  admission? : No Is patient at risk for suicide?: No Suicidal Plan?: No Has patient had any suicidal plan within the past 6 months prior to admission? : No Access to Means: No What has been your use of drugs/alcohol within the last 12 months?: N/A Previous Attempts/Gestures: No How many times?: 0 Other Self Harm Risks: None Triggers for Past Attempts: None known Intentional Self Injurious Behavior: None Family Suicide History: No Recent stressful life event(s): Recent negative physical changes Persecutory voices/beliefs?: No Depression: Yes Depression Symptoms: Despondent, Isolating, Insomnia, Feeling worthless/self pity Substance abuse history and/or treatment for substance abuse?: No Suicide prevention information given to non-admitted patients: Not applicable  Risk to Others within the past 6 months Homicidal Ideation: No Does patient have any lifetime risk of violence toward others beyond the six months prior to admission? : No Thoughts of Harm to Others: No Current Homicidal Intent: No Current Homicidal Plan: No Access to Homicidal Means: No Identified Victim: No one History of harm to  others?: No Assessment of Violence: None Noted Violent Behavior Description: None reported Does patient have access to weapons?: No Criminal Charges Pending?: No Does patient have a court date: No Is patient on probation?: No  Psychosis Hallucinations: Auditory, Visual(Sees her parents and they talk to her.  ) Delusions: None noted  Mental Status Report Appearance/Hygiene: In hospital gown Eye Contact: Fair Motor Activity: Freedom of movement, Unsteady Speech: Logical/coherent Level of Consciousness: Alert Mood: Depressed, Helpless, Sad Affect: Depressed Anxiety Level: Moderate Thought Processes: Coherent, Relevant Judgement: Partial Orientation: Person, Situation, Time Obsessive Compulsive Thoughts/Behaviors: None  Cognitive Functioning Concentration: Fair Memory: Recent  Impaired, Remote Intact Is patient IDD: No Insight: Poor Impulse Control: Poor Appetite: Poor Have you had any weight changes? : Loss Amount of the weight change? (lbs): (Pt does not know) Sleep: No Change Total Hours of Sleep: (Sleeps during day and will be up at night) Vegetative Symptoms: Staying in bed  ADLScreening [facility] Assessment Services) Patient's cognitive ability adequate to safely complete daily activities?: Yes Patient able to express need for assistance with ADLs?: Yes  Prior Inpatient Therapy Prior Inpatient Therapy: No  Prior Outpatient Therapy Prior Outpatient Therapy: No Does patient have an ACCT team?: No Does patient have Intensive In-House Services?  : No Does patient have [facility] services? : No Does patient have P4CC services?: No  ADL Screening (condition at time of admission) Patient's cognitive ability adequate to safely complete daily activities?: Yes Is the patient deaf or have difficulty hearing?: No Does the patient have difficulty seeing, even when wearing glasses/contacts?: No Does the patient have difficulty concentrating, remembering, or making decisions?: Yes Patient able to express need for assistance with ADLs?: Yes Does the patient have difficulty dressing or bathing?: Yes Communication: Independent Dressing (OT): Independent(Uses the side of the bed to dress.) Is this a change from baseline?: Pre-admission baseline Grooming: Independent(Takes extra time.) Is this a change from baseline?: Pre-admission baseline Feeding: Needs assistance(Others must prepare foods.) Is this a change from baseline?: Pre-admission baseline Bathing: Needs assistance(Bird baths at sink.) Is this a change from baseline?: Pre-admission baseline Toileting: Needs assistance(Cannot pivot to sit in "potty chair.") Is this a change from baseline?: Pre-admission baseline In/Out Bed: Independent with device (comment) Is this a change from baseline?: Pre-admission  baseline Walks in Home: Dependent(Uses a wheelchair) Does the patient have difficulty walking or climbing stairs?: Yes(Uses a wheelchair) Weakness of Legs: Both(Uses a wheelchair for mobility.) Weakness of Arms/Hands: Both(Grip is poor.)       Abuse/Neglect Assessment (Assessment to be complete while patient is alone) Abuse/Neglect Assessment Can Be Completed: Yes Physical Abuse: Denies Verbal Abuse: Denies Sexual Abuse: Denies Exploitation of patient/patient's resources: Denies Self-Neglect: Denies     Merchant navy officer (For Healthcare) Does Patient Have a Medical Advance Directive?: Yes Type of Advance Directive: Healthcare Power of Attorney, Living will Copy of Healthcare Power of Attorney in Chart?: No - copy requested Copy of Living Will in Chart?: No - copy requested Would patient like information on creating a medical advance directive?: No - Patient declined          Disposition:  Disposition Initial Assessment Completed for this Encounter: Yes Patient referred to: (AM Psych evaluation.)  This service was provided via telemedicine using a 2-way, interactive audio and Immunologist.  Names of all persons participating in this telemedicine service and their role in this encounter. Name: Khrystyna Schwalm Role: patient  Name: Roe Coombs Alumbaugh Role: husband  Name: Beatriz Stallion, M.S. LCAS QP Role: clinician  Name:  Role:     [place] Lodge 03/31/2019 8:58 PM

## 2019-03-31 NOTE — ED Notes (Signed)
EDP at bedside  

## 2019-03-31 NOTE — ED Provider Notes (Signed)
[facility] DEPT Provider Note   CSN: [id number] Arrival date & time: 03/31/19  1012     History   Chief Complaint Chief Complaint  Patient presents with  . Altered Mental Status    HPI Debra Morgan is a 73 y.o. female.     Patient is a 73 year old female with past medical history of depressive disorder, hypertension, and recent admission for COVID-19 pneumonia.  Patient brought here by family for evaluation of altered mental status.  When attempting to take a history from this patient, she replies "I do not know" to every question asked of her including her name, how she got to the ER, where she lives, and who she lives with.  Patient is moaning and saying that "everything hurts".  The history is provided by the patient.  Altered Mental Status Presenting symptoms: disorientation   Severity:  Moderate Timing:  Constant Progression:  Worsening   Past Medical History:  Diagnosis Date  . Dystonia   . High cholesterol   . Hypertension   . Thyroid disease     Patient Active Problem List   Diagnosis Date Noted  . Major depressive disorder, recurrent episode, moderate ([place]) 03/21/2019  . Generalized weakness 03/16/2019  . Thyroid disease   . Dystonia   . Pneumonia due to COVID-19 virus   . Elevated transaminase level   . Hypertension   . Chest pain   . COVID-19 virus infection     Past Surgical History:  Procedure Laterality Date  . TUBAL LIGATION       OB History   No obstetric history on file.      Home Medications    Prior to Admission medications   Medication Sig Start Date End Date Taking? Authorizing Provider  candesartan (ATACAND) 4 MG tablet Take 4 mg by mouth daily. 08/26/18   [provider]  carvedilol (COREG) 6.25 MG tablet Take 1 tablet (6.25 mg total) by mouth 2 (two) times daily. 03/26/19 04/25/19  Arrien, Jimmy Picket, MD  escitalopram (LEXAPRO) 5 MG tablet Take 1 tablet (5 mg total) by mouth daily.  03/27/19 04/26/19  Arrien, Jimmy Picket, MD  ezetimibe (ZETIA) 10 MG tablet Take 1 tablet (10 mg total) by mouth daily. 03/24/19 04/23/19  Arrien, Jimmy Picket, MD  levothyroxine (SYNTHROID) 75 MCG tablet Take 75 mcg by mouth daily. 08/10/18   [provider]  triamterene-hydrochlorothiazide (MAXZIDE-25) 37.5-25 MG tablet Take 1 tablet by mouth daily. 09/13/18   [provider]    Family History No family history on file.  Social History Social History   Tobacco Use  . Smoking status: Never Smoker  . Smokeless tobacco: Never Used  Substance Use Topics  . Alcohol use: Yes    Comment: social drinker, infrequent  . Drug use: Never     Allergies   Sinemet [carbidopa w-levodopa], Codeine, and Tetracyclines & related   Review of Systems Review of Systems  All other systems reviewed and are negative.    Physical Exam Updated Vital Signs BP (!) 151/85 (BP Location: Left Arm)   Pulse 60   Temp 99.2 F (37.3 C) (Rectal)   Resp 19   SpO2 97%   Physical Exam Vitals signs and nursing note reviewed.  Constitutional:      Appearance: She is well-developed. She is not diaphoretic.     Comments: Patient is an elderly female.  She is moaning and will not open her eyes.  HENT:     Head: Normocephalic and atraumatic.  Neck:     Musculoskeletal: Normal range of motion and neck supple.  Cardiovascular:     Rate and Rhythm: Normal rate and regular rhythm.     Heart sounds: No murmur. No friction rub. No gallop.   Pulmonary:     Effort: Pulmonary effort is normal. No respiratory distress.     Breath sounds: Normal breath sounds. No wheezing.  Abdominal:     General: Bowel sounds are normal. There is no distension.     Palpations: Abdomen is soft.     Tenderness: There is no abdominal tenderness.  Musculoskeletal: Normal range of motion.  Skin:    General: Skin is warm and dry.  Neurological:     Mental Status: She is alert and oriented to person, place, and  time.     Comments: Patient is awake and alert.  She moves all 4 extremities with purpose.      ED Treatments / Results  Labs (all labs ordered are listed, but only abnormal results are displayed) Labs Reviewed  COMPREHENSIVE METABOLIC PANEL  CBC WITH DIFFERENTIAL/PLATELET  ETHANOL  LIPASE, BLOOD  URINALYSIS, ROUTINE W REFLEX MICROSCOPIC  TROPONIN I (HIGH SENSITIVITY)    EKG EKG Interpretation  Date/Time:  Thursday March 31 2019 10:29:50 EST Ventricular Rate:  63 PR Interval:    QRS Duration: 86 QT Interval:  384 QTC Calculation: 393 R Axis:   0 Text Interpretation: Sinus rhythm Abnormal R-wave progression, early transition Confirmed by Geoffery Lyons ([id number]) on 03/31/2019 10:50:51 AM   Radiology No results found.  Procedures Procedures (including critical care time)  Medications Ordered in ED Medications - No data to display   Initial Impression / Assessment and Plan / ED Course  I have reviewed the triage vital signs and the nursing notes.  Pertinent labs & imaging results that were available during my care of the patient were reviewed by me and considered in my medical decision making (see chart for details).  Patient is a 73 year old female with past medical history of hypertension, depression, and recent admission for COVID-19.  She spent several days at [facility].  She was to go to rehab, but instead declined and ended up going back home.  After returning home for several days, she was doing better until this morning when she developed acute confusion and "hurting all over".  911 was called and the patient was transported here.  She initially arrived here very anxious and replying "I do not know" to every question asked of her.  She was tender in the chest and abdomen, but laboratory studies essentially unremarkable.  She did undergo CT scan of the head and abdomen/pelvis, both of which were unremarkable.  Urinalysis is clear.  I have found no  physical cause of her symptoms.  Results of the testing discussed with the patient's husband at length.  My plan was to discuss admission with the hospitalist.  I spoke with Dr. Dartha Lodge who would like to have the patient evaluated by psychiatry prior to medical consultation.  This is currently pending.    According to the patient's chart, it appears as though she had a similar episode while in the hospital for which no definitive cause was found and this was felt to likely be related to a psychogenic response to her illness and being in the hospital.  Patient awaiting TTS evaluation.  Final disposition will be determined once this has been completed.  Final Clinical Impressions(s) / ED Diagnoses   Final diagnoses:  None  ED Discharge Orders    None       Geoffery Lyonselo, Arwin Bisceglia, MD 03/31/19 [phone]

## 2019-03-31 NOTE — BHH Counselor (Signed)
TTS contacted [facility] ED to see patient via tele-psych. Per Lattie Haw from [facility] ED patient given Ativan and currently sedated. TTS will assess when alert.

## 2019-03-31 NOTE — BH Assessment (Signed)
[place] Assessment Progress Note   Clinician spoke with Talbot Grumbling, NP who recommended patient be observed at [facility] overnight and have psychiatry see her in AM.  Clinician talked with Dr. Eulis Foster who is in agreement with patient being seen by psychiatry in AM.

## 2019-03-31 NOTE — ED Triage Notes (Signed)
Pt husband states that patient was Covid+ end of October. Been running fevers, having confusion, talking to dead people over the past couple days.

## 2019-03-31 NOTE — ED Provider Notes (Signed)
She has been seen by TTS who have gotten collaborative information from her husband.  They plan on observing her overnight for evaluation by psychiatry in the morning.  Patient remained stable.  Clinical Course as of Mar 30 2138  Thu Mar 31, 2019  2137 Normal expect specific gravity high  Urinalysis, Routine w reflex microscopic(!) [EW]  2137 Normal  Ethanol [EW]  2137 Normal except hemoglobin low  CBC with Differential(!) [EW]  2137 Normal except potassium low, glucose high, total protein low, albumin low  Comprehensive metabolic panel(!) [EW]  0086 Normal  Lipase, blood [EW]  2137 Normal  Troponin I (High Sensitivity) [EW]  2138 Consistent with viral pneumonia  DG Chest Port 1 View [EW]  2138 CT scan head and cervical spine, per radiologist no acute abnormalities.   [EW]    Clinical Course User Index [EW] Daleen Bo, MD     Patient Vitals for the past 24 hrs:  BP Temp Temp src Pulse Resp SpO2  03/31/19 2100 114/68 - - 64 17 99 %  03/31/19 1700 140/75 - - 75 (!) 24 100 %  03/31/19 1630 134/73 - - (!) 56 17 100 %  03/31/19 1600 136/70 - - (!) 54 15 100 %  03/31/19 1530 140/71 - - (!) 58 16 100 %  03/31/19 1500 131/79 - - (!) 57 17 100 %  03/31/19 1430 134/71 - - (!) 59 20 100 %  03/31/19 1415 (!) 152/87 - - 66 15 100 %  03/31/19 1400 (!) 143/75 - - 60 20 100 %  03/31/19 1315 (!) 152/68 - - 64 (!) 22 100 %  03/31/19 1245 126/70 - - 63 (!) 22 100 %  03/31/19 1215 114/74 - - 63 (!) 23 100 %  03/31/19 1200 120/71 - - 62 (!) 25 100 %  03/31/19 1130 116/69 - - 62 (!) 22 99 %  03/31/19 1100 (!) 146/81 - - - 16 -  03/31/19 1026 (!) 151/85 99.2 F (37.3 C) Rectal 60 19 97 %    TTS Consult  Medical Decision Making: Nonspecific intermittent confusion with delusions.  Screening for toxic metabolic and encephalitis abnormalities are negative.  Consult with psychiatry they will evaluate her in the morning.  Recent Covid infection.  This does not appear to be Covid  encephalopathy.  Nalina Yeatman was evaluated in Emergency Department on 03/31/2019 for the symptoms described in the history of present illness. She was evaluated in the context of the global COVID-19 pandemic, which necessitated consideration that the patient might be at risk for infection with the SARS-CoV-2 virus that causes COVID-19. Institutional protocols and algorithms that pertain to the evaluation of patients at risk for COVID-19 are in a state of rapid change based on information released by regulatory bodies including the CDC and federal and state organizations. These policies and algorithms were followed during the patient's care in the ED.  CRITICAL CARE-no Performed by: Rosana Hoes, MD 03/31/19 2140

## 2019-04-01 ENCOUNTER — Other Ambulatory Visit: Payer: Self-pay

## 2019-04-01 ENCOUNTER — Emergency Department (HOSPITAL_COMMUNITY): Payer: Medicare Other

## 2019-04-01 ENCOUNTER — Emergency Department (HOSPITAL_COMMUNITY)
Admission: EM | Admit: 2019-04-01 | Discharge: 2019-04-02 | Disposition: A | Discharge status: Home / Self Care | Payer: Medicare Other | Source: Home / Self Care | Attending: Emergency Medicine | Admitting: Emergency Medicine

## 2019-04-01 DIAGNOSIS — I10 Essential (primary) hypertension: Secondary | ICD-10-CM | POA: Insufficient documentation

## 2019-04-01 DIAGNOSIS — R0602 Shortness of breath: Secondary | ICD-10-CM

## 2019-04-01 DIAGNOSIS — R6883 Chills (without fever): Secondary | ICD-10-CM | POA: Insufficient documentation

## 2019-04-01 DIAGNOSIS — Z79899 Other long term (current) drug therapy: Secondary | ICD-10-CM | POA: Insufficient documentation

## 2019-04-01 DIAGNOSIS — U071 COVID-19: Secondary | ICD-10-CM | POA: Insufficient documentation

## 2019-04-01 LAB — COMPREHENSIVE METABOLIC PANEL
ALT: 34 U/L (ref 0–44)
AST: 19 U/L (ref 15–41)
Albumin: 3.4 g/dL — ABNORMAL LOW (ref 3.5–5.0)
Alkaline Phosphatase: 92 U/L (ref 38–126)
Anion gap: 9 (ref 5–15)
BUN: 25 mg/dL — ABNORMAL HIGH (ref 8–23)
CO2: 28 mmol/L (ref 22–32)
Calcium: 9.4 mg/dL (ref 8.9–10.3)
Chloride: 101 mmol/L (ref 98–111)
Creatinine, Ser: 0.98 mg/dL (ref 0.44–1.00)
GFR calc Af Amer: >60 mL/min (ref >60)
GFR calc non Af Amer: 57 mL/min — ABNORMAL LOW (ref >60)
Glucose, Bld: 112 mg/dL — ABNORMAL HIGH (ref 70–99)
Potassium: 3.8 mmol/L (ref 3.5–5.1)
Sodium: 138 mmol/L (ref 135–145)
Total Bilirubin: 0.4 mg/dL (ref 0.3–1.2)
Total Protein: 7 g/dL (ref 6.5–8.1)

## 2019-04-01 LAB — URINALYSIS, ROUTINE W REFLEX MICROSCOPIC
Bilirubin Urine: NEGATIVE
Glucose, UA: NEGATIVE mg/dL
Hgb urine dipstick: NEGATIVE
Ketones, ur: NEGATIVE mg/dL
Nitrite: NEGATIVE
Protein, ur: NEGATIVE mg/dL
Specific Gravity, Urine: 1.016 (ref 1.005–1.030)
pH: 6 (ref 5.0–8.0)

## 2019-04-01 LAB — CBC WITH DIFFERENTIAL/PLATELET
Abs Immature Granulocytes: 0.02 10*3/uL (ref 0.00–0.07)
Basophils Absolute: 0 10*3/uL (ref 0.0–0.1)
Basophils Relative: 1 %
Eosinophils Absolute: 0.3 10*3/uL (ref 0.0–0.5)
Eosinophils Relative: 5 %
HCT: 39.2 % (ref 36.0–46.0)
Hemoglobin: 12.4 g/dL (ref 12.0–15.0)
Immature Granulocytes: 0 %
Lymphocytes Relative: 26 %
Lymphs Abs: 1.4 10*3/uL (ref 0.7–4.0)
MCH: 31.9 pg (ref 26.0–34.0)
MCHC: 31.6 g/dL (ref 30.0–36.0)
MCV: 100.8 fL — ABNORMAL HIGH (ref 80.0–100.0)
Monocytes Absolute: 0.6 10*3/uL (ref 0.1–1.0)
Monocytes Relative: 11 %
Neutro Abs: 3 10*3/uL (ref 1.7–7.7)
Neutrophils Relative %: 57 %
Platelets: 274 10*3/uL (ref 150–400)
RBC: 3.89 MIL/uL (ref 3.87–5.11)
RDW: 13.4 % (ref 11.5–15.5)
WBC: 5.3 10*3/uL (ref 4.0–10.5)
nRBC: 0 % (ref 0.0–0.2)

## 2019-04-01 LAB — SARS CORONAVIRUS 2 (TAT 6-24 HRS): SARS Coronavirus 2: POSITIVE — AB

## 2019-04-01 LAB — TROPONIN I (HIGH SENSITIVITY): Troponin I (High Sensitivity): 3 ng/L (ref <18)

## 2019-04-01 LAB — LACTIC ACID, PLASMA: Lactic Acid, Venous: 1.6 mmol/L (ref 0.5–1.9)

## 2019-04-01 IMAGING — DX DG CHEST 1V PORT
1 series · 1 of 1 positions shown · non-contrast
Comparison: [DATE]

CLINICAL DATA: COVID positive. Chest pain, shortness of breath,
cough

EXAM:
PORTABLE CHEST 1 VIEW

[chest ap]
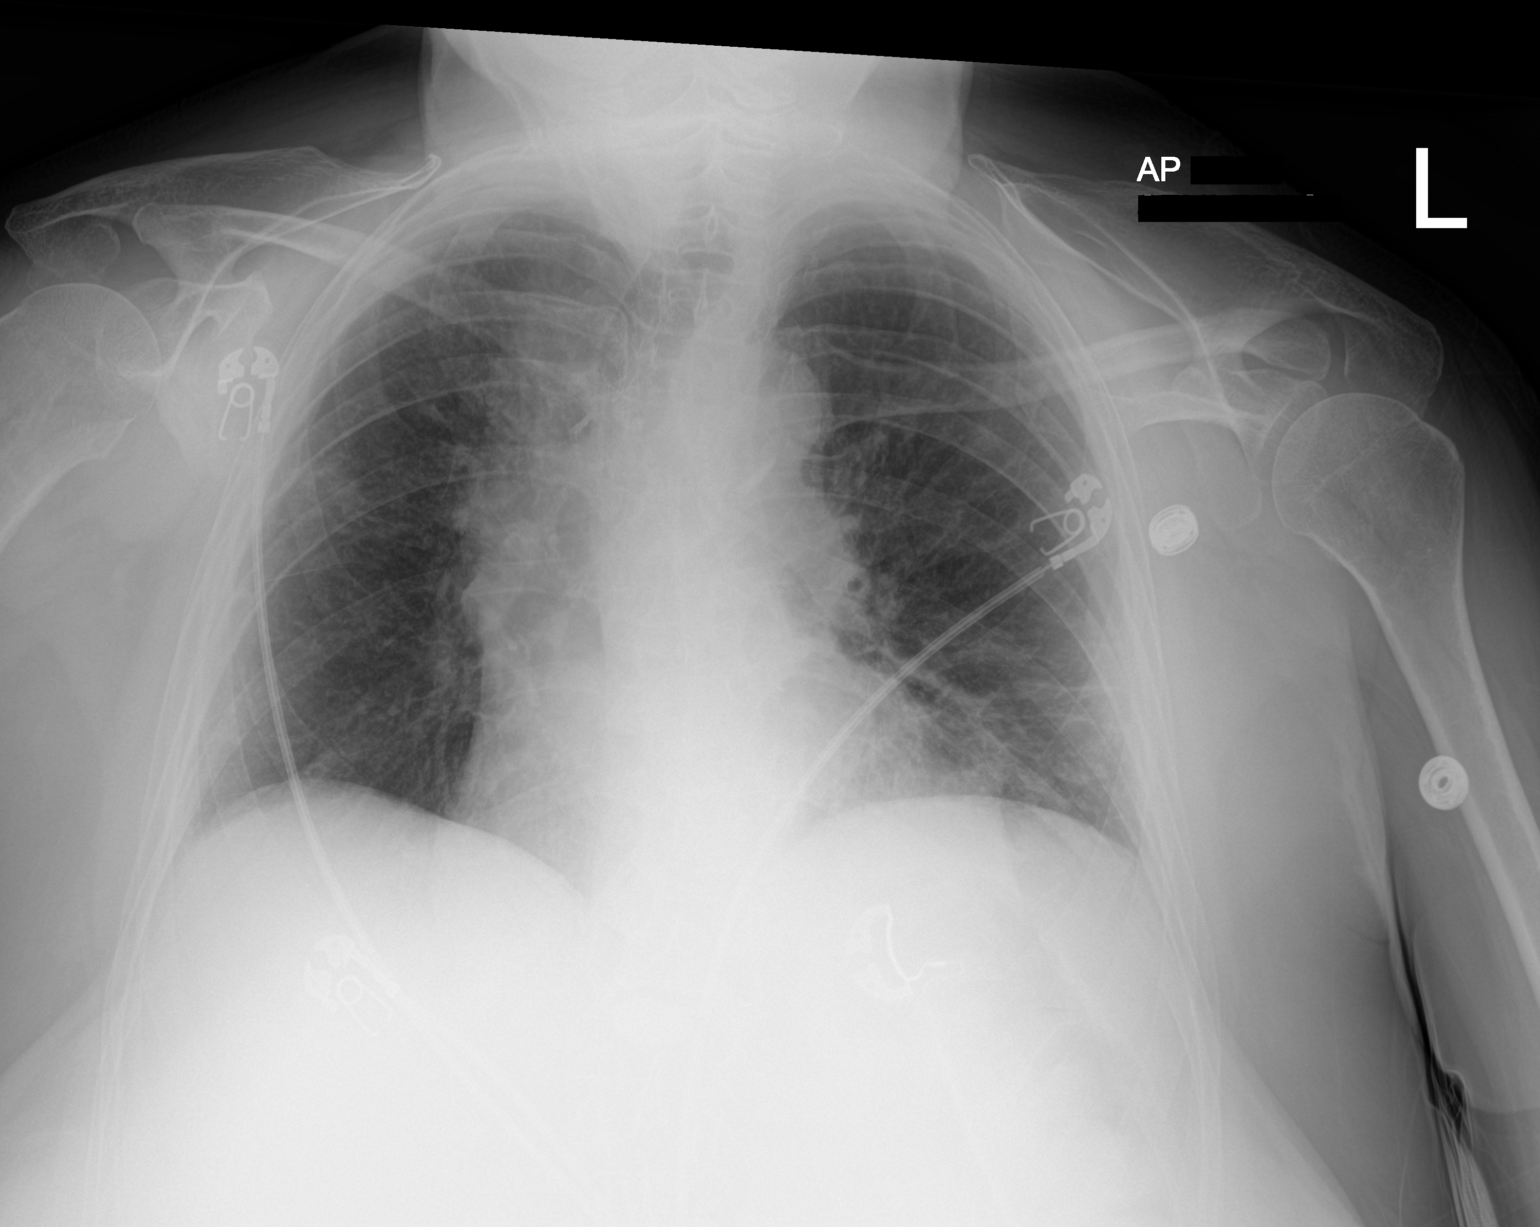

[1 of 1 positions shown; findings below may reference images not displayed]

FINDINGS: Patchy airspace disease in the right upper lobe and left lower lobe
have improved. Mild residual opacities remain in both areas, most
pronounced at the left base. No effusions. Heart is borderline in
size. No acute bony abnormality.
IMPRESSION: Improving bilateral airspace opacities.

## 2019-04-01 NOTE — TOC Initial Note (Signed)
Transition of Care [facility]) - Initial/Assessment Note    Patient Details  Name: Silveria Botz MRN: [id number] Date of Birth: [birth year]  [facility]) CM/SW Contact:    Erenest Rasher, RN Phone Number: [phone]  04/01/2019, 1:31 PM  Clinical Narrative:                 Spoke to pt's husband, Timmothy Sours. States pt is active with [facility] for [facility] [place]. States he needs additional aide to assist with her care. States he has a Network engineer and they are scheduled to come out next week for initial assessment. He states that his [facility] Medicare with assist with paying for custodial care up to 35 hours per week. Explained he will need to follow up with the plan as CM/CSW only arrange [facility] services. Contacted [facility] for resumption of HH. Pt has needed DME in the home. He will reach out to private agency that provided aide services, understands he may initially have to pay out of pocket.   Expected Discharge Plan: [place] Barriers to Discharge: No Barriers Identified   Patient Goals and CMS Choice Patient states their goals for this hospitalization and ongoing recovery are:: needs additional help at home CMS Medicare.gov Compare Post Acute Care list provided to:: Patient Represenative (must comment)(hsuband Carleene Overlie) Choice offered to / list presented to : Spouse  Expected Discharge Plan and Services Expected Discharge Plan: [place]   Discharge Planning Services: CM Consult Post Acute Care Choice: [place] arrangements for the past 2 months: [place] Expected Discharge Date: 04/01/19                         HH Arranged: RN, PT, OT, Nurse's Aide, Social Work [employer] Agency: [place] Date [facility] Agency Contacted: 04/01/19 Time [place]: 1326 Representative spoke with at [place]: [place] Arrangements/Services Living arrangements for the past 2 months: [place]  with:: Spouse Patient language and need for interpreter reviewed:: Yes Do you feel safe going back to the place where you live?: Yes      Need for Family Participation in Patient Care: Yes (Comment) Care giver support system in place?: Yes (comment) Current home services: DME, Home OT, Home PT, Home RN, Homehealth aide Criminal Activity/Legal Involvement Pertinent to Current Situation/Hospitalization: No - Comment as needed  Activities of Daily Living   ADL Screening (condition at time of admission) Patient's cognitive ability adequate to safely complete daily activities?: Yes Is the patient deaf or have difficulty hearing?: No Does the patient have difficulty seeing, even when wearing glasses/contacts?: No Does the patient have difficulty concentrating, remembering, or making decisions?: Yes Patient able to express need for assistance with ADLs?: Yes Does the patient have difficulty dressing or bathing?: Yes Communication: Independent Dressing (OT): Independent(Uses the side of the bed to dress.) Is this a change from baseline?: Pre-admission baseline Grooming: Independent(Takes extra time.) Is this a change from baseline?: Pre-admission baseline Feeding: Needs assistance(Others must prepare foods.) Is this a change from baseline?: Pre-admission baseline Bathing: Needs assistance(Bird baths at sink.) Is this a change from baseline?: Pre-admission baseline Toileting: Needs assistance(Cannot pivot to sit in "potty chair.") Is this a change from baseline?: Pre-admission baseline In/Out Bed: Independent with device (comment) Is this a change from baseline?: Pre-admission baseline Walks in Home: Dependent(Uses a wheelchair) Does the patient have difficulty walking  or climbing stairs?: Yes(Uses a wheelchair) Weakness of Legs: Both(Uses a wheelchair for mobility.) Weakness of Arms/Hands: Both(Grip is poor.)  Permission Sought/Granted Permission sought to share information with : Case  Manager Permission granted to share information with : Yes, Verbal Permission Granted  Share Information with NAME: Joni Colegrove  Permission granted to share info w AGENCY: [facility]  Permission granted to share info w Relationship: husband  Permission granted to share info w Contact Information: [phone]  Emotional Assessment           Psych Involvement: Yes (comment)  Admission diagnosis:  fever/cough/AMS  Patient Active Problem List   Diagnosis Date Noted  . Major depressive disorder, recurrent episode, moderate (HCC) 03/21/2019  . Generalized weakness 03/16/2019  . Thyroid disease   . Dystonia   . Pneumonia due to COVID-19 virus   . Elevated transaminase level   . Hypertension   . Chest pain   . COVID-19 virus infection    PCP:  Laqueta Due., MD Pharmacy:   [facility], [place] - [address] [place] [address] Phone: [phone] Fax: [phone]     Social Determinants of Health (SDOH) Interventions    Readmission Risk Interventions No flowsheet data found.

## 2019-04-01 NOTE — ED Notes (Signed)
Called back patient's husband, Katlin Bortner, and gave him updates on patient.

## 2019-04-01 NOTE — BH Assessment (Signed)
[place] Assessment Progress Note  Per Letitia Libra, FNP, this pt does not require psychiatric hospitalization at this time.  Pt is to be discharged from [facility].  No behavioral health referrals are indicated for this pt at this time.  Discharge instructions advise pt to follow up with her current outpatient providers.  Pt's nurse has been notified.  Jalene Mullet, [place] Triage Specialist [phone]

## 2019-04-01 NOTE — Consult Note (Signed)
[facility]  04/01/2019 11:58 AM Debra CassisSusan Morgan  MRN:  [id number] Principal Problem: Major depressive disorder, recurrent episode, moderate (HCC) Discharge Diagnoses: Principal Problem:   Major depressive disorder, recurrent episode, moderate (HCC)   Subjective: Patient complains of generalized body pain on yesterday. Patient reports feeling tremulous yesterday with body aches, decided to call 911 for assistance. Patient has had similar episodes in past, states "I am not sure what happens to me when I get like that." Patient verbalizes concerns with physical health including "I have not driven in several years, I have neuropathy and I can't feel my feel, I cannot walk, I think this could be Parkinson's disease."  Patient denies suicidal and homicidal ideations. Patient has history of anxiety. Patient endorses visual hallucinations, " occasionally I see my mother." Patient denies auditory hallucinations, denies command hallucinations.  Patient lives with husband, one adult daughter, [place] in [place].  Patient assessed along with Dr Lucianne MussKumar, cleared by psychiatry.  Total Time spent with patient: 20 minutes  Past Psychiatric History:   Past Medical History:  Past Medical History:  Diagnosis Date  . Dystonia   . High cholesterol   . Hypertension   . Thyroid disease     Past Surgical History:  Procedure Laterality Date  . TUBAL LIGATION     Family History: No family history on file. Family Psychiatric  History: Denies Social History:  Social History   Substance and Sexual Activity  Alcohol Use Yes   Comment: social drinker, infrequent     Social History   Substance and Sexual Activity  Drug Use Never    Social History   Socioeconomic History  . Marital status: Married    Spouse name: Not on file  . Number of children: Not on file  . Years of education: Not on file  . Highest education level: Not on file  Occupational History  . Not on file  Social Needs  .  Financial resource strain: Not on file  . Food insecurity    Worry: Not on file    Inability: Not on file  . Transportation needs    Medical: Not on file    Non-medical: Not on file  Tobacco Use  . Smoking status: Never Smoker  . Smokeless tobacco: Never Used  Substance and Sexual Activity  . Alcohol use: Yes    Comment: social drinker, infrequent  . Drug use: Never  . Sexual activity: Not on file  Lifestyle  . Physical activity    Days per week: Not on file    Minutes per session: Not on file  . Stress: Not on file  Relationships  . Social Musicianconnections    Talks on phone: Not on file    Gets together: Not on file    Attends religious service: Not on file    Active member of club or organization: Not on file    Attends meetings of clubs or organizations: Not on file    Relationship status: Not on file  Other Topics Concern  . Not on file  Social History Narrative  . Not on file    Has this patient used any form of tobacco in the last 30 days? (Cigarettes, Smokeless Tobacco, Cigars, and/or Pipes) A prescription for an FDA-approved tobacco cessation medication was offered at discharge and the patient refused  Current Medications: Current Facility-Administered Medications  Medication Dose Route Frequency Provider Last Rate Last Dose  . carvedilol (COREG) tablet 6.25 mg  6.25 mg Oral BID Mancel BaleWentz, Elliott,  MD   6.25 mg at 04/01/19 1131  . escitalopram (LEXAPRO) tablet 5 mg  5 mg Oral Daily Daleen Bo, MD   5 mg at 04/01/19 1127  . ezetimibe (ZETIA) tablet 10 mg  10 mg Oral Daily Daleen Bo, MD   10 mg at 04/01/19 1129  . irbesartan (AVAPRO) tablet 37.5 mg  37.5 mg Oral Daily Daleen Bo, MD   37.5 mg at 04/01/19 1130  . levothyroxine (SYNTHROID) tablet 75 mcg  75 mcg Oral Daily Daleen Bo, MD   75 mcg at 04/01/19 0005  . triamterene-hydrochlorothiazide (MAXZIDE-25) 37.5-25 MG per tablet 1 tablet  1 tablet Oral Daily Daleen Bo, MD   1 tablet at 04/01/19 1132    Current Outpatient Medications  Medication Sig Dispense Refill  . candesartan (ATACAND) 4 MG tablet Take 4 mg by mouth daily.    . carvedilol (COREG) 6.25 MG tablet Take 1 tablet (6.25 mg total) by mouth 2 (two) times daily. 60 tablet 0  . escitalopram (LEXAPRO) 5 MG tablet Take 1 tablet (5 mg total) by mouth daily. 30 tablet 0  . ezetimibe (ZETIA) 10 MG tablet Take 1 tablet (10 mg total) by mouth daily. 30 tablet 0  . levothyroxine (SYNTHROID) 75 MCG tablet Take 75 mcg by mouth daily.    Marland Kitchen triamterene-hydrochlorothiazide (MAXZIDE-25) 37.5-25 MG tablet Take 1 tablet by mouth daily.     PTA Medications: (Not in a hospital admission)   Musculoskeletal: Strength & Muscle Tone: unable to assess Gait & Station: unable to assess Patient leans: unable to assess  Psychiatric Specialty Exam: Physical Exam  Nursing note and vitals reviewed. Constitutional: She is oriented to person, place, and time. She appears well-developed.  HENT:  Head: Normocephalic.  Cardiovascular: Normal rate.  Respiratory: Effort normal.  Neurological: She is alert and oriented to person, place, and time.  Psychiatric: She has a normal mood and affect. Her behavior is normal. Judgment and thought content normal.    Review of Systems  Constitutional: Negative.   HENT: Negative.   Eyes: Negative.   Respiratory: Negative.   Cardiovascular: Negative.   Gastrointestinal: Negative.   Genitourinary: Negative.   Musculoskeletal: Negative.   Skin: Negative.   Neurological: Negative.   Endo/Heme/Allergies: Negative.     Blood pressure 106/62, pulse 66, temperature 99.2 F (37.3 C), temperature source Rectal, resp. rate 14, SpO2 96 %.There is no height or weight on file to calculate BMI.  General Appearance: Casual  Eye Contact:  Good  Speech:  Clear and Coherent and Normal Rate  Volume:  Normal  Mood:  Depressed  Affect:  Appropriate and Congruent  Thought Process:  Coherent, Goal Directed and Descriptions  of Associations: Intact  Orientation:  Full (Time, Place, and Person)  Thought Content:  WDL and Logical  Suicidal Thoughts:  No  Homicidal Thoughts:  No  Memory:  Immediate;   Good Recent;   Good Remote;   Good  Judgement:  Good  Insight:  Fair  Psychomotor Activity:  Normal  Concentration:  Concentration: Good and Attention Span: Good  Recall:  Good  Fund of Knowledge:  Good  Language:  Good  Akathisia:  No  Handed:  Right  AIMS (if indicated):     Assets:  Communication Skills Desire for Improvement Financial Resources/Insurance Housing Social Support  ADL's:  Intact  Cognition:  WNL  Sleep:        Demographic Factors:  Caucasian  Loss Factors: NA  Historical Factors: NA  Risk Reduction Factors:  Sense of responsibility to family, Living with another person, especially a relative, Positive social support, Positive therapeutic relationship and Positive coping skills or problem solving skills  Continued Clinical Symptoms:  Medical Diagnoses and Treatments/Surgeries  Cognitive Features That Contribute To Risk:  None    Suicide Risk:  Minimal: No identifiable suicidal ideation.  Patients presenting with no risk factors but with morbid ruminations; may be classified as minimal risk based on the severity of the depressive symptoms    Plan Of Care/Follow-up recommendations: Follow up with outpatient psychiatry.  Disposition: Discharge home. Patrcia Dolly, FNP 04/01/2019, 11:58 AM

## 2019-04-01 NOTE — ED Provider Notes (Signed)
[facility]-EMERGENCY DEPT Provider Note   CSN: [id number] Arrival date & time: 04/01/19  1816     History   Chief Complaint Chief Complaint  Patient presents with  . Covid +  . Shortness of Breath    HPI Debra Morgan is a 73 y.o. female.     The history is provided by the patient and medical records. No language interpreter was used.  Cough Cough characteristics:  Non-productive Severity:  Moderate Onset quality:  Gradual Duration:  2 weeks Timing:  Constant Progression:  Unchanged Chronicity:  New Relieved by:  Nothing Worsened by:  Nothing Ineffective treatments:  None tried Associated symptoms: chills and shortness of breath   Associated symptoms: no chest pain, no diaphoresis, no fever and no rash   Risk factors: recent infection     Past Medical History:  Diagnosis Date  . Dystonia   . High cholesterol   . Hypertension   . Thyroid disease     Patient Active Problem List   Diagnosis Date Noted  . Major depressive disorder, recurrent episode, moderate (HCC) 03/21/2019  . Generalized weakness 03/16/2019  . Thyroid disease   . Dystonia   . Pneumonia due to COVID-19 virus   . Elevated transaminase level   . Hypertension   . Chest pain   . COVID-19 virus infection     Past Surgical History:  Procedure Laterality Date  . TUBAL LIGATION       OB History   No obstetric history on file.      Home Medications    Prior to Admission medications   Medication Sig Start Date End Date Taking? Authorizing Provider  candesartan (ATACAND) 4 MG tablet Take 4 mg by mouth daily. 08/26/18   [provider]  carvedilol (COREG) 6.25 MG tablet Take 1 tablet (6.25 mg total) by mouth 2 (two) times daily. 03/26/19 04/25/19  Arrien, York Ram, MD  escitalopram (LEXAPRO) 5 MG tablet Take 1 tablet (5 mg total) by mouth daily. 03/27/19 04/26/19  Arrien, York Ram, MD  ezetimibe (ZETIA) 10 MG tablet Take 1 tablet (10 mg total) by  mouth daily. 03/24/19 04/23/19  Arrien, York Ram, MD  levothyroxine (SYNTHROID) 75 MCG tablet Take 75 mcg by mouth daily. 08/10/18   [provider]  triamterene-hydrochlorothiazide (MAXZIDE-25) 37.5-25 MG tablet Take 1 tablet by mouth daily. 09/13/18   [provider]    Family History No family history on file.  Social History Social History   Tobacco Use  . Smoking status: Never Smoker  . Smokeless tobacco: Never Used  Substance Use Topics  . Alcohol use: Yes    Comment: social drinker, infrequent  . Drug use: Never     Allergies   Sinemet [carbidopa w-levodopa], Codeine, and Tetracyclines & related   Review of Systems Review of Systems  Constitutional: Positive for chills and fatigue. Negative for diaphoresis and fever.  HENT: Negative for congestion.   Eyes: Negative for visual disturbance.  Respiratory: Positive for cough and shortness of breath. Negative for chest tightness.   Cardiovascular: Negative for chest pain.  Gastrointestinal: Negative for abdominal pain, constipation, diarrhea, nausea and vomiting.  Genitourinary: Negative for flank pain.  Musculoskeletal: Negative for back pain, neck pain and neck stiffness.  Skin: Negative for rash.  Psychiatric/Behavioral: Positive for confusion. Negative for agitation.  All other systems reviewed and are negative.    Physical Exam Updated Vital Signs BP 108/80 (BP Location: Left Arm)   Pulse 71   Temp 98.1 F (  36.7 C) (Oral)   Resp 16   Ht 5\' 4"  (1.626 m)   Wt 75.6 kg   SpO2 97%   BMI 28.61 kg/m   Physical Exam Vitals signs and nursing note reviewed.  Constitutional:      General: She is not in acute distress.    Appearance: She is well-developed. She is not ill-appearing, toxic-appearing or diaphoretic.  HENT:     Head: Normocephalic and atraumatic.     Right Ear: External ear normal.     Left Ear: External ear normal.     Nose: Nose normal.     Mouth/Throat:     Pharynx: No  oropharyngeal exudate.  Eyes:     Conjunctiva/sclera: Conjunctivae normal.     Pupils: Pupils are equal, round, and reactive to light.  Neck:     Musculoskeletal: Normal range of motion and neck supple.  Cardiovascular:     Rate and Rhythm: Normal rate.  Pulmonary:     Effort: No respiratory distress.     Breath sounds: No stridor. Rhonchi (mild) present. No decreased breath sounds, wheezing or rales.  Abdominal:     General: There is no distension.     Tenderness: There is no abdominal tenderness. There is no rebound.  Musculoskeletal:     Right lower leg: She exhibits no tenderness. No edema.     Left lower leg: She exhibits no tenderness. No edema.  Skin:    General: Skin is warm.     Findings: No erythema or rash.  Neurological:     General: No focal deficit present.     Mental Status: She is alert. She is disoriented.     GCS: GCS eye subscore is 4. GCS verbal subscore is 5. GCS motor subscore is 6.     Cranial Nerves: No dysarthria or facial asymmetry.     Sensory: Sensation is intact. No sensory deficit.     Motor: Motor function is intact. No weakness, tremor or abnormal muscle tone.     Coordination: Coordination normal.     Deep Tendon Reflexes: Reflexes are normal and symmetric.  Psychiatric:        Mood and Affect: Mood normal.      ED Treatments / Results  Labs (all labs ordered are listed, but only abnormal results are displayed) Labs Reviewed  CBC WITH DIFFERENTIAL/PLATELET - Abnormal; Notable for the following components:      Result Value   MCV 100.8 (*)    All other components within normal limits  COMPREHENSIVE METABOLIC PANEL - Abnormal; Notable for the following components:   Glucose, Bld 112 (*)    BUN 25 (*)    Albumin 3.4 (*)    GFR calc non Af Amer 57 (*)    All other components within normal limits  URINALYSIS, ROUTINE W REFLEX MICROSCOPIC - Abnormal; Notable for the following components:   APPearance HAZY (*)    Leukocytes,Ua MODERATE (*)     Bacteria, UA RARE (*)    All other components within normal limits  URINE CULTURE  LACTIC ACID, PLASMA  LACTIC ACID, PLASMA  TROPONIN I (HIGH SENSITIVITY)  TROPONIN I (HIGH SENSITIVITY)    EKG EKG Interpretation  Date/Time:  Friday April 01 2019 20:17:59 EST Ventricular Rate:  66 PR Interval:    QRS Duration: 106 QT Interval:  399 QTC Calculation: 418 R Axis:   30 Text Interpretation: Sinus rhythm Atrial premature complex Consider left atrial enlargement Abnormal R-wave progression, early transition When compared to prior,  no significant cahgnes seen. No STEMI Confirmed by Antony Blackbird [phone]) on 04/02/2019 12:23:17 AM   Radiology Ct Head Wo Contrast  Result Date: 03/31/2019 CLINICAL DATA:  Altered mental status EXAM: CT HEAD WITHOUT CONTRAST TECHNIQUE: Contiguous axial images were obtained from the base of the skull through the vertex without intravenous contrast. COMPARISON:  03/25/2019 FINDINGS: Brain: No evidence of acute infarction, hemorrhage, hydrocephalus, extra-axial collection or mass lesion/mass effect. Periventricular and deep white matter hypodensity. Vascular: No hyperdense vessel or unexpected calcification. Skull: Normal. Negative for fracture or focal lesion. Sinuses/Orbits: No acute finding. Other: None. IMPRESSION: No acute intracranial pathology.  Small-vessel white matter disease. Electronically Signed   By: Eddie Candle M.D.   On: 03/31/2019 13:52   Ct Abdomen Pelvis W Contrast  Result Date: 03/31/2019 CLINICAL DATA:  Generalized abdominal pain.  Fever and confusion. EXAM: CT ABDOMEN AND PELVIS WITH CONTRAST TECHNIQUE: Multidetector CT imaging of the abdomen and pelvis was performed using the standard protocol following bolus administration of intravenous contrast. CONTRAST:  129mL OMNIPAQUE IOHEXOL 300 MG/ML  SOLN COMPARISON:  Chest CT 03/16/2019 FINDINGS: Lower chest: Patchy E bibasilar infiltrates and scattered small pulmonary nodules. No pleural  effusions. The heart is borderline enlarged. No pericardial effusion. Coronary artery and aortic calcifications are noted. Hepatobiliary: No focal hepatic lesions or intrahepatic biliary dilatation. The gallbladder appears normal. No common bile duct dilatation. Pancreas: No mass, inflammation or ductal dilatation. Spleen: Normal size.  No focal lesions. Adrenals/Urinary Tract: The adrenal glands are normal. No renal, ureteral or bladder calculi or mass. Mild distention of the bladder. Stomach/Bowel: The stomach, duodenum, small bowel and colon are unremarkable. No acute inflammatory changes, mass lesions or obstructive findings. The terminal ileum and appendix are normal. Vascular/Lymphatic: Moderate atherosclerotic calcifications involving the aorta and branch vessels but no aneurysm. The major venous structures are patent. No mesenteric or retroperitoneal mass or adenopathy. Small scattered lymph nodes are noted. Reproductive: Uterine fibroids are noted. The endometrium is borderline measuring 7.5 mm. Follow-up pelvic ultrasound examination is suggested. Both ovaries appear normal. Other: No free pelvic fluid collections. No inguinal mass or adenopathy. Musculoskeletal: No significant bony findings. Vertebral augmentation changes are noted at L1 with moderate retropulsion. Osteoporosis is noted. IMPRESSION: 1. No acute abdominal/pelvic findings, mass lesions or adenopathy. 2. Patchy bilateral lower lobe infiltrates and a few small pulmonary nodules. This appears slightly progressive when compared to the prior CT scan where most of the infiltrates or upper lobe predominant. The small nodules are likely inflammatory. 3. Slightly prominent endometrium for age. Recommend follow-up pelvic ultrasound examination. Uterine fibroids are noted. The ovaries are normal. 4. Stable vertebral augmentation changes at L1. Electronically Signed   By: Marijo Sanes M.D.   On: 03/31/2019 14:04   Dg Chest Portable 1 View  Result  Date: 04/01/2019 CLINICAL DATA:  COVID positive. Chest pain, shortness of breath, cough EXAM: PORTABLE CHEST 1 VIEW COMPARISON:  03/31/2019 FINDINGS: Patchy airspace disease in the right upper lobe and left lower lobe have improved. Mild residual opacities remain in both areas, most pronounced at the left base. No effusions. Heart is borderline in size. No acute bony abnormality. IMPRESSION: Improving bilateral airspace opacities. Electronically Signed   By: Rolm Baptise M.D.   On: 04/01/2019 20:17   Dg Chest Port 1 View  Result Date: 03/31/2019 CLINICAL DATA:  Altered level of consciousness and weakness. Coronavirus infection. EXAM: PORTABLE CHEST 1 VIEW COMPARISON:  03/29/2019 FINDINGS: Heart size is normal. Chronic aortic atherosclerosis. Patchy bilateral perihilar pulmonary density could  represent sequela of viral pneumonia. No evidence of dense consolidation or lobar collapse. No visible effusion. No change since the study of 2 days ago. IMPRESSION: Patchy perihilar density could represent viral pneumonia. No significant change since 2 days ago. Electronically Signed   By: Paulina Fusi M.D.   On: 03/31/2019 14:43    Procedures Procedures (including critical care time)  Medications Ordered in ED Medications - No data to display   Kimiyo Carmicheal was evaluated in Emergency Department on 04/02/2019 for the symptoms described in the history of present illness. She was evaluated in the context of the global COVID-19 pandemic, which necessitated consideration that the patient might be at risk for infection with the SARS-CoV-2 virus that causes COVID-19. Institutional protocols and algorithms that pertain to the evaluation of patients at risk for COVID-19 are in a state of rapid change based on information released by regulatory bodies including the CDC and federal and state organizations. These policies and algorithms were followed during the patient's care in the ED.   Initial Impression / Assessment  and Plan / ED Course  I have reviewed the triage vital signs and the nursing notes.  Pertinent labs & imaging results that were available during my care of the patient were reviewed by me and considered in my medical decision making (see chart for details).        Debra Morgan is a 73 y.o. female with a past medical history significant for hypertension, depression, dystonia with gait abnormalities at baseline, thyroid disease, and recent  admit for COVID-19 viral pneumonia who presents with continued chest discomfort shortness of breath and confusion.  She says that she was seen yesterday and was observed overnight for altered mental status and confusion and after both medical clearance and psychiatric clearance today, she was at home.  She was called back today saying that she was Covid positive and due to her continued shortness of breath, chest tightness, chest discomfort, and the confusion, the on-call callback team recommended she come back to emergency part for evaluation.  She reports that the symptoms have been similar over the last several days but they are still persistent.  She reports she still short of breath although her vital signs on arrival are reassuring with no hypoxia on room air.   On exam, patient does have rhonchi bilaterally.  Chest is nontender back is nontender and she has normal sensation in extremities.  She is good pulses in extremities.  She has symmetric strength in her legs but reports she has the chronic dystonia in her feet.  Abdomen nontender.  She is alert and oriented x4 and does not seem confused on exam although she does report she is having some slight confusion.  Patient reports that if she was not called back today, she is unsure if she would have returned as her symptoms remained stable throughout the day however on my history and exam, she is complaining of continued moderate chest pressure, shortness of breath, continued cough, and chart review shows that she  has had continued opacity in her lung.  We together agreed in a shared decision made conversation to do repeat chest x-ray and labs and monitor the patient's oxygen saturations.  If patient has any worsening work-up and she still is feeling confused, would consider admission however if she remains with normal oxygen saturations and no more concerning work-up findings, anticipate discharge home.  1:26 AM Patient's work-up is all improved from prior.  X-ray now shows resolving opacities.  Patient  has no leukocytosis or anemia.  Lactic acid normal x2 and troponin is normal x2.  Patient's vital signs were reassuring and her oxygen saturations remained above 96% on room air the entire stay.  Patient is not tachycardic and is no longer tachypneic.  Patient resting comfortably and is alert and oriented x4.  She does not appear confused.  After extensive conversation, we do not feel there is evidence that patient needs to be made at this time.  Patient is agreeable this plan for going home.  She will stay hydrated and follow-up with her PCP.  She understands return precautions and was discharged in good condition.   Final Clinical Impressions(s) / ED Diagnoses   Final diagnoses:  SOB (shortness of breath)  COVID-19    ED Discharge Orders    None     Clinical Impression: 1. SOB (shortness of breath)   2. COVID-19     Disposition: Discharge  Condition: Good  I have discussed the results, Dx and Tx plan with the pt(& family if present). He/she/they expressed understanding and agree(s) with the plan. Discharge instructions discussed at great length. Strict return precautions discussed and pt &/or family have verbalized understanding of the instructions. No further questions at time of discharge.    New Prescriptions   No medications on file    Follow Up: Laqueta Due., MD [address] [place] [address] [phone]     [facility]  DEPT [address] [id number] mc [address] [place] [address] [phone]       Macallan Ord, Canary Brim, MD 04/02/19 [phone]

## 2019-04-01 NOTE — ED Notes (Signed)
This nurse went into room to clean up pt after a bout of incontinence. Pt asked this nurse "are the others coming in here? I do not want them in here." This nurse asked the patient who she was referring to, and the patient simply said "the others. I do not want to see the others." When asked to clarify who the others were the patient said "thank you so much for cleaning me up, I will try to get back to sleep." The patient then did not recall asking about the others. After the incident the patient was A&Ox4

## 2019-04-01 NOTE — ED Triage Notes (Signed)
Patient coming from home with complaints of shortness of breath that started about 10 days ago. Patient states that SOB is worse with exertion. Patient is able to talk in complete sentences and appears in NAD. Patient states that she was instructed to come back to the hospital when she received a phone call with her positive COVID result today.

## 2019-04-01 NOTE — ED Notes (Signed)
Pt given meal tray at this time 

## 2019-04-01 NOTE — Discharge Instructions (Signed)
For your behavioral health needs, you are advised to continue treatment with your current outpatient providers. °

## 2019-04-02 LAB — TROPONIN I (HIGH SENSITIVITY): Troponin I (High Sensitivity): 3 ng/L (ref <18)

## 2019-04-02 LAB — LACTIC ACID, PLASMA: Lactic Acid, Venous: 1 mmol/L (ref 0.5–1.9)

## 2019-04-02 NOTE — Discharge Instructions (Signed)
Your work-up today was consistent with continued shortness of breath with your COVID-19 infection however, we did work-up with labs and imaging showing that overall you have been improving.  The x-ray showed improving opacities in your lungs and we did not see evidence of new pneumonia.  Your lab work was all improved.  Your vital signs showed no hypoxia or tachycardia.  After our discussion, we did not find that you needed to be admitted and we feel you are safe for discharge home with close outpatient follow-up.  If symptoms change or worsen, please return to the nearest emergency department.  Please rest and stay hydrated.

## 2019-04-03 ENCOUNTER — Encounter (HOSPITAL_COMMUNITY): Payer: Self-pay | Admitting: Internal Medicine

## 2019-04-03 ENCOUNTER — Other Ambulatory Visit: Payer: Self-pay

## 2019-04-03 ENCOUNTER — Observation Stay (HOSPITAL_COMMUNITY): Payer: Medicare Other

## 2019-04-03 ENCOUNTER — Inpatient Hospital Stay (HOSPITAL_COMMUNITY)
Admission: EM | Admit: 2019-04-03 | Discharge: 2019-04-12 | DRG: 071 | Disposition: A | Discharge status: Home Health | Payer: Medicare Other | Attending: Internal Medicine | Admitting: Internal Medicine

## 2019-04-03 DIAGNOSIS — R52 Pain, unspecified: Secondary | ICD-10-CM | POA: Diagnosis present

## 2019-04-03 DIAGNOSIS — G9349 Other encephalopathy: Principal | ICD-10-CM | POA: Diagnosis present

## 2019-04-03 DIAGNOSIS — G9340 Encephalopathy, unspecified: Secondary | ICD-10-CM | POA: Diagnosis not present

## 2019-04-03 DIAGNOSIS — U071 COVID-19: Secondary | ICD-10-CM | POA: Diagnosis present

## 2019-04-03 DIAGNOSIS — F333 Major depressive disorder, recurrent, severe with psychotic symptoms: Secondary | ICD-10-CM | POA: Diagnosis present

## 2019-04-03 DIAGNOSIS — R410 Disorientation, unspecified: Secondary | ICD-10-CM

## 2019-04-03 DIAGNOSIS — Z79890 Hormone replacement therapy (postmenopausal): Secondary | ICD-10-CM

## 2019-04-03 DIAGNOSIS — I10 Essential (primary) hypertension: Secondary | ICD-10-CM

## 2019-04-03 DIAGNOSIS — K5900 Constipation, unspecified: Secondary | ICD-10-CM | POA: Diagnosis present

## 2019-04-03 DIAGNOSIS — R29898 Other symptoms and signs involving the musculoskeletal system: Secondary | ICD-10-CM | POA: Diagnosis present

## 2019-04-03 DIAGNOSIS — F411 Generalized anxiety disorder: Secondary | ICD-10-CM | POA: Diagnosis present

## 2019-04-03 DIAGNOSIS — R441 Visual hallucinations: Secondary | ICD-10-CM

## 2019-04-03 DIAGNOSIS — I959 Hypotension, unspecified: Secondary | ICD-10-CM | POA: Diagnosis present

## 2019-04-03 DIAGNOSIS — E785 Hyperlipidemia, unspecified: Secondary | ICD-10-CM | POA: Diagnosis present

## 2019-04-03 DIAGNOSIS — E039 Hypothyroidism, unspecified: Secondary | ICD-10-CM | POA: Diagnosis present

## 2019-04-03 DIAGNOSIS — E7800 Pure hypercholesterolemia, unspecified: Secondary | ICD-10-CM | POA: Diagnosis present

## 2019-04-03 DIAGNOSIS — R0600 Dyspnea, unspecified: Secondary | ICD-10-CM

## 2019-04-03 DIAGNOSIS — R109 Unspecified abdominal pain: Secondary | ICD-10-CM | POA: Diagnosis present

## 2019-04-03 DIAGNOSIS — G249 Dystonia, unspecified: Secondary | ICD-10-CM | POA: Diagnosis present

## 2019-04-03 DIAGNOSIS — G629 Polyneuropathy, unspecified: Secondary | ICD-10-CM | POA: Diagnosis present

## 2019-04-03 DIAGNOSIS — R001 Bradycardia, unspecified: Secondary | ICD-10-CM | POA: Diagnosis present

## 2019-04-03 DIAGNOSIS — Z993 Dependence on wheelchair: Secondary | ICD-10-CM

## 2019-04-03 DIAGNOSIS — Z79899 Other long term (current) drug therapy: Secondary | ICD-10-CM

## 2019-04-03 DIAGNOSIS — B948 Sequelae of other specified infectious and parasitic diseases: Secondary | ICD-10-CM

## 2019-04-03 LAB — CBC WITH DIFFERENTIAL/PLATELET
Abs Immature Granulocytes: 0.01 10*3/uL (ref 0.00–0.07)
Basophils Absolute: 0 10*3/uL (ref 0.0–0.1)
Basophils Relative: 1 %
Eosinophils Absolute: 0.2 10*3/uL (ref 0.0–0.5)
Eosinophils Relative: 4 %
HCT: 39.8 % (ref 36.0–46.0)
Hemoglobin: 13 g/dL (ref 12.0–15.0)
Immature Granulocytes: 0 %
Lymphocytes Relative: 27 %
Lymphs Abs: 1.5 10*3/uL (ref 0.7–4.0)
MCH: 32 pg (ref 26.0–34.0)
MCHC: 32.7 g/dL (ref 30.0–36.0)
MCV: 98 fL (ref 80.0–100.0)
Monocytes Absolute: 0.6 10*3/uL (ref 0.1–1.0)
Monocytes Relative: 10 %
Neutro Abs: 3.2 10*3/uL (ref 1.7–7.7)
Neutrophils Relative %: 58 %
Platelets: 293 10*3/uL (ref 150–400)
RBC: 4.06 MIL/uL (ref 3.87–5.11)
RDW: 13.2 % (ref 11.5–15.5)
WBC: 5.5 10*3/uL (ref 4.0–10.5)
nRBC: 0 % (ref 0.0–0.2)

## 2019-04-03 LAB — URINALYSIS, ROUTINE W REFLEX MICROSCOPIC
Bilirubin Urine: NEGATIVE
Glucose, UA: NEGATIVE mg/dL
Hgb urine dipstick: NEGATIVE
Ketones, ur: NEGATIVE mg/dL
Leukocytes,Ua: NEGATIVE
Nitrite: NEGATIVE
Protein, ur: NEGATIVE mg/dL
Specific Gravity, Urine: 1.012 (ref 1.005–1.030)
pH: 8 (ref 5.0–8.0)

## 2019-04-03 LAB — URINE CULTURE

## 2019-04-03 LAB — COMPREHENSIVE METABOLIC PANEL
ALT: 25 U/L (ref 0–44)
AST: 17 U/L (ref 15–41)
Albumin: 3.2 g/dL — ABNORMAL LOW (ref 3.5–5.0)
Alkaline Phosphatase: 82 U/L (ref 38–126)
Anion gap: 13 (ref 5–15)
BUN: 16 mg/dL (ref 8–23)
CO2: 24 mmol/L (ref 22–32)
Calcium: 9.5 mg/dL (ref 8.9–10.3)
Chloride: 99 mmol/L (ref 98–111)
Creatinine, Ser: 0.86 mg/dL (ref 0.44–1.00)
GFR calc Af Amer: >60 mL/min (ref >60)
GFR calc non Af Amer: >60 mL/min (ref >60)
Glucose, Bld: 109 mg/dL — ABNORMAL HIGH (ref 70–99)
Potassium: 3.7 mmol/L (ref 3.5–5.1)
Sodium: 136 mmol/L (ref 135–145)
Total Bilirubin: 0.7 mg/dL (ref 0.3–1.2)
Total Protein: 6.5 g/dL (ref 6.5–8.1)

## 2019-04-03 LAB — TSH: TSH: 4.761 u[IU]/mL — ABNORMAL HIGH (ref 0.350–4.500)

## 2019-04-03 LAB — CBG MONITORING, ED: Glucose-Capillary: 99 mg/dL (ref 70–99)

## 2019-04-03 MED ORDER — CARVEDILOL 6.25 MG PO TABS
6.2500 mg | ORAL_TABLET | Freq: Two times a day (BID) | ORAL | Status: DC
  Filled 2019-04-03: qty 1

## 2019-04-03 MED ORDER — IRBESARTAN 75 MG PO TABS: 37.5000 mg | ORAL_TABLET | Freq: Every day | ORAL | Status: DC

## 2019-04-03 MED ORDER — ACETAMINOPHEN 650 MG RE SUPP: 650.0000 mg | Freq: Four times a day (QID) | RECTAL | Status: DC | PRN

## 2019-04-03 MED ORDER — ONDANSETRON HCL 4 MG/2ML IJ SOLN: 4.0000 mg | Freq: Four times a day (QID) | INTRAMUSCULAR | Status: DC | PRN

## 2019-04-03 MED ORDER — LEVOTHYROXINE SODIUM 75 MCG PO TABS: 75.0000 ug | ORAL_TABLET | Freq: Every day | ORAL | Status: DC

## 2019-04-03 MED ORDER — ESCITALOPRAM OXALATE 10 MG PO TABS: 10.0000 mg | ORAL_TABLET | Freq: Every day | ORAL | Status: DC

## 2019-04-03 MED ORDER — ACETAMINOPHEN 325 MG PO TABS
650.0000 mg | ORAL_TABLET | Freq: Four times a day (QID) | ORAL | Status: DC | PRN
  Administered 2019-04-04 – 2019-04-11 (×3): 650 mg via ORAL
  Filled 2019-04-03 (×4): qty 2

## 2019-04-03 MED ORDER — ONDANSETRON HCL 4 MG PO TABS: 4.0000 mg | ORAL_TABLET | Freq: Four times a day (QID) | ORAL | Status: DC | PRN

## 2019-04-03 MED ORDER — SODIUM CHLORIDE 0.9 % IV SOLN
INTRAVENOUS | Status: DC
  Administered 2019-04-04 (×2): via INTRAVENOUS

## 2019-04-03 MED ORDER — EZETIMIBE 10 MG PO TABS: 10.0000 mg | ORAL_TABLET | Freq: Every day | ORAL | Status: DC

## 2019-04-03 NOTE — ED Triage Notes (Signed)
Pt to ED, continues to state "I dont know anything, I have dementia" Pt tells this nurse her husband brought her here.

## 2019-04-03 NOTE — H&P (Signed)
History and Physical    [name] EVO:[id number] DOB: 1945/06/15 DOA: 04/03/2019  PCP: Karleen Hampshire., MD  Patient coming from: Home.  History obtained from patient's husband.  Chief Complaint: Confusion and agitation.  HPI: Debra Morgan is a 73 y.o. female with history of hypertension, hyperlipidemia, hypothyroidism, dystonia was recently admitted on March 07, 2019 last month for COVID-19 infection subsequent to which patient has become increasingly encephalopathic with difficulty walking with bursts of agitation.  Patient has pain to the hospital multiple times for the similar complaints.  During the last admission patient also had a CT angio of the head and neck which was unremarkable.  Per patient has been patient usually gets awake by around midnight and goes to the bathroom and gets confused.  Following which patient also becomes agitated.  Does not answer questions appropriately and also states he does not know what is going on.  Has not had any nausea vomiting abdominal pain diarrhea or did not complain of any chest pain was afebrile.  ED Course: In the ER patient appears confused failed swallow evaluation and at this time neurologist recommended getting MRI brain EEG sed rate and D-dimer.  Patient's recent COVID-19 test 3 days ago was positive.  Labs revealed creatinine 1.7 hemoglobin 12.2 platelets 285 sed rate is 90 D-dimer 0.4.  Review of Systems: As per HPI, rest all negative.   Past Medical History:  Diagnosis Date  . Dystonia   . High cholesterol   . Hypertension   . Thyroid disease     Past Surgical History:  Procedure Laterality Date  . TUBAL LIGATION       reports that she has never smoked. She has never used smokeless tobacco. She reports current alcohol use. She reports that she does not use drugs.  Allergies  Allergen Reactions  . Sinemet [Carbidopa W-Levodopa] Other (See Comments)    Feels "jittery"  . Codeine Palpitations  . Tetracyclines & Related  Rash    History reviewed. No pertinent family history.  Prior to Admission medications   Medication Sig Start Date End Date Taking? Authorizing Provider  acetaminophen (TYLENOL) 500 MG tablet Take 1,000 mg by mouth every 6 (six) hours as needed for headache.    Yes [provider]  candesartan (ATACAND) 4 MG tablet Take 4 mg by mouth every morning.  08/26/18  Yes [provider]  carvedilol (COREG) 6.25 MG tablet Take 1 tablet (6.25 mg total) by mouth 2 (two) times daily. 03/26/19 04/25/19 Yes Arrien, Jimmy Picket, MD  escitalopram (LEXAPRO) 10 MG tablet Take 10 mg by mouth daily.   Yes [provider]  ezetimibe (ZETIA) 10 MG tablet Take 1 tablet (10 mg total) by mouth daily. 03/24/19 04/23/19 Yes Arrien, Jimmy Picket, MD  levothyroxine (SYNTHROID) 75 MCG tablet Take 75 mcg by mouth daily at 6 (six) AM.  08/10/18  Yes [provider]  Multiple Vitamin (MULTIVITAMIN WITH MINERALS) TABS tablet Take 1 tablet by mouth daily.   Yes [provider]  Multiple Vitamins-Minerals (AIRBORNE) TBEF Take 1 tablet by mouth daily as needed (immune system boost).   Yes [provider]  Pseudoephedrine-Ibuprofen (ADVIL COLD/SINUS PO) Take 1 tablet by mouth daily as needed (cough/congestion).   Yes [provider]  triamterene-hydrochlorothiazide (MAXZIDE-25) 37.5-25 MG tablet Take 1 tablet by mouth every morning.  09/13/18  Yes [provider]  escitalopram (LEXAPRO) 5 MG tablet Take 1 tablet (5 mg total) by mouth daily. Patient not taking: Reported on 04/03/2019 03/27/19  04/26/19  Arrien, [place] RamMauricio Daniel, MD    Physical Exam: Constitutional: Moderately built and nourished. Vitals:   04/03/19 2045 04/03/19 2100 04/03/19 2115 04/03/19 2130  BP: 125/77 128/78 126/72 103/68  Pulse: (!) 57 (!) 58 61 (!) 57  Resp: 12 19 15 15   Temp:      TempSrc:      SpO2: 96% 98% 97% 97%  Weight:      Height:       Eyes: Anicteric no pallor. ENMT: No  discharge from the ears eyes nose or mouth. Neck: No mass or.  No neck rigidity. Respiratory: No rhonchi or crepitations. Cardiovascular: S1-S2 heard. Abdomen: Soft nontender bowel sounds present. Musculoskeletal: No edema. Skin: No rash. Neurologic: Alert awake oriented to name only.  Moves all extremities but lower extremity appears weak. Psychiatric: Appears confused.   Labs on Admission: I have personally reviewed following labs and imaging studies  CBC: Recent Labs  Lab 03/31/19 1042 04/01/19 2100 04/03/19 1800  WBC 5.7 5.3 5.5  NEUTROABS 3.6 3.0 3.2  HGB 11.9* 12.4 13.0  HCT 37.1 39.2 39.8  MCV 98.1 100.8* 98.0  PLT 281 274 293   Basic Metabolic Panel: Recent Labs  Lab 03/31/19 1042 04/01/19 2100 04/03/19 1800  NA 137 138 136  K 3.4* 3.8 3.7  CL 103 101 99  CO2 24 28 24   GLUCOSE 113* 112* 109*  BUN 22 25* 16  CREATININE 0.90 0.98 0.86  CALCIUM 8.9 9.4 9.5   GFR: Estimated Creatinine Clearance: 57.2 mL/min (by C-G formula based on SCr of 0.86 mg/dL). Liver Function Tests: Recent Labs  Lab 03/31/19 1042 04/01/19 2100 04/03/19 1800  AST 20 19 17   ALT 42 34 25  ALKPHOS 77 92 82  BILITOT 0.7 0.4 0.7  PROT 6.4* 7.0 6.5  ALBUMIN 3.2* 3.4* 3.2*   Recent Labs  Lab 03/31/19 1042  LIPASE 30   No results for input(s): AMMONIA in the last 168 hours. Coagulation Profile: No results for input(s): INR, PROTIME in the last 168 hours. Cardiac Enzymes: No results for input(s): CKTOTAL, CKMB, CKMBINDEX, TROPONINI in the last 168 hours. BNP (last 3 results) No results for input(s): PROBNP in the last 8760 hours. HbA1C: No results for input(s): HGBA1C in the last 72 hours. CBG: No results for input(s): GLUCAP in the last 168 hours. Lipid Profile: No results for input(s): CHOL, HDL, LDLCALC, TRIG, CHOLHDL, LDLDIRECT in the last 72 hours. Thyroid Function Tests: Recent Labs    04/03/19 1800  TSH 4.761*   Anemia Panel: No results for input(s): VITAMINB12,  FOLATE, FERRITIN, TIBC, IRON, RETICCTPCT in the last 72 hours. Urine analysis:    Component Value Date/Time   COLORURINE YELLOW 04/03/2019 1800   APPEARANCEUR CLOUDY (A) 04/03/2019 1800   LABSPEC 1.012 04/03/2019 1800   PHURINE 8.0 04/03/2019 1800   GLUCOSEU NEGATIVE 04/03/2019 1800   HGBUR NEGATIVE 04/03/2019 1800   BILIRUBINUR NEGATIVE 04/03/2019 1800   KETONESUR NEGATIVE 04/03/2019 1800   PROTEINUR NEGATIVE 04/03/2019 1800   NITRITE NEGATIVE 04/03/2019 1800   LEUKOCYTESUR NEGATIVE 04/03/2019 1800   Sepsis Labs: @LABRCNTIP (procalcitonin:4,lacticidven:4) ) Recent Results (from the past 240 hour(s))  MRSA PCR Screening     Status: None   Collection Time: 03/25/19  5:15 AM   Specimen: Nasal Mucosa; Nasopharyngeal  Result Value Ref Range Status   MRSA by PCR NEGATIVE NEGATIVE Final    Comment:        The GeneXpert MRSA Assay (FDA approved for NASAL specimens only), is one component  of a comprehensive MRSA colonization surveillance program. It is not intended to diagnose MRSA infection nor to guide or monitor treatment for MRSA infections. Performed at [facility], [address], [place], [place] [address]   SARS CORONAVIRUS 2 (TAT 6-24 HRS) Nasopharyngeal Nasopharyngeal Swab     Status: Abnormal   Collection Time: 04/01/19  8:33 AM   Specimen: Nasopharyngeal Swab  Result Value Ref Range Status   SARS Coronavirus 2 POSITIVE (A) NEGATIVE Final    Comment: RESULT CALLED TO, READ BACK BY AND VERIFIED WITH: Ranell Patrick, ED RN BY EMAIL AT 1621 ON 04/01/19 BY C. JESSUP, MT. (NOTE) SARS-CoV-2 target nucleic acids are DETECTED. The SARS-CoV-2 RNA is generally detectable in upper and lower respiratory specimens during the acute phase of infection. Positive results are indicative of active infection with SARS-CoV-2. Clinical  correlation with patient history and other diagnostic information is necessary to determine patient infection status. Positive results do   not rule out bacterial infection or co-infection with other viruses. The expected result is Negative. Fact Sheet for Patients: HairSlick.no Fact Sheet for Healthcare Providers: quierodirigir.com This test is not yet approved or cleared by the [place] FDA and  has been authorized for detection and/or diagnosis of SARS-CoV-2 by FDA under an Emergency Use Authorization (EUA). This EUA will remain  in effect (meaning  this test can be used) for the duration of the COVID-19 declaration under Section 564(b)(1) of the Act, 21 U.S.C. section 360bbb-3(b)(1), unless the authorization is terminated or revoked sooner. Performed at [facility], [address], [place], [place] [address]   Urine culture     Status: None   Collection Time: 04/01/19  8:23 PM   Specimen: Urine, Clean Catch  Result Value Ref Range Status   Specimen Description   Final    URINE, CLEAN CATCH Performed at [facility], [address], [place], [place] [address]    Special Requests   Final    NONE Performed at [facility], [address], [place], [place] [address]    Culture   Final    Multiple bacterial morphotypes present, none predominant. Suggest appropriate recollection if clinically indicated.   Report Status 04/03/2019 FINAL  Final     Radiological Exams on Admission: No results found.    Assessment/Plan Principal Problem:   Acute encephalopathy Active Problems:   Hypertension   Hypothyroidism    1. Acute encephalopathy -discussed with on-call neurologist Dr. Amada Jupiter who at this time feels that patient symptoms may be Covid related encephalopathy.  MRI brain EEG has been ordered.  Further recommendation based on these studies.  Patient failed swallow will need to get speech therapy consult. 2. Hypertension we will keep patient on as needed IV labetalol since patient failed swallow.  Restart  home medication once patient passed swallow. 3. Hypothyroidism we will keep patient IV Synthroid until patient passes swallow. 4. History of depression presently n.p.o. 5. Recent COVID-19 infection repeat Covid test is pending.  However patient does not have any definite symptoms for active Covid infection at this time.  Will closely monitor.   DVT prophylaxis: SCDs.  If no procedure left lumbar puncture is planned then may change to Lovenox. Code Status: DNI confirmed with patient's husband. Family Communication: Patient husband. Disposition Plan: Home. Consults called: Neurology. Admission status: Observation.   Eduard Clos MD [facility] Pager [phone].  If 7PM-7AM, please contact night-coverage [email or web address] Password [email or web address]  04/03/2019, 11:16 PM

## 2019-04-03 NOTE — ED Notes (Signed)
PT husband has information for patient [phone]. Also stated he saw blood when cleaning then patient.

## 2019-04-03 NOTE — ED Notes (Signed)
This nurse and EDP to bedside, EDP to look at pt rectum d/t pt husband concern

## 2019-04-03 NOTE — Consult Note (Signed)
Neurology Consultation Reason for Consult: Spells Referring Physician:  CC: Altered mental status  History is obtained from: Patient  HPI: Debra Morgan is a 73 y.o. female with a history of dystonia who presents with altered mental status that has been waxing and waning since she was diagnosed with Covid back in October.  She has been to the hospital approximately 8 times for altered mental status since that time.  It typically happens when she awakens in the middle the night, she becomes confused and agitated.  These episodes then gradually improve and she is released.  She was recently seen and [place] [facility] complaining that everything hurts and stating "I do not know"  repeatedly.  She was evaluated by psychiatry the next day at which point she had improved and was discharged home.  Of note, she has dystonia which was diagnosed 2.5 years ago and had been walking, though haltingly, up until she got Covid.  Since that time she has been much more debilitated.   Last night, she woke up about 2:30 AM needing to use the restroom.  She asked her husband where certain people were that would not normally be present(her son in law and daughter who would not be anywhere near here).  She then went back to bed.  Around 530, she stated "I am dying" complaining of pain and was agitated.  She also complained that she could not breathe and was having chest pain.   ROS: A 14 point ROS was performed and is negative except as noted in the HPI.  Past Medical History:  Diagnosis Date  . Dystonia   . High cholesterol   . Hypertension   . Thyroid disease     Fhx: Unable to assess secondary to patient's altered mental status.     Social History:  reports that she has never smoked. She has never used smokeless tobacco. She reports current alcohol use. She reports that she does not use drugs.   Exam: Current vital signs: BP 135/73   Pulse (!) 59   Temp 97.8 F (36.6 C) (Oral)   Resp 12   Ht 5' 4"  (1.626  m)   Wt 75.6 kg Comment: per record  SpO2 97%   BMI 28.61 kg/m  Vital signs in last 24 hours: Temp:  [97.8 F (36.6 C)] 97.8 F (36.6 C) (11/15 1649) Pulse Rate:  [57-64] 59 (11/15 1915) Resp:  [12-18] 12 (11/15 1915) BP: (103-135)/(70-77) 135/73 (11/15 1915) SpO2:  [96 %-99 %] 97 % (11/15 1915) Weight:  [75.6 kg] 75.6 kg (11/15 1655)   Physical Exam  Constitutional: Appears well-developed and well-nourished.  Psych: Affect appropriate to situation Eyes: No scleral injection HENT: No OP obstrucion MSK: no joint deformities.  Cardiovascular: Normal rate and regular rhythm.  Respiratory: Effort normal, non-labored breathing GI: Soft.  No distension. There is no tenderness.  Skin: WDI  Neuro: Mental Status: Patient is awake, alert, when asked orientation questions, she repeatedly states "I do not know" she would not even give me a guess.  When I ask her to name simple objects, she states that she does not even know what they are, much less know the name.  She is able to name her shoe and states "it is very stinky." Cranial Nerves: II: Visual Fields are full. Pupils are equal, round, and reactive to light.   III,IV, VI: EOMI without ptosis or diploplia.  V: Facial sensation is symmetric to temperature VII: Facial movement is symmetric.  VIII: hearing is intact to  voice X: Uvula elevates symmetrically XI: Shoulder shrug is symmetric. XII: tongue is midline without atrophy or fasciculations.  Motor: She has increased tone in her right lower extremity, is unable to bend her knee.  She has good strength in her other 3 extremities. Sensory: Sensation is symmetric to light touch and temperature in the arms and legs, but decreased in the symmetric pattern distally. Deep Tendon Reflexes: 2+ and symmetric in the biceps, difficult to elicit in the right knee due to increased tone, intact in the left Plantars: Toes upgoing on the right, downgoing on the left Cerebellar: No clear  ataxia the arms   I have reviewed labs in epic and the results pertinent to this consultation are:   I have reviewed the images obtained:CT head - 11/12 no acute intracrnial findings  Impression: 73 year old female with waxing/waning encephalopathy since being diagnosed with Covid in October.  I suspect that she has a mild Covid encephalopathy which can be prolonged.  This coupled with other exacerbating factors such as being awoken in the middle the night (most of her episodes of confusion have arisen in the middle the night), pain, or other physiological stressors could work to exacerbate multifactorial delirium.  Also, I do think assessment for inflammatory markers might help shed some light.  Being said, given the prolonged nature and the waxing/waning character, I do think further evaluation with an MRI and an EEG would be prudent.  Recommendations: 1) MRI brain 2) EEG 3) ESR, D-dimer  Roland Rack, MD [facility] [phone]  If 7pm- 7am, please page neurology on call as listed in [place].

## 2019-04-03 NOTE — ED Notes (Signed)
Pt endorses hx of dementia and parkinson's. Both pt's daughter and husband deny these previous diagnosis for the patient.

## 2019-04-03 NOTE — ED Provider Notes (Signed)
[facility] EM[facility]ENC[facility]PART[facility] Provider Note   CSN: [id number] Arrival date & time: 04/03/19  1630     History   Chief Complaint Chief Complaint  Patient presents with   Altered Mental Status    HPI Debra Morgan is a 73 y.o. female.     Patient is a 73 year old female who presents with confusion.  She has history of hypothyroidism, hypertension and dystonia in her right lower leg.  She was recently treated for Covid 19 which was diagnosed on October 16.  She was admitted the hospital on October 22 to the 26.  She had a decline after discharge and was readmitted from October 28 to November 7.  During that hospitalization she had an episode of catatonia.  She became suddenly unresponsive and a code stroke was called.  She was transferred to the ICU.  Her CT and CTAs were negative and her symptoms resolved spontaneously.  It was felt this could be psychogenic in nature.  She had another episode a few days ago where she was transported to the emergency department with a similar type episode.  She was initially going to be admitted to the hospital but the hospitalist requested psychiatric consult.  She was kept overnight awaiting a psychiatric consult in the morning and at that point her symptoms had resolved and she was completely oriented and was discharged.  Her husband says that she keeps having the spells and he is very frustrated and does not know what to do.  He wants us to find out what is wrong with her.  He states that yesterday she was at her baseline and acting perfectly fine.  During the night she started getting more confused.  She was talking about family members who were not there.  She would had episodes where she screams that she needs to go to the bathroom.  He will carry to the bathroom and put her back in bed and then she starts screaming again.  During these episodes she does not know who she is or who he is.  She intermittently yells and babbles.  She will  then go back to sleep and wake up a few hours later screaming and doing the same thing.  She tells me that she has a history of dementia but the husband says this is not true.     Past Medical History:  Diagnosis Date   Dystonia    High cholesterol    Hypertension    Thyroid disease     Patient Active Problem List   Diagnosis Date Noted   Major depressive disorder, recurrent episode, moderate (HCC) 03/21/2019   Generalized weakness 03/16/2019   Thyroid disease    Dystonia    Pneumonia due to COVID-19 virus    Elevated transaminase level    Hypertension    Chest pain    COVID-19 virus infection     Past Surgical History:  Procedure Laterality Date   TUBAL LIGATION       OB History   No obstetric history on file.      Home Medications    Prior to Admission medications   Medication Sig Start Date End Date Taking? Authorizing Provider  acetaminophen (TYLENOL) 500 MG tablet Take 1,000 mg by mouth every 6 (six) hours as needed for moderate pain.    [provider]  busPIRone (BUSPAR) 7.5 MG tablet Take 7.5 mg by mouth 2 (two) times daily.    [provider]  candesartan (ATACAND) 4 MG  tablet Take 4 mg by mouth daily. 08/26/18   [provider]  carvedilol (COREG) 6.25 MG tablet Take 1 tablet (6.25 mg total) by mouth 2 (two) times daily. 03/26/19 04/25/19  Arrien, Jimmy Picket, MD  escitalopram (LEXAPRO) 5 MG tablet Take 1 tablet (5 mg total) by mouth daily. 03/27/19 04/26/19  Arrien, Jimmy Picket, MD  ezetimibe (ZETIA) 10 MG tablet Take 1 tablet (10 mg total) by mouth daily. 03/24/19 04/23/19  Arrien, Jimmy Picket, MD  levothyroxine (SYNTHROID) 75 MCG tablet Take 75 mcg by mouth daily. 08/10/18   [provider]  triamterene-hydrochlorothiazide (MAXZIDE-25) 37.5-25 MG tablet Take 1 tablet by mouth daily. 09/13/18   [provider]  zinc sulfate (ZINC-220) 220 (50 Zn) MG capsule Take 220 mg by mouth daily.     [provider]    Family History No family history on file.  Social History Social History   Tobacco Use   Smoking status: Never Smoker   Smokeless tobacco: Never Used  Substance Use Topics   Alcohol use: Yes    Comment: social drinker, infrequent   Drug use: Never     Allergies   Sinemet [carbidopa w-levodopa], Codeine, and Tetracyclines & related   Review of Systems Review of Systems  Unable to perform ROS: Mental status change     Physical Exam Updated Vital Signs BP 135/73    Pulse (!) 59    Temp 97.8 F (36.6 C) (Oral)    Resp 12    Ht 5\' 4"  (1.626 m)    Wt 75.6 kg Comment: per record   SpO2 97%    BMI 28.61 kg/m   Physical Exam Constitutional:      Appearance: She is well-developed. She is ill-appearing.  HENT:     Head: Normocephalic and atraumatic.  Eyes:     Pupils: Pupils are equal, round, and reactive to light.  Neck:     Musculoskeletal: Normal range of motion and neck supple.  Cardiovascular:     Rate and Rhythm: Normal rate and regular rhythm.     Heart sounds: Normal heart sounds.  Pulmonary:     Effort: Pulmonary effort is normal. No respiratory distress.     Breath sounds: Normal breath sounds. No wheezing or rales.  Chest:     Chest wall: No tenderness.  Abdominal:     General: Bowel sounds are normal.     Palpations: Abdomen is soft.     Tenderness: There is no abdominal tenderness. There is no guarding or rebound.  Genitourinary:    Comments: No blood noted on rectal exam Musculoskeletal: Normal range of motion.  Lymphadenopathy:     Cervical: No cervical adenopathy.  Skin:    General: Skin is warm and dry.     Findings: No rash.  Neurological:     Mental Status: She is alert.     Comments: Patient is disoriented to person place and time.  She will however tell me her recent hospitalizations for "that infection" she is weak in all of her extremities.  She has good grip strength bilaterally but if I try to hold her  arms up she drops both of them to the ground.  She will not lift either leg off the bed however on Babinski testing she raises each leg slightly off the bed.      ED Treatments / Results  Labs (all labs ordered are listed, but only abnormal results are displayed) Labs Reviewed  COMPREHENSIVE METABOLIC PANEL - Abnormal; Notable for  the following components:      Result Value   Glucose, Bld 109 (*)    Albumin 3.2 (*)    All other components within normal limits  URINALYSIS, ROUTINE W REFLEX MICROSCOPIC - Abnormal; Notable for the following components:   APPearance CLOUDY (*)    All other components within normal limits  TSH - Abnormal; Notable for the following components:   TSH 4.761 (*)    All other components within normal limits  SARS CORONAVIRUS 2 (TAT 6-24 HRS)  CBC WITH DIFFERENTIAL/PLATELET    EKG None  Radiology Dg Chest Portable 1 View  Result Date: 04/01/2019 CLINICAL DATA:  COVID positive. Chest pain, shortness of breath, cough EXAM: PORTABLE CHEST 1 VIEW COMPARISON:  03/31/2019 FINDINGS: Patchy airspace disease in the right upper lobe and left lower lobe have improved. Mild residual opacities remain in both areas, most pronounced at the left base. No effusions. Heart is borderline in size. No acute bony abnormality. IMPRESSION: Improving bilateral airspace opacities. Electronically Signed   By: Charlett Nose M.D.   On: 04/01/2019 20:17    Procedures Procedures (including critical care time)  Medications Ordered in ED Medications - No data to display   Initial Impression / Assessment and Plan / ED Course  I have reviewed the triage vital signs and the nursing notes.  Pertinent labs & imaging results that were available during my care of the patient were reviewed by me and considered in my medical decision making (see chart for details).        Patient is a 73 year old female who presents with altered mental status and confusion.  She does not have any focal  neurologic deficits.  She has no reported history of dementia per husband.  She has had these transient spells over the last month.  Seem to be worse today which is why her husband brought her back.  Her labs are nonconcerning.  Her TSH is mildly elevated but I do not think this is the cause of her symptoms.  I did not do a head CT given that she has had 2 - CTs for the same type episodes.  I did speak with Dr. Amada Jupiter with neurology who recommends admitting the patient and having an EEG and MRI while she is in the hospital.  He will come and see the patient.  I spoke with Dr. Toniann Fail who will admit the patient for further treatment.  Final Clinical Impressions(s) / ED Diagnoses   Final diagnoses:  Delirium    ED Discharge Orders    None       Rolan Bucco, MD 04/03/19 [phone]

## 2019-04-03 NOTE — ED Notes (Signed)
Debra Morgan daughter [phone] looking for an update ASAP

## 2019-04-03 NOTE — ED Notes (Signed)
Debra Morgan  [phone] husband  For an update

## 2019-04-04 ENCOUNTER — Inpatient Hospital Stay (HOSPITAL_COMMUNITY): Payer: Medicare Other

## 2019-04-04 ENCOUNTER — Observation Stay (HOSPITAL_COMMUNITY): Payer: Medicare Other

## 2019-04-04 DIAGNOSIS — E7800 Pure hypercholesterolemia, unspecified: Secondary | ICD-10-CM | POA: Diagnosis present

## 2019-04-04 DIAGNOSIS — R109 Unspecified abdominal pain: Secondary | ICD-10-CM | POA: Diagnosis present

## 2019-04-04 DIAGNOSIS — R1084 Generalized abdominal pain: Secondary | ICD-10-CM | POA: Diagnosis not present

## 2019-04-04 DIAGNOSIS — Z79890 Hormone replacement therapy (postmenopausal): Secondary | ICD-10-CM | POA: Diagnosis not present

## 2019-04-04 DIAGNOSIS — I10 Essential (primary) hypertension: Secondary | ICD-10-CM | POA: Diagnosis present

## 2019-04-04 DIAGNOSIS — G629 Polyneuropathy, unspecified: Secondary | ICD-10-CM | POA: Diagnosis present

## 2019-04-04 DIAGNOSIS — U071 COVID-19: Secondary | ICD-10-CM

## 2019-04-04 DIAGNOSIS — B948 Sequelae of other specified infectious and parasitic diseases: Secondary | ICD-10-CM | POA: Diagnosis not present

## 2019-04-04 DIAGNOSIS — R52 Pain, unspecified: Secondary | ICD-10-CM

## 2019-04-04 DIAGNOSIS — G9340 Encephalopathy, unspecified: Secondary | ICD-10-CM | POA: Diagnosis not present

## 2019-04-04 DIAGNOSIS — R001 Bradycardia, unspecified: Secondary | ICD-10-CM | POA: Diagnosis not present

## 2019-04-04 DIAGNOSIS — E039 Hypothyroidism, unspecified: Secondary | ICD-10-CM | POA: Diagnosis present

## 2019-04-04 DIAGNOSIS — R410 Disorientation, unspecified: Secondary | ICD-10-CM

## 2019-04-04 DIAGNOSIS — F411 Generalized anxiety disorder: Secondary | ICD-10-CM | POA: Diagnosis present

## 2019-04-04 DIAGNOSIS — R441 Visual hallucinations: Secondary | ICD-10-CM | POA: Diagnosis not present

## 2019-04-04 DIAGNOSIS — Z993 Dependence on wheelchair: Secondary | ICD-10-CM | POA: Diagnosis not present

## 2019-04-04 DIAGNOSIS — Z79899 Other long term (current) drug therapy: Secondary | ICD-10-CM | POA: Diagnosis not present

## 2019-04-04 DIAGNOSIS — G249 Dystonia, unspecified: Secondary | ICD-10-CM | POA: Diagnosis present

## 2019-04-04 DIAGNOSIS — G9349 Other encephalopathy: Secondary | ICD-10-CM | POA: Diagnosis not present

## 2019-04-04 DIAGNOSIS — I959 Hypotension, unspecified: Secondary | ICD-10-CM | POA: Diagnosis present

## 2019-04-04 DIAGNOSIS — F449 Dissociative and conversion disorder, unspecified: Secondary | ICD-10-CM | POA: Diagnosis not present

## 2019-04-04 DIAGNOSIS — E785 Hyperlipidemia, unspecified: Secondary | ICD-10-CM | POA: Diagnosis present

## 2019-04-04 DIAGNOSIS — R29898 Other symptoms and signs involving the musculoskeletal system: Secondary | ICD-10-CM

## 2019-04-04 DIAGNOSIS — K5900 Constipation, unspecified: Secondary | ICD-10-CM | POA: Diagnosis present

## 2019-04-04 DIAGNOSIS — F333 Major depressive disorder, recurrent, severe with psychotic symptoms: Secondary | ICD-10-CM | POA: Diagnosis present

## 2019-04-04 LAB — CBC
HCT: 37.9 % (ref 36.0–46.0)
Hemoglobin: 12.2 g/dL (ref 12.0–15.0)
MCH: 31.9 pg (ref 26.0–34.0)
MCHC: 32.2 g/dL (ref 30.0–36.0)
MCV: 99 fL (ref 80.0–100.0)
Platelets: 285 K/uL (ref 150–400)
RBC: 3.83 MIL/uL — ABNORMAL LOW (ref 3.87–5.11)
RDW: 13.2 % (ref 11.5–15.5)
WBC: 4.7 K/uL (ref 4.0–10.5)
nRBC: 0 % (ref 0.0–0.2)

## 2019-04-04 LAB — BASIC METABOLIC PANEL WITH GFR
Anion gap: 12 (ref 5–15)
BUN: 14 mg/dL (ref 8–23)
CO2: 23 mmol/L (ref 22–32)
Calcium: 9.3 mg/dL (ref 8.9–10.3)
Chloride: 102 mmol/L (ref 98–111)
Creatinine, Ser: 0.71 mg/dL (ref 0.44–1.00)
GFR calc Af Amer: >60 mL/min (ref >60)
GFR calc non Af Amer: >60 mL/min (ref >60)
Glucose, Bld: 111 mg/dL — ABNORMAL HIGH (ref 70–99)
Potassium: 3.6 mmol/L (ref 3.5–5.1)
Sodium: 137 mmol/L (ref 135–145)

## 2019-04-04 LAB — BRAIN NATRIURETIC PEPTIDE: B Natriuretic Peptide: 33.9 pg/mL (ref 0.0–100.0)

## 2019-04-04 LAB — SEDIMENTATION RATE: Sed Rate: 90 mm/hr — ABNORMAL HIGH (ref 0–22)

## 2019-04-04 LAB — D-DIMER, QUANTITATIVE: D-Dimer, Quant: 0.4 ug/mL-FEU (ref 0.00–0.50)

## 2019-04-04 LAB — LIPASE, BLOOD: Lipase: 25 U/L (ref 11–51)

## 2019-04-04 LAB — SARS CORONAVIRUS 2 (TAT 6-24 HRS): SARS Coronavirus 2: POSITIVE — AB

## 2019-04-04 IMAGING — MR MR HEAD W/O CM
10 of 11 series · 37 of 48 positions shown · non-contrast
Comparison: CT head [DATE].  MRI [DATE]

CLINICAL DATA: Altered level of consciousness.

EXAM:
MRI HEAD WITHOUT CONTRAST
TECHNIQUE: Multiplanar, multiecho pulse sequences of the brain and surrounding
structures were obtained without intravenous contrast.

[Series 3: DWI · axial · 3.0mm · 1.02mm/px · z∈[-59,+90]mm · 8 of 106 slices shown (1 of 4)]
[im 1/106]
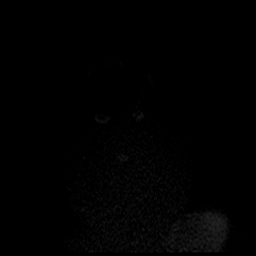
[im 12/106]
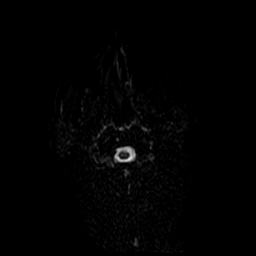
[im 36/106]
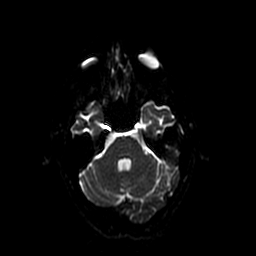
[im 47/106]
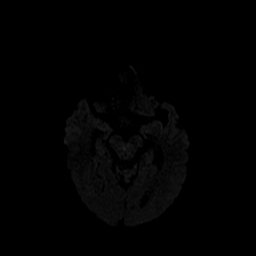
[im 59/106]
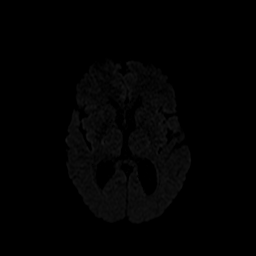
[im 71/106]
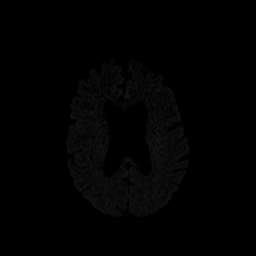
[im 94/106]
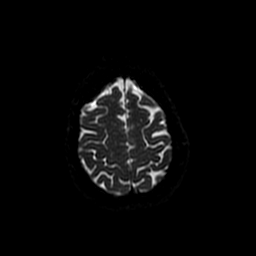
[im 106/106]
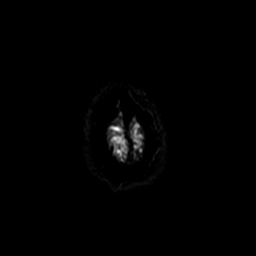

[Series 4: DWI · coronal · 5.0mm · 1.09mm/px · 8 of 82 slices shown (2 of 4)]
[im 1/82]
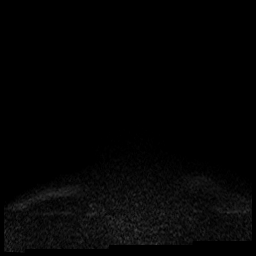
[im 12/82]
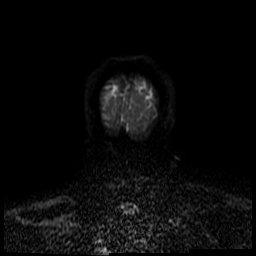
[im 24/82]
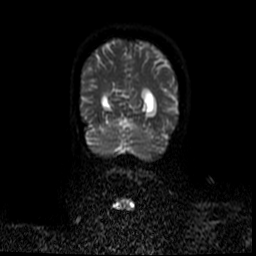
[im 35/82]
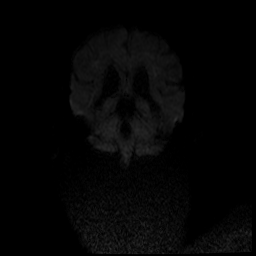
[im 47/82]
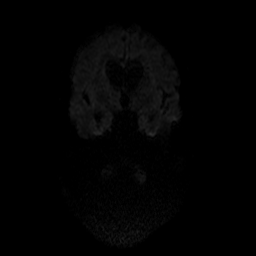
[im 58/82]
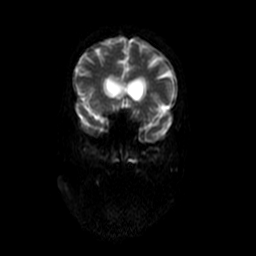
[im 70/82]
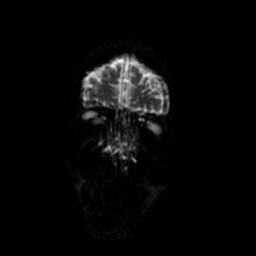
[im 82/82]
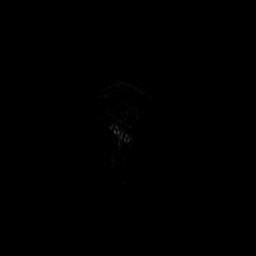

[Series 5: FLAIR · axial · 3.0mm · 0.43mm/px · z∈[-59,+89]mm · 2 of 27 slices shown (1 of 2)]
[im 1/27]
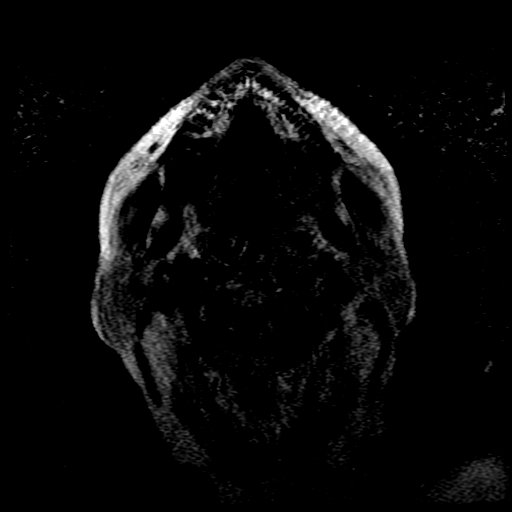
[im 27/27]
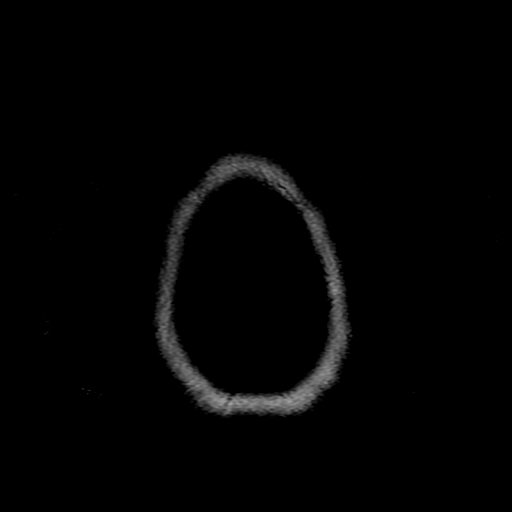

[Series 7: T1 · sagittal · 5.0mm · 0.94mm/px · 2 of 26 slices shown (1 of 2)]
[im 1/26]
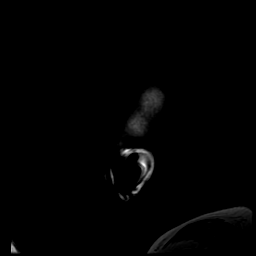
[im 26/26]
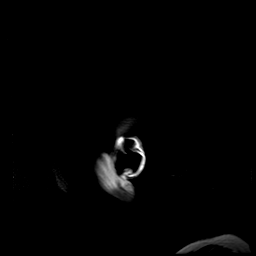

[Series 8: T2 · axial · 5.0mm · 0.47mm/px · z∈[-55,+99]mm · 2 of 28 slices shown (1 of 2)]
[im 1/28]
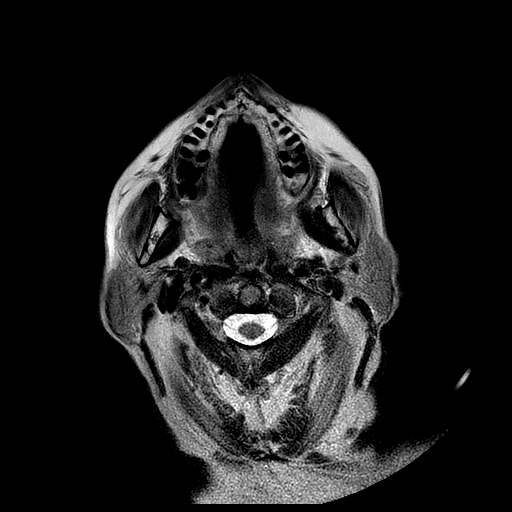
[im 28/28]
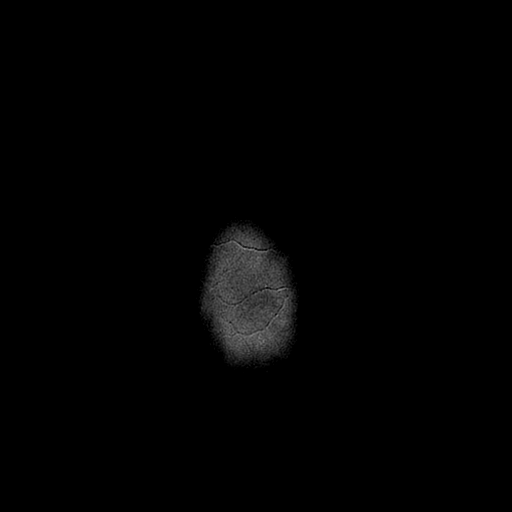

[Series 9: T1 · axial · 3.0mm · 0.47mm/px · z∈[-56,-38]mm · 2 of 108 slices shown (2 of 2)]
[im 1/108]
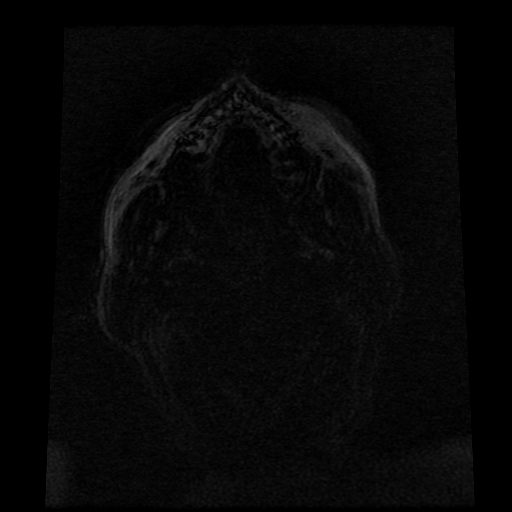
[im 14/108]
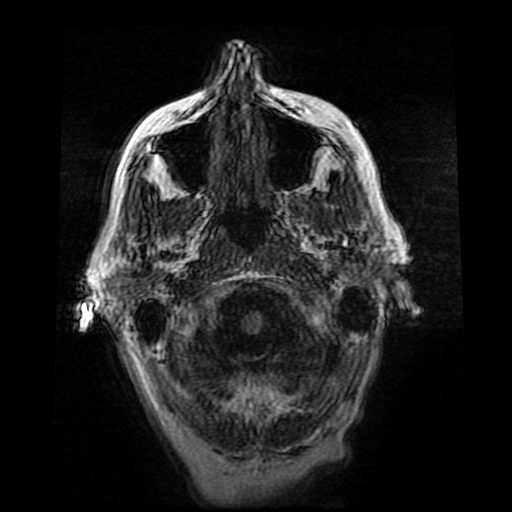

[Series 10: T2 · coronal · 5.0mm · 0.47mm/px · 3 of 35 slices shown (2 of 2)]
[im 1/35]
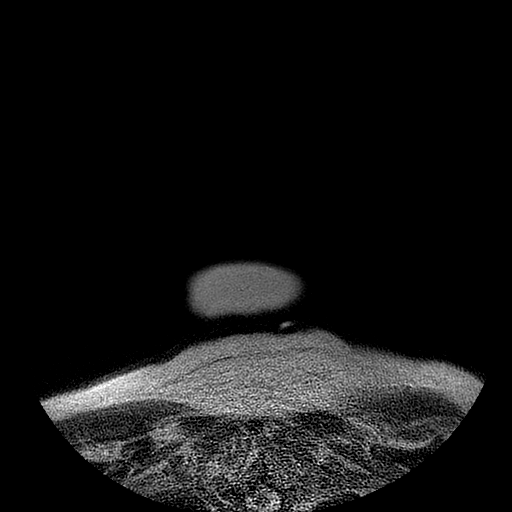
[im 18/35]
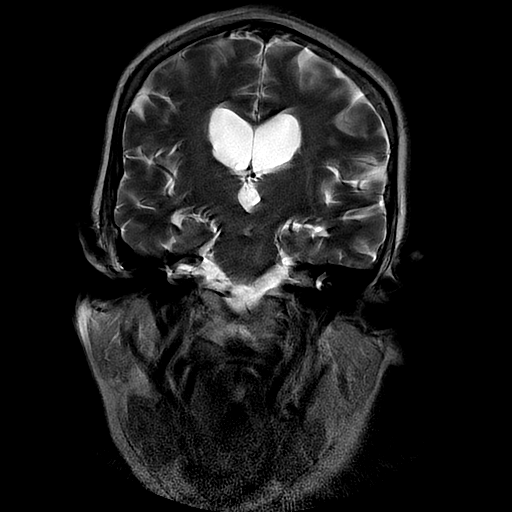
[im 35/35]
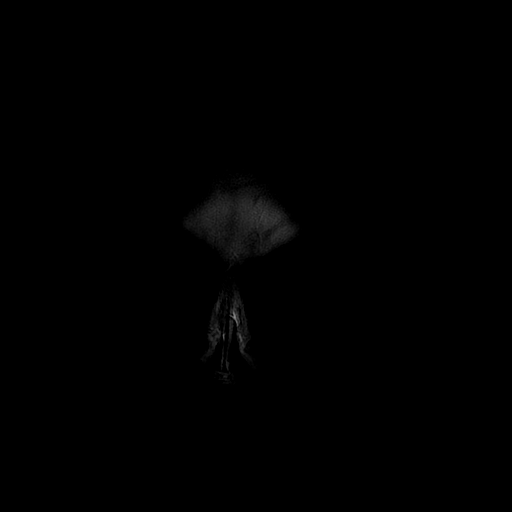

[Series 11: FLAIR · axial · 3.0mm · 0.47mm/px · z∈[-55,+99]mm · 2 of 28 slices shown (2 of 2)]
[im 1/28]
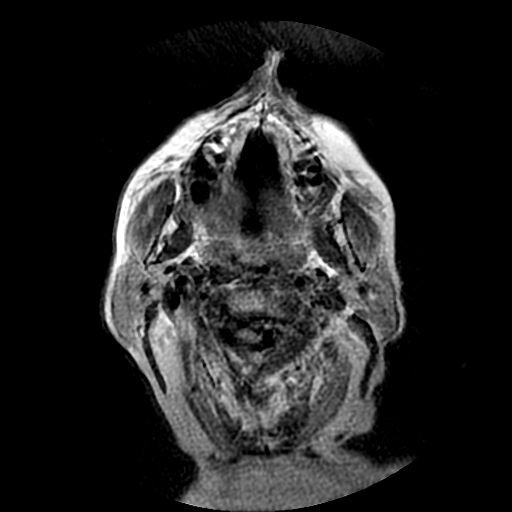
[im 28/28]
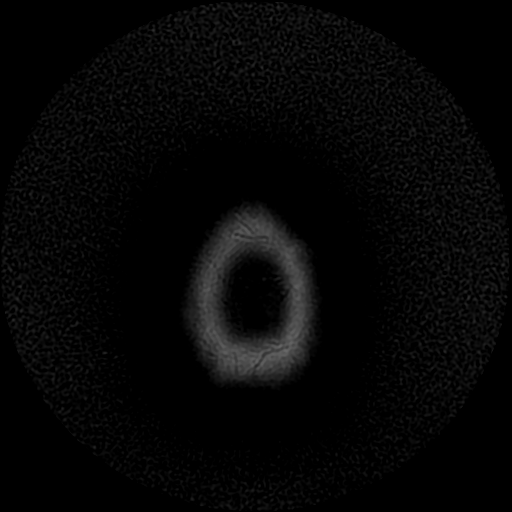

[Series 300: DWI · axial · 3.0mm · 1.02mm/px · z∈[-59,+90]mm · 5 of 53 slices shown (3 of 4)]
[im 1/53]
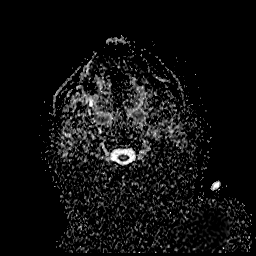
[im 14/53]
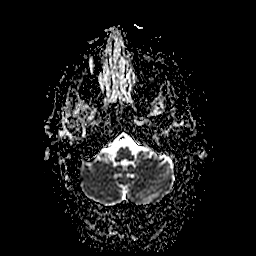
[im 27/53]
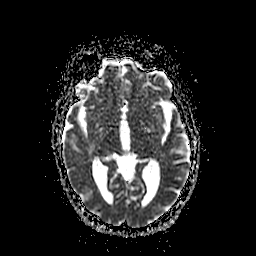
[im 40/53]
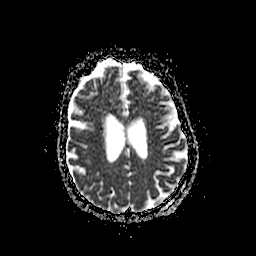
[im 53/53]
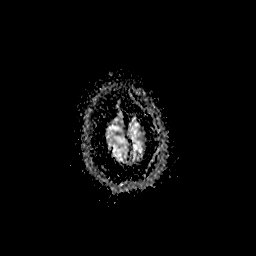

[Series 400: DWI · coronal · 5.0mm · 1.09mm/px · 3 of 40 slices shown (4 of 4)]
[im 1/40]
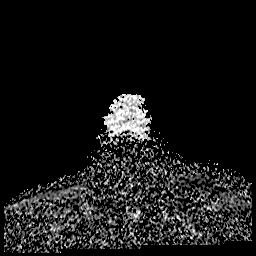
[im 20/40]
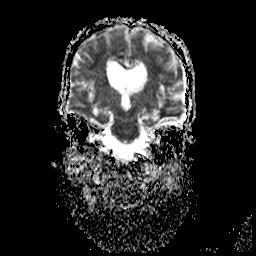
[im 40/40]
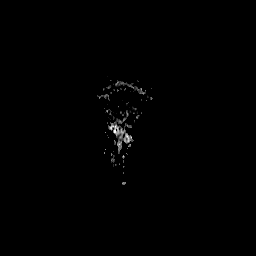

[37 of 48 positions shown; findings below may reference images not displayed]

FINDINGS: Brain: Negative for acute infarct. Scattered subcortical and deep
white matter hyperintensities similar to the prior MRI.

Generalized atrophy without hydrocephalus. Negative for hemorrhage
or mass. No midline shift.

Vascular: Normal arterial flow voids

Skull and upper cervical spine: Negative

Sinuses/Orbits: Mucosal edema paranasal sinuses. Bilateral cataract
surgery

Other: None
IMPRESSION: No acute abnormality

Atrophy and chronic white matter changes most likely microvascular
ischemia are stable from prior studies.

## 2019-04-04 IMAGING — DX DG ABD PORTABLE 1V
2 series · 2 of 2 positions shown · non-contrast
Comparison: None.

CLINICAL DATA: Abdominal pain

EXAM:
PORTABLE ABDOMEN - 1 VIEW

[abdomen kub (1 of 2)]
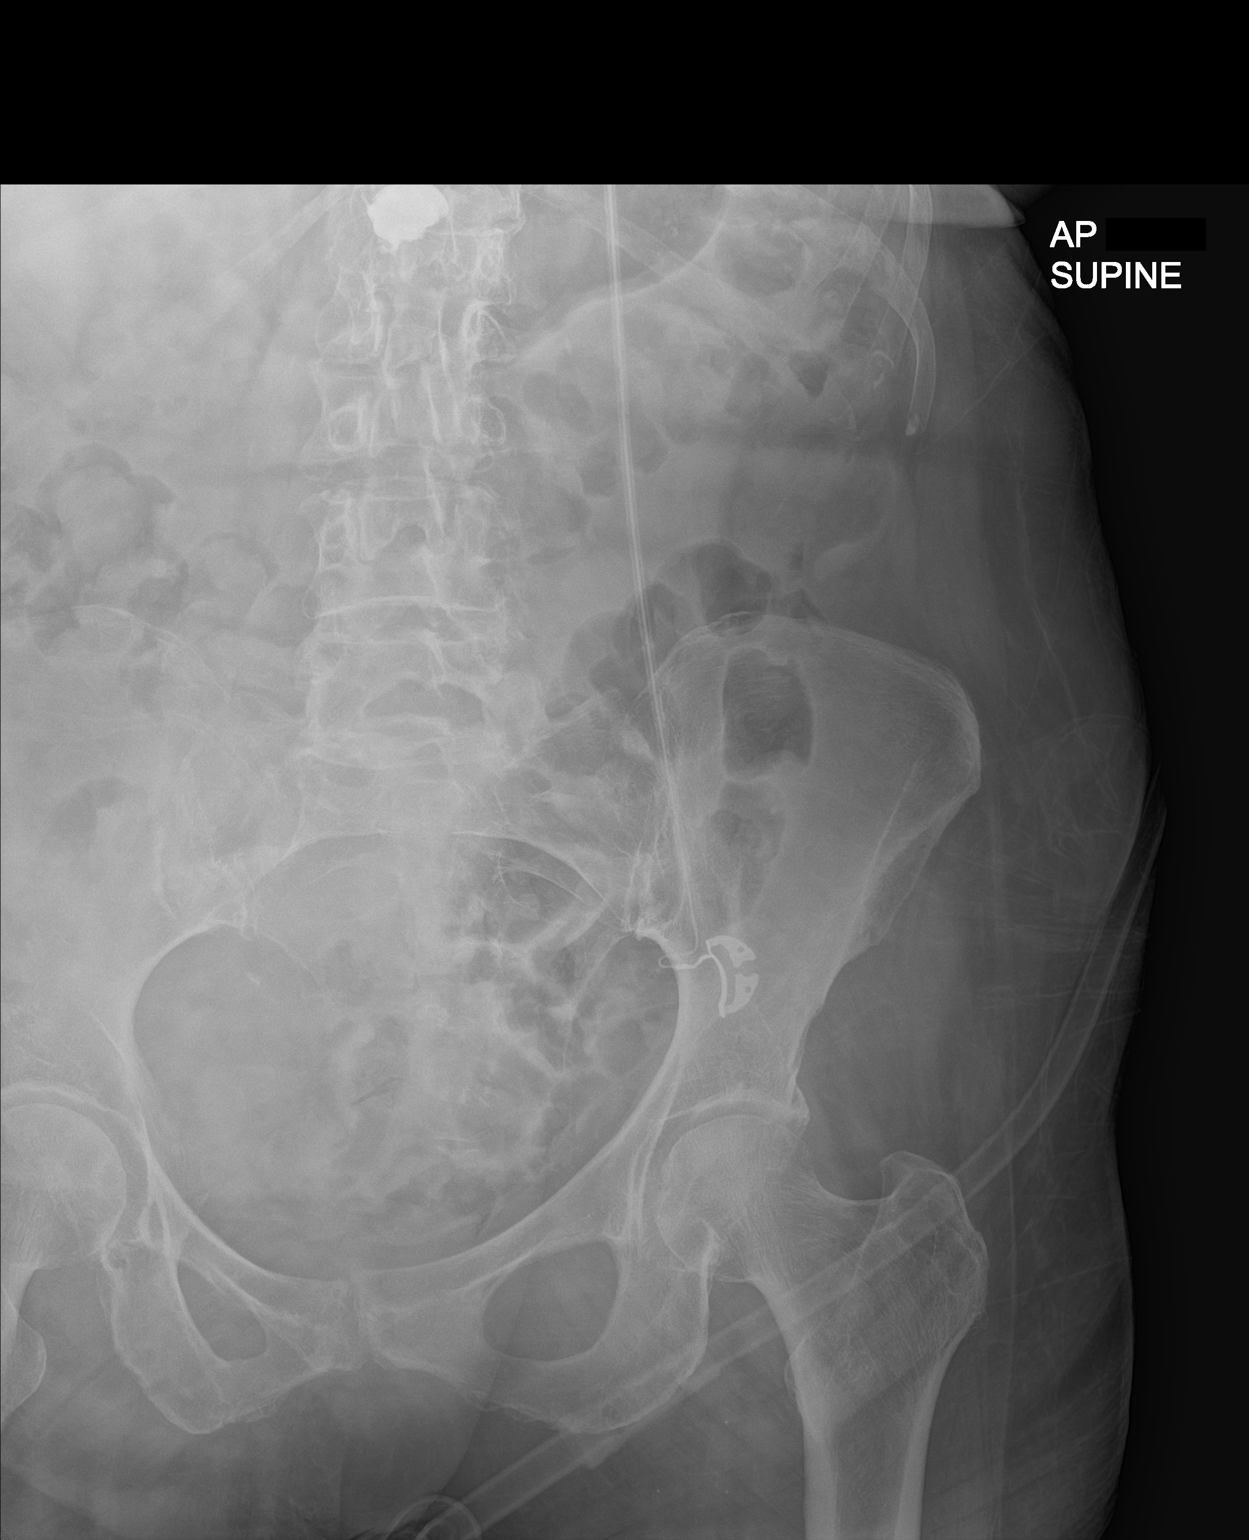

[abdomen kub (2 of 2)]
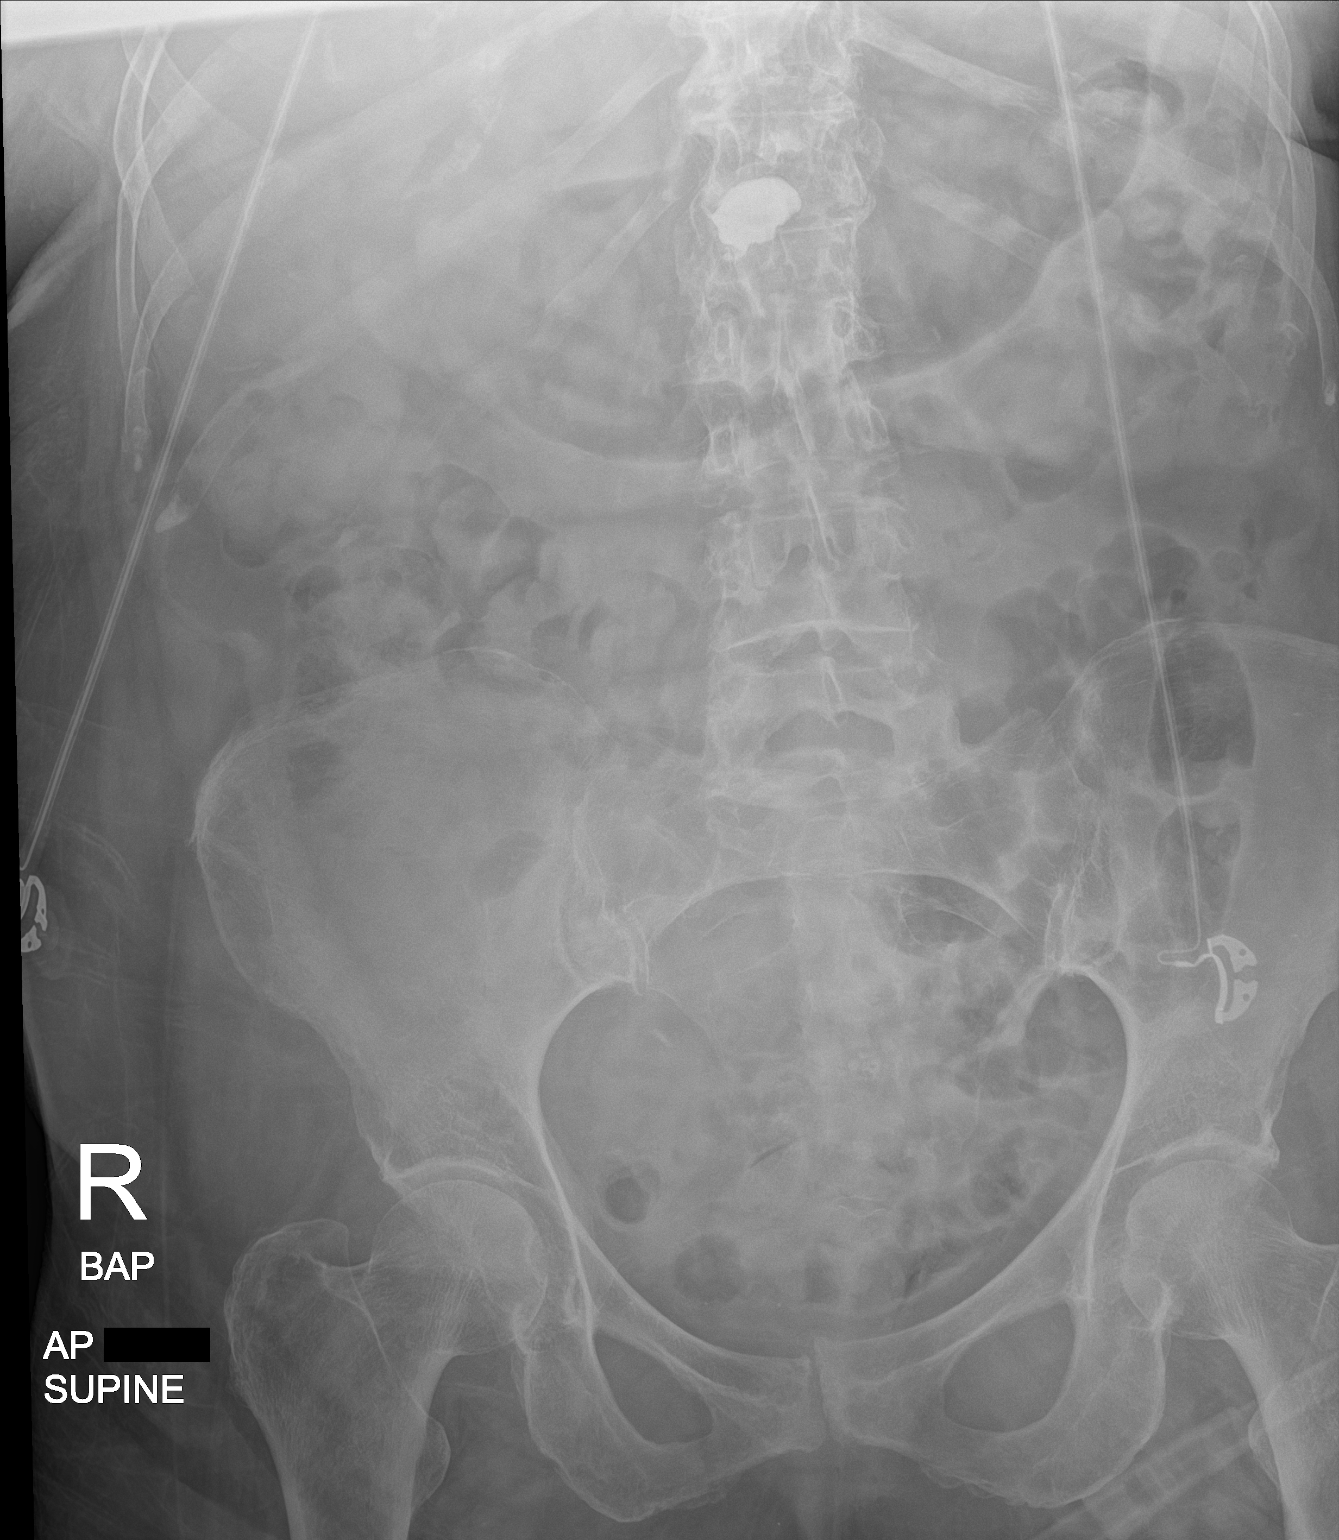

[2 of 2 positions shown; findings below may reference images not displayed]

FINDINGS: The bowel gas pattern is normal. No radio-opaque calculi or other
significant radiographic abnormality are seen.
IMPRESSION: Negative.

## 2019-04-04 IMAGING — DX DG LUMBAR SPINE 2-3V
2 series · 2 of 2 positions shown · non-contrast
Comparison: [DATE]

CLINICAL DATA: Fall

EXAM:
LUMBAR SPINE - 2-3 VIEW

[l-spine ap]
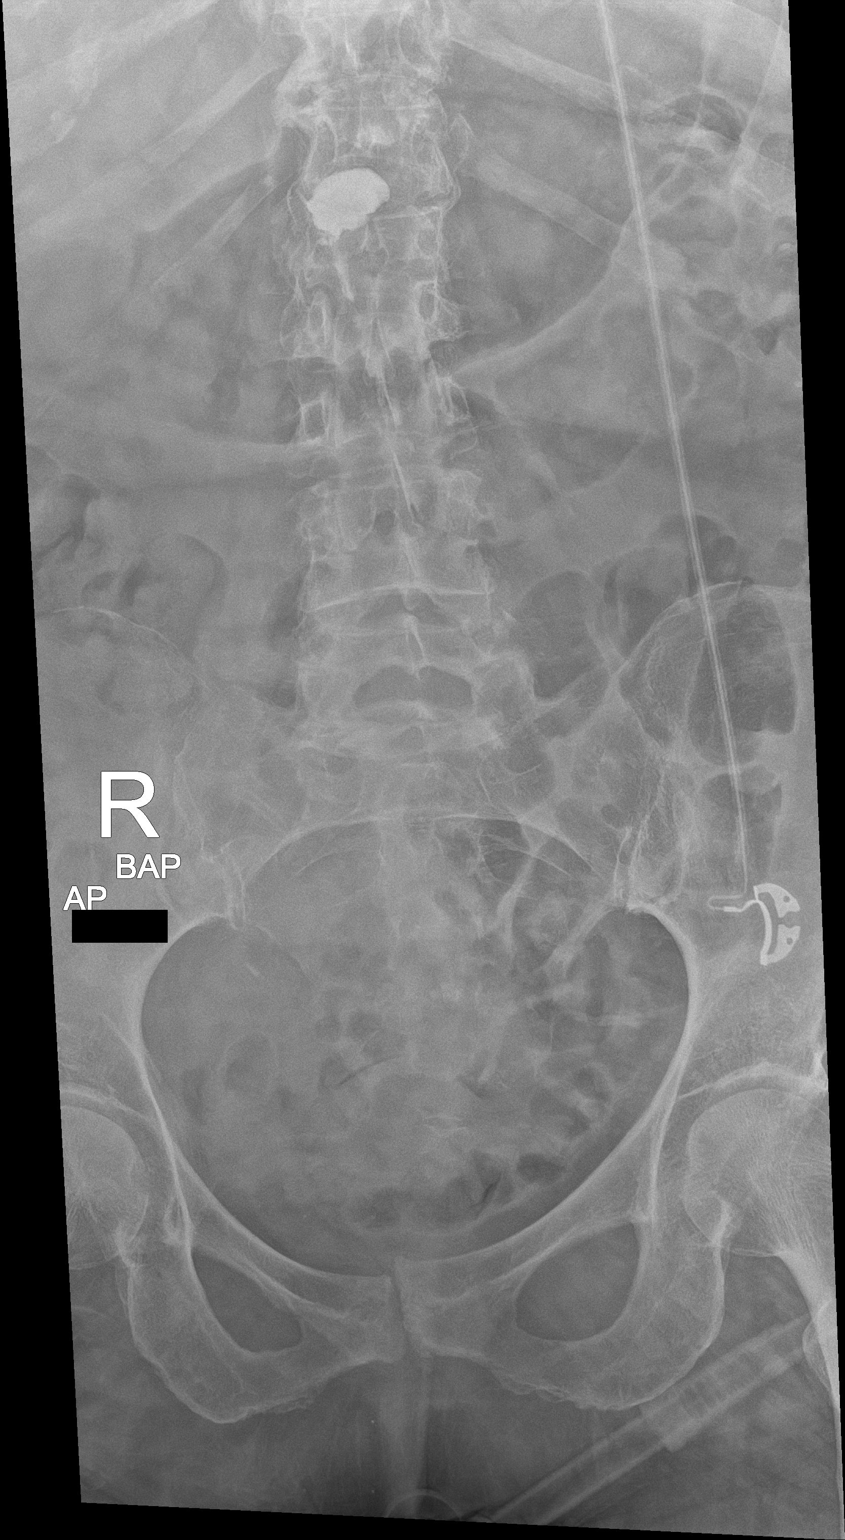

[l-spine lat]
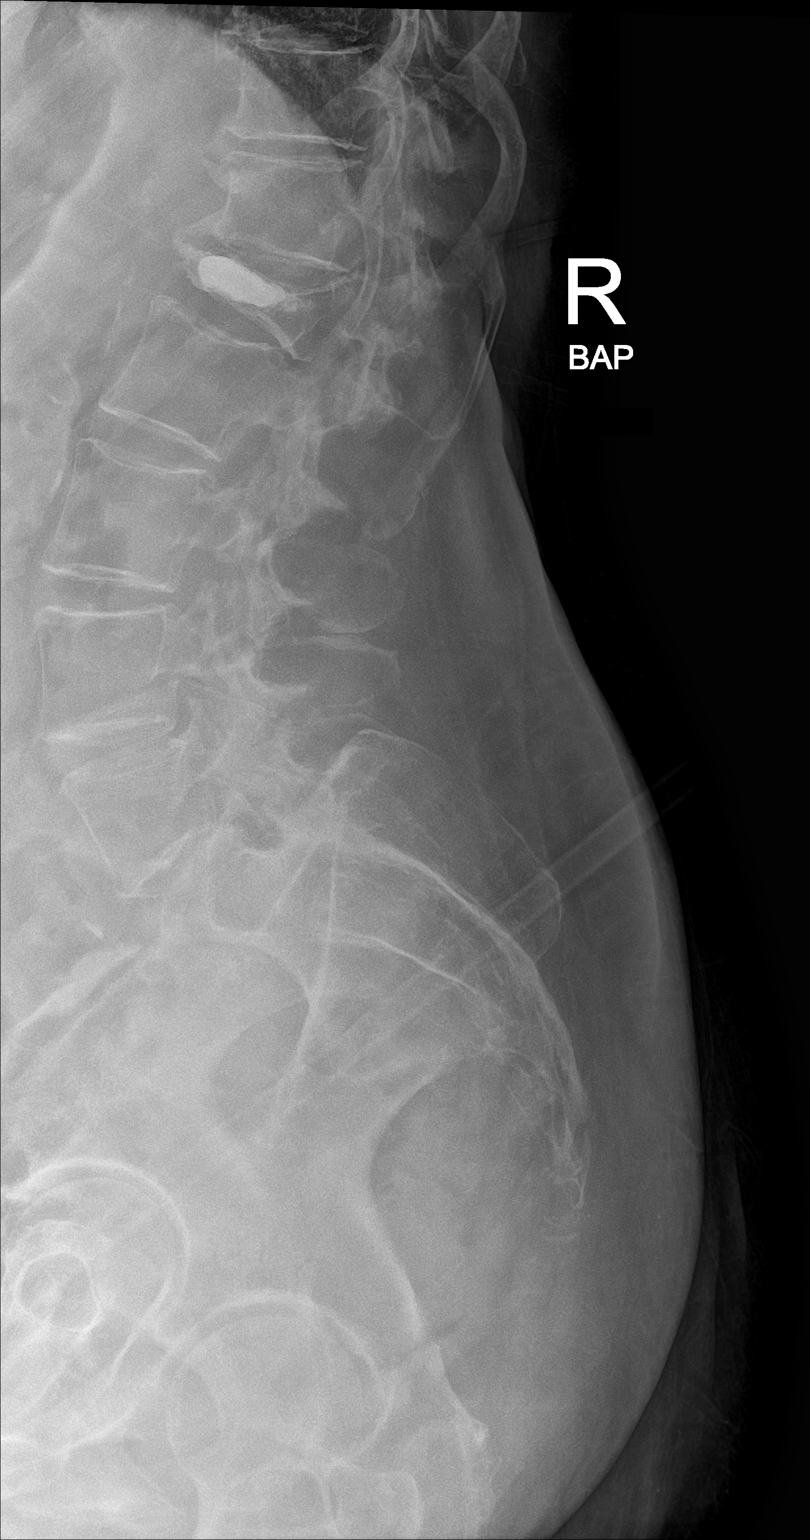

[2 of 2 positions shown; findings below may reference images not displayed]

FINDINGS: Chronic severe compression deformity at L1 with status post
vertebroplasty, stable. No acute fracture. Degenerative disc and
facet disease throughout the lumbar spine. No subluxation. SI joints
symmetric and unremarkable.
IMPRESSION: No acute bony abnormality.

## 2019-04-04 MED ORDER — LEVOTHYROXINE SODIUM 75 MCG PO TABS
75.0000 ug | ORAL_TABLET | Freq: Every day | ORAL | Status: DC
  Administered 2019-04-05: 75 ug via ORAL
  Filled 2019-04-04: qty 1

## 2019-04-04 MED ORDER — KETOROLAC TROMETHAMINE 15 MG/ML IJ SOLN
15.0000 mg | Freq: Two times a day (BID) | INTRAMUSCULAR | Status: AC
  Administered 2019-04-04 – 2019-04-09 (×10): 15 mg via INTRAVENOUS
  Filled 2019-04-04 (×10): qty 1

## 2019-04-04 MED ORDER — LORAZEPAM 2 MG/ML IJ SOLN
0.5000 mg | Freq: Once | INTRAMUSCULAR | Status: AC
  Administered 2019-04-04: 09:00:00 0.5 mg via INTRAVENOUS
  Filled 2019-04-04: qty 1

## 2019-04-04 MED ORDER — LABETALOL HCL 5 MG/ML IV SOLN: 10.0000 mg | INTRAVENOUS | Status: DC | PRN

## 2019-04-04 MED ORDER — LEVOTHYROXINE SODIUM 100 MCG/5ML IV SOLN
37.5000 ug | Freq: Every day | INTRAVENOUS | Status: DC
  Administered 2019-04-04: 17:00:00 37.5 ug via INTRAVENOUS
  Filled 2019-04-04: qty 5

## 2019-04-04 MED ORDER — MORPHINE SULFATE (PF) 2 MG/ML IV SOLN: 0.5000 mg | INTRAVENOUS | Status: DC | PRN

## 2019-04-04 MED ORDER — ESCITALOPRAM OXALATE 10 MG PO TABS
10.0000 mg | ORAL_TABLET | Freq: Every day | ORAL | Status: DC
  Administered 2019-04-04 – 2019-04-07 (×4): 10 mg via ORAL
  Filled 2019-04-04 (×4): qty 1

## 2019-04-04 MED ORDER — TRAMADOL HCL 50 MG PO TABS
50.0000 mg | ORAL_TABLET | Freq: Four times a day (QID) | ORAL | Status: DC | PRN
  Administered 2019-04-06 – 2019-04-10 (×4): 50 mg via ORAL
  Filled 2019-04-04 (×4): qty 1

## 2019-04-04 NOTE — Procedures (Addendum)
Patient Name: Debra Morgan  MRN: [id number]  Epilepsy Attending: Lora Havens  Referring Physician/Provider: Dr. Gean Birchwood Date: 04/04/2019 Duration: 27.12 mins  Patient history: 73 year old female with altered mental status.  EEG to evaluate for seizures.  Level of alertness: awake  AEDs during EEG study: None  Technical aspects: This EEG study was done with scalp electrodes positioned according to the 10-20 International system of electrode placement. Electrical activity was acquired at a sampling rate of 500Hz  and reviewed with a high frequency filter of 70Hz  and a low frequency filter of 1Hz . EEG data were recorded continuously and digitally stored.   DESCRIPTION:  The posterior dominant rhythm consists of 9-10 Hz activity of moderate voltage (25-35 uV) seen predominantly in posterior head regions, symmetric and reactive to eye opening and eye closing.  There is intermittent 3 to 5 Hz theta-delta slowing in left temporal region.  Physiologic photic driving was not seen during photic stimulation. Hyperventilation was not performed.  Abnormality -Intermittent slow, left temporal  IMPRESSION: This study is suggestive of nonspecific cortical dysfunction in left temporal region. No seizures or epileptiform discharges were seen throughout the recording.  Derriona Branscom Barbra Sarks

## 2019-04-04 NOTE — ED Notes (Signed)
Pt given dinner tray.

## 2019-04-04 NOTE — ED Notes (Signed)
[facility] ADMITTING PER RN CASEY

## 2019-04-04 NOTE — ED Notes (Signed)
Called by [facility] stating they had an emergency case that needed to go in front of pt. Pt is next in line per tech

## 2019-04-04 NOTE — ED Notes (Signed)
Pt stating she was having leg pain but attempted to come back and check on her with Tylenol but she was asleep. Has been asleep 3 times since checking back in

## 2019-04-04 NOTE — ED Notes (Signed)
Patient transported to [facility] by this RN  

## 2019-04-04 NOTE — Progress Notes (Signed)
EEG Completed; Results Pending  

## 2019-04-04 NOTE — ED Notes (Signed)
[name] hicks- [phone] called for pt update

## 2019-04-04 NOTE — Progress Notes (Addendum)
[facility]  PROGRESS NOTE  Debra Morgan ZOX:[id number] DOB: 14-Jul-1945 DOA: 04/03/2019 PCP: Laqueta Due., MD Admit date - 04/03/2019   Admitting Physician No admitting provider for patient encounter.  Outpatient Primary MD for the patient is Kristen Loader Tor Netters., MD  LOS - 0 Brief Narrative   Debra Morgan is a 73 y.o. year old female with medical history significant for HTN, HLD, hypothyroidism, movement disorder/dystonia, wheelchair-bound, depression, tested positive for Covid on 10/16 (in Care Everywhere), hospitalized at [facility] 10/22-10/26 and treated with Decadron for COVID-19 pneumonia without hypoxia, admitted at home 11/7 for generalized weakness, decreased mobility and transfers in the setting of chronic dystonia, peripheral neuropathy complicated by metabolic encephalopathy.  At that time decline in her physical functional status thought to be a consequence of her recent COVID-19 viral infection: Negative work-up for acute CVA including head CT, CTA.  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  BuSpar was discontinued.    She now presents with worsening confusion, agitation, answering questions inappropriately and not being oriented on 04/03/2019.   Subjective  Ms.  Morgan today is much more alert.  Oriented to person, place, time, context.  Complaining of pain all over most notably worse in lower back and right lower leg (worse than her chronic right leg pain), as well as abdominal pain.  A & P   Worsening encephalopathy in setting of recent COVID-19 infection, improving.  On my exam patient is alert, oriented x4.  Her symptoms tend to happen at night and early morning which seems suggestive of delirium.  No obvious signs of infection with negative UA, chest x-ray, afebrile, no white count.  No electrolyte abnormalities.   MRI brain shows atrophy and chronic microvascular ischemia but no acute changes and EEG with left temporal cortical dysfunction but no  signs of seizures.  Discussed with neurology who recommends outpatient neurology follow-up. Patient likely has vascular dementia and is prone to delirium but unclear what is causing delirium, continue neurochecks, delirium precautions overnight, avoid sedative-judicious use of IV pain control.  Generalized pain all over but greatest in lower extremities.  At baseline has decreased strength in right foot related to chronic dystonia but now has even worse right lower leg extremity, given unremarkable MRI brain will favor lumbar x-ray of spine to ensure no acute spine pathology that could explain deficit.  It is likely her pain is what is contributing to her waxing and waning mental status.,  Currently scheduling IV toradol to contain and will obtain x-ray of lumbar spine given persistent pain and change in strength. PT eval after imaging  Generalized abdominal pain.  AST ALT, T bili all unremarkable.  Mild nausea without vomiting.  Tolerating oral intake.  Urinary retention/constipation could contribute to delirium, will check abdominal x-ray.  Allow diet since no nausea/vomiting.  Dyspnea with minimal exertion.  Patient is not hypoxic, chest x-ray shows improvement in residual opacities.  Do not suspect new COVID 19 infection.  Patient not very active, doubt PE, D-dimer unremarkable venous duplex on 11/1 - for DVT.  Check BNP  Recent COVID-19 infection (10/19) with pneumonia.  No viral symptoms.  On room air.  Chest imaging shows improvement in bilateral airspace opacities.  Doubt new infection.  Repeat test was obtained in ED, currently pending D-dimer,  Hypothyroidism.  TSH 4.7 on admission.  Now tolerating oral intake will switch back to home Synthroid  Bradycardia.  Coreg was discontinued on previous hospitalization.  Monitor on telemetry  Chronic  movement disorder/dystonia.  Chronically wheelchair-bound.  Continue Cogentin  HTN.  Some mild relative hypotension with SBP's in the 100s on admission.   Currently normotensive.  Hold off on home Coreg, candesartan, Maxide.  Check orthostatics once able to return to baseline strength  HLD, continue statin  Severe depression with anxiety.  Lexapro 20 mg (increased on previous hospital stay by psychiatry) BuSpar      Family Communication  :  Spoke with husband and daughter on combined call on 11/16  Code Status :  Partial, discussed on day of admission  Disposition Plan  : Patient status changed from observation to imaging but mental status has improved patient is at high risk for recurrent delirium in setting of likely vascular dementia, unclear cause delirium suspect related to pain of unclear etiology, given abdominal pain and right leg deficit not explained by any acute changes on MRI warrant further imaging and continue IV pain control  Consults  : Neurology  Procedures  : EEG, 11/16, MRI brain 11/16  DVT Prophylaxis  : SCDs  Lab Results  Component Value Date   PLT 285 04/04/2019    Diet :  Diet Order    None       Inpatient Medications Scheduled Meds:  levothyroxine  37.5 mcg Intravenous Daily   Continuous Infusions:  sodium chloride 75 mL/hr at 04/04/19 0037   PRN Meds:.acetaminophen **OR** acetaminophen, labetalol, ondansetron **OR** ondansetron (ZOFRAN) IV  Antibiotics  :   Anti-infectives (From admission, onward)   None       Objective   Vitals:   04/04/19 0415 04/04/19 0530 04/04/19 0645 04/04/19 0700  BP:    120/69  Pulse: (!) 58 60 (!) 55 (!) 57  Resp: Temp:      TempSrc:      SpO2: 96% 96% 96% 97%  Weight:      Height:        SpO2: 97 %  Wt Readings from Last 3 Encounters:  04/03/19 75.6 kg  04/01/19 75.6 kg  03/25/19 75.6 kg    No intake or output data in the 24 hours ending 04/04/19 0818  Physical Exam:  Awake Alert, Oriented X4, Normal affect 1 out of 5 strength in right lower extremity, 2 out of 5 strength in left lower extremity, able to move upper extremities  against gravity No obvious JVD On room air, clear breath sounds bilaterally Abdomen soft, diminished bowel sounds, slightly tender with palpation, without rebound tenderness or guarding or rigidity Regular rate and rhythm, with no appreciable murmurs rubs or gallops, no peripheral edema     I have personally reviewed the following:   Data Reviewed:  CBC Recent Labs  Lab 03/31/19 1042 04/01/19 2100 04/03/19 1800 04/04/19 0120  WBC 5.7 5.3 5.5 4.7  HGB 11.9* 12.4 13.0 12.2  HCT 37.1 39.2 39.8 37.9  PLT 281 274 293 285  MCV 98.1 100.8* 98.0 99.0  MCH 31.5 31.9 32.0 31.9  MCHC 32.1 31.6 32.7 32.2  RDW 13.2 13.4 13.2 13.2  LYMPHSABS 1.5 1.4 1.5  --   MONOABS 0.5 0.6 0.6  --   EOSABS 0.1 0.3 0.2  --   BASOSABS 0.0 0.0 0.0  --     Chemistries  Recent Labs  Lab 03/31/19 1042 04/01/19 2100 04/03/19 1800 04/04/19 0120  NA 137 138 136 137  K 3.4* 3.8 3.7 3.6  CL 103 101 99 102  CO2 GLUCOSE 113* 112* 109* 111*  BUN 22 25* 16 14  CREATININE 0.90 0.98 0.86 0.71  CALCIUM 8.9 9.4 9.5 9.3  AST 20 19 17   --   ALT 42 34 25  --   ALKPHOS 77 92 82  --   BILITOT 0.7 0.4 0.7  --    ------------------------------------------------------------------------------------------------------------------ No results for input(s): CHOL, HDL, LDLCALC, TRIG, CHOLHDL, LDLDIRECT in the last 72 hours.  No results found for: HGBA1C ------------------------------------------------------------------------------------------------------------------ Recent Labs    04/03/19 1800  TSH 4.761*   ------------------------------------------------------------------------------------------------------------------ No results for input(s): VITAMINB12, FOLATE, FERRITIN, TIBC, IRON, RETICCTPCT in the last 72 hours.  Coagulation profile No results for input(s): INR, PROTIME in the last 168 hours.  Recent Labs    04/04/19 0120  DDIMER 0.40    Cardiac Enzymes No results for input(s):  CKMB, TROPONINI, MYOGLOBIN in the last 168 hours.  Invalid input(s): CK ------------------------------------------------------------------------------------------------------------------    Component Value Date/Time   BNP 119.6 (H) 03/17/2019 0450    Micro Results Recent Results (from the past 240 hour(s))  SARS CORONAVIRUS 2 (TAT 6-24 HRS) Nasopharyngeal Nasopharyngeal Swab     Status: Abnormal   Collection Time: 04/01/19  8:33 AM   Specimen: Nasopharyngeal Swab  Result Value Ref Range Status   SARS Coronavirus 2 POSITIVE (A) NEGATIVE Final    Comment: RESULT CALLED TO, READ BACK BY AND VERIFIED WITH: 04/03/19, ED RN BY EMAIL AT 1621 ON 04/01/19 BY C. JESSUP, MT. (NOTE) SARS-CoV-2 target nucleic acids are DETECTED. The SARS-CoV-2 RNA is generally detectable in upper and lower respiratory specimens during the acute phase of infection. Positive results are indicative of active infection with SARS-CoV-2. Clinical  correlation with patient history and other diagnostic information is necessary to determine patient infection status. Positive results do  not rule out bacterial infection or co-infection with other viruses. The expected result is Negative. Fact Sheet for Patients: 04/03/19 Fact Sheet for Healthcare Providers: HairSlick.no This test is not yet approved or cleared by the quierodirigir.com FDA and  has been authorized for detection and/or diagnosis of SARS-CoV-2 by FDA under an Emergency Use Authorization (EUA). This EUA will remain  in effect (meaning  this test can be used) for the duration of the COVID-19 declaration under Section 564(b)(1) of the Act, 21 U.S.C. section 360bbb-3(b)(1), unless the authorization is terminated or revoked sooner. Performed at [facility], [address] [place], [place]   Urine culture     Status: None   Collection Time: 04/01/19  8:23 PM   Specimen: Urine,  Clean Catch  Result Value Ref Range Status   Specimen Description   Final    URINE, CLEAN CATCH Performed at [facility], [address], [place], [place]    Special Requests   Final    NONE Performed at [facility], [address], [place], [place]    Culture   Final    Multiple bacterial morphotypes present, none predominant. Suggest appropriate recollection if clinically indicated.   Report Status 04/03/2019 FINAL  Final    Radiology Reports Ct Code Stroke Cta Head W/wo Contrast  Result Date: 03/25/2019 CLINICAL DATA:  Altered mental status.  Hemorrhage suspected EXAM: CT ANGIOGRAPHY HEAD AND NECK TECHNIQUE: Multidetector CT imaging of the head and neck was performed using the standard protocol during bolus administration of intravenous contrast. Multiplanar CT image reconstructions and MIPs were obtained to evaluate the vascular anatomy. Carotid stenosis measurements (when applicable) are obtained utilizing NASCET criteria, using the  distal internal carotid diameter as the denominator. CONTRAST:  80mL OMNIPAQUE IOHEXOL 350 MG/ML SOLN COMPARISON:  Head CT from earlier today FINDINGS: CTA NECK FINDINGS Aortic arch: Atherosclerotic plaque.  Three vessel branching. Right carotid system: Tortuous ICA. Vessels are smooth and widely patent. No atheromatous changes. Left carotid system: Atherosclerotic calcification about the bifurcation that is mild for age. No stenosis or ulceration. Vertebral arteries: No brachiocephalic or proximal subclavian flow limiting stenosis. Strong dominance of the right vertebral artery which is smooth and widely patent to the dura. A diminutive left vertebral artery is also patent to the dura although most flow is into the left occipital soft tissues. Skeleton: No acute or aggressive finding Other neck: Negative Upper chest: Patchy airspace disease in the upper lungs correlating with history of COVID-19  positivity Review of the MIP images confirms the above findings CTA HEAD FINDINGS Anterior circulation: Vessels are smooth and widely patent. No branch occlusion, beading, or aneurysm. Posterior circulation: Strong right vertebral artery dominance. A tiny left vertebral artery does reach the basilar. Aplastic right and hypoplastic left P1 segment. No branch occlusion, beading, or aneurysm Venous sinuses: Patent Anatomic variants: As above Review of the MIP images confirms the above findings IMPRESSION: 1. No emergent vascular finding. 2. Biapical airspace disease correlating with history of COVID-19 positivity. 3. Mild for age atherosclerosis. Electronically Signed   By: Marnee Spring M.D.   On: 03/25/2019 05:15   Ct Head Wo Contrast  Result Date: 03/31/2019 CLINICAL DATA:  Altered mental status EXAM: CT HEAD WITHOUT CONTRAST TECHNIQUE: Contiguous axial images were obtained from the base of the skull through the vertex without intravenous contrast. COMPARISON:  03/25/2019 FINDINGS: Brain: No evidence of acute infarction, hemorrhage, hydrocephalus, extra-axial collection or mass lesion/mass effect. Periventricular and deep white matter hypodensity. Vascular: No hyperdense vessel or unexpected calcification. Skull: Normal. Negative for fracture or focal lesion. Sinuses/Orbits: No acute finding. Other: None. IMPRESSION: No acute intracranial pathology.  Small-vessel white matter disease. Electronically Signed   By: Lauralyn Primes M.D.   On: 03/31/2019 13:52   Ct Code Stroke Cta Neck W/wo Contrast  Result Date: 03/25/2019 CLINICAL DATA:  Altered mental status.  Hemorrhage suspected EXAM: CT ANGIOGRAPHY HEAD AND NECK TECHNIQUE: Multidetector CT imaging of the head and neck was performed using the standard protocol during bolus administration of intravenous contrast. Multiplanar CT image reconstructions and MIPs were obtained to evaluate the vascular anatomy. Carotid stenosis measurements (when applicable) are  obtained utilizing NASCET criteria, using the distal internal carotid diameter as the denominator. CONTRAST:  80mL OMNIPAQUE IOHEXOL 350 MG/ML SOLN COMPARISON:  Head CT from earlier today FINDINGS: CTA NECK FINDINGS Aortic arch: Atherosclerotic plaque.  Three vessel branching. Right carotid system: Tortuous ICA. Vessels are smooth and widely patent. No atheromatous changes. Left carotid system: Atherosclerotic calcification about the bifurcation that is mild for age. No stenosis or ulceration. Vertebral arteries: No brachiocephalic or proximal subclavian flow limiting stenosis. Strong dominance of the right vertebral artery which is smooth and widely patent to the dura. A diminutive left vertebral artery is also patent to the dura although most flow is into the left occipital soft tissues. Skeleton: No acute or aggressive finding Other neck: Negative Upper chest: Patchy airspace disease in the upper lungs correlating with history of COVID-19 positivity Review of the MIP images confirms the above findings CTA HEAD FINDINGS Anterior circulation: Vessels are smooth and widely patent. No branch occlusion, beading, or aneurysm. Posterior circulation: Strong right vertebral artery dominance. A tiny left vertebral artery  does reach the basilar. Aplastic right and hypoplastic left P1 segment. No branch occlusion, beading, or aneurysm Venous sinuses: Patent Anatomic variants: As above Review of the MIP images confirms the above findings IMPRESSION: 1. No emergent vascular finding. 2. Biapical airspace disease correlating with history of COVID-19 positivity. 3. Mild for age atherosclerosis. Electronically Signed   By: Marnee SpringJonathon  Watts M.D.   On: 03/25/2019 05:15   Ct Angio Chest Pe W And/or Wo Contrast  Result Date: 03/16/2019 CLINICAL DATA:  Shortness of breath, COVID-19 positive. EXAM: CT ANGIOGRAPHY CHEST WITH CONTRAST TECHNIQUE: Multidetector CT imaging of the chest was performed using the standard protocol during  bolus administration of intravenous contrast. Multiplanar CT image reconstructions and MIPs were obtained to evaluate the vascular anatomy. CONTRAST:  75mL OMNIPAQUE IOHEXOL 350 MG/ML SOLN COMPARISON:  CTA November 04, 2018 FINDINGS: Cardiovascular: Satisfactory opacification of the pulmonary arteries to the lobar level. More distal evaluation limited by respiratory motion and underlying parenchymal disease. No pulmonary artery filling defects are seen to the lobar level. Pulmonary arterial caliber is upper limits normal, unchanged from comparison. Atherosclerotic plaque of the nonaneurysmal thoracic aorta. Normal 3 vessel branching of the arch with scant calcification in the proximal great vessels. Mild cardiomegaly with biatrial enlargement. No pericardial effusion. No elevation of the RV/LV ratio (0.75). Mediastinum/Nodes: No enlarged mediastinal, hilar or axillary lymph nodes. Thyroid gland, trachea, and esophagus demonstrate no significant findings. Lungs/Pleura: Multifocal areas peripheral predominant ground-glass are present in the lungs. No pneumothorax or effusion. Mild septal thickening noted in the apices and bases. Diffuse airways thickening and scattered secretions. Upper Abdomen: Non rotation of both kidneys, anatomic variant. Partial fatty replacement of the pancreas. Slight reflux of contrast into the hepatic veins. Musculoskeletal: Multilevel degenerative changes are present in the imaged portions of the spine. No acute osseous abnormality or suspicious osseous lesion. No concerning chest wall lesions S-shaped curvature of the thoracolumbar spine vertebroplasty changes at L1, partially visualized. Review of the MIP images confirms the above findings. IMPRESSION: 1. No evidence of pulmonary embolism to the lobar level. More distal evaluation limited by respiratory motion and underlying parenchymal disease. 2. Multifocal areas of peripheral predominant ground-glass in the lungs, compatible with atypical  infection in the setting of known COVID-19 positivity. 3. Some septal thickening in the apices may reflect superimposed edema. 4. Aortic Atherosclerosis (ICD10-I70.0). Electronically Signed   By: Kreg ShropshirePrice  DeHay M.D.   On: 03/16/2019 21:15   Ct Abdomen Pelvis W Contrast  Result Date: 03/31/2019 CLINICAL DATA:  Generalized abdominal pain.  Fever and confusion. EXAM: CT ABDOMEN AND PELVIS WITH CONTRAST TECHNIQUE: Multidetector CT imaging of the abdomen and pelvis was performed using the standard protocol following bolus administration of intravenous contrast. CONTRAST:  100mL OMNIPAQUE IOHEXOL 300 MG/ML  SOLN COMPARISON:  Chest CT 03/16/2019 FINDINGS: Lower chest: Patchy E bibasilar infiltrates and scattered small pulmonary nodules. No pleural effusions. The heart is borderline enlarged. No pericardial effusion. Coronary artery and aortic calcifications are noted. Hepatobiliary: No focal hepatic lesions or intrahepatic biliary dilatation. The gallbladder appears normal. No common bile duct dilatation. Pancreas: No mass, inflammation or ductal dilatation. Spleen: Normal size.  No focal lesions. Adrenals/Urinary Tract: The adrenal glands are normal. No renal, ureteral or bladder calculi or mass. Mild distention of the bladder. Stomach/Bowel: The stomach, duodenum, small bowel and colon are unremarkable. No acute inflammatory changes, mass lesions or obstructive findings. The terminal ileum and appendix are normal. Vascular/Lymphatic: Moderate atherosclerotic calcifications involving the aorta and branch vessels but no aneurysm. The  major venous structures are patent. No mesenteric or retroperitoneal mass or adenopathy. Small scattered lymph nodes are noted. Reproductive: Uterine fibroids are noted. The endometrium is borderline measuring 7.5 mm. Follow-up pelvic ultrasound examination is suggested. Both ovaries appear normal. Other: No free pelvic fluid collections. No inguinal mass or adenopathy. Musculoskeletal: No  significant bony findings. Vertebral augmentation changes are noted at L1 with moderate retropulsion. Osteoporosis is noted. IMPRESSION: 1. No acute abdominal/pelvic findings, mass lesions or adenopathy. 2. Patchy bilateral lower lobe infiltrates and a few small pulmonary nodules. This appears slightly progressive when compared to the prior CT scan where most of the infiltrates or upper lobe predominant. The small nodules are likely inflammatory. 3. Slightly prominent endometrium for age. Recommend follow-up pelvic ultrasound examination. Uterine fibroids are noted. The ovaries are normal. 4. Stable vertebral augmentation changes at L1. Electronically Signed   By: Marijo Sanes M.D.   On: 03/31/2019 14:04   Dg Chest Portable 1 View  Result Date: 04/01/2019 CLINICAL DATA:  COVID positive. Chest pain, shortness of breath, cough EXAM: PORTABLE CHEST 1 VIEW COMPARISON:  03/31/2019 FINDINGS: Patchy airspace disease in the right upper lobe and left lower lobe have improved. Mild residual opacities remain in both areas, most pronounced at the left base. No effusions. Heart is borderline in size. No acute bony abnormality. IMPRESSION: Improving bilateral airspace opacities. Electronically Signed   By: Rolm Baptise M.D.   On: 04/01/2019 20:17   Dg Chest Port 1 View  Result Date: 03/31/2019 CLINICAL DATA:  Altered level of consciousness and weakness. Coronavirus infection. EXAM: PORTABLE CHEST 1 VIEW COMPARISON:  03/29/2019 FINDINGS: Heart size is normal. Chronic aortic atherosclerosis. Patchy bilateral perihilar pulmonary density could represent sequela of viral pneumonia. No evidence of dense consolidation or lobar collapse. No visible effusion. No change since the study of 2 days ago. IMPRESSION: Patchy perihilar density could represent viral pneumonia. No significant change since 2 days ago. Electronically Signed   By: Nelson Chimes M.D.   On: 03/31/2019 14:43   Dg Chest Port 1 View  Result Date:  03/25/2019 CLINICAL DATA:  Altered mental status EXAM: PORTABLE CHEST 1 VIEW COMPARISON:  01/14/2019 FINDINGS: Lower volumes and increased patchy bilateral pulmonary opacity. Cardiomegaly and vascular pedicle widening accentuated by rotation and lower volumes. No visible effusion or pneumothorax IMPRESSION: Patchy bilateral infiltrate correlating with history of COVID-19 positivity. Opacity and inflation has worsened since 01/14/2019. Electronically Signed   By: Monte Fantasia M.D.   On: 03/25/2019 05:36   Dg Chest Portable 1 View  Result Date: 03/16/2019 CLINICAL DATA:  Initial evaluation for acute cough, weakness, OB positive. EXAM: PORTABLE CHEST 1 VIEW COMPARISON:  Prior radiograph from 11/11/2018. FINDINGS: Cardiac and mediastinal silhouettes within normal limits. Lungs mildly hypoinflated. Linear atelectatic changes noted within the right perihilar region, lingula, and left lung base. No other focal airspace disease. No edema or effusion. No pneumothorax. No acute osseous finding. IMPRESSION: 1. Mild scattered subsegmental atelectasis within the right perihilar region and left lung base. 2. No other radiographic evidence for active cardiopulmonary disease. Electronically Signed   By: Jeannine Boga M.D.   On: 03/16/2019 19:15   Ct Head Code Stroke Wo Contrast  Result Date: 03/25/2019 CLINICAL DATA:  Code stroke. Altered mental status with unclear cause EXAM: CT HEAD WITHOUT CONTRAST TECHNIQUE: Contiguous axial images were obtained from the base of the skull through the vertex without intravenous contrast. COMPARISON:  03/10/2019 FINDINGS: Brain: No evidence of acute infarction, hemorrhage, hydrocephalus, extra-axial collection or mass lesion/mass  effect. Mild patchy low-density in the cerebral white matter attributed to chronic small vessel ischemia. Central predominant cerebral volume loss. Vascular: No hyperdense vessel or unexpected calcification. Skull: Normal. Negative for fracture or  focal lesion. Sinuses/Orbits: Negative Other: Page was placed 03/25/2019 at 4:45 am to provider [facility] DAVID . ASPECTS [facility] Stroke Program Early CT Score) Not scored without localizing symptoms IMPRESSION: 1. No acute finding. 2. Mild chronic small vessel disease. Electronically Signed   By: Marnee Spring M.D.   On: 03/25/2019 04:47   Vas [place] Lower Extremity Venous (dvt)  Result Date: 03/20/2019  Lower Venous Study Indications: Elevated Ddimer.  Risk Factors: COVID 19 positive. Limitations: Poor ultrasound/tissue interface. Comparison Study: No prior studies. Performing Technologist: Chanda Busing RVT  Examination Guidelines: A complete evaluation includes B-mode imaging, spectral Doppler, color Doppler, and power Doppler as needed of all accessible portions of each vessel. Bilateral testing is considered an integral part of a complete examination. Limited examinations for reoccurring indications may be performed as noted.  +---------+---------------+---------+-----------+----------+--------------+  RIGHT     Compressibility Phasicity Spontaneity Properties Thrombus Aging  +---------+---------------+---------+-----------+----------+--------------+  CFV       Full            Yes       Yes                                    +---------+---------------+---------+-----------+----------+--------------+  SFJ       Full                                                             +---------+---------------+---------+-----------+----------+--------------+  FV Prox   Full                                                             +---------+---------------+---------+-----------+----------+--------------+  FV Mid    Full                                                             +---------+---------------+---------+-----------+----------+--------------+  FV Distal Full                                                             +---------+---------------+---------+-----------+----------+--------------+  PFV        Full                                                             +---------+---------------+---------+-----------+----------+--------------+  POP  Full            Yes       Yes                                    +---------+---------------+---------+-----------+----------+--------------+  PTV       Full                                                             +---------+---------------+---------+-----------+----------+--------------+  PERO      Full                                                             +---------+---------------+---------+-----------+----------+--------------+   +---------+---------------+---------+-----------+----------+--------------+  LEFT      Compressibility Phasicity Spontaneity Properties Thrombus Aging  +---------+---------------+---------+-----------+----------+--------------+  CFV       Full            Yes       Yes                                    +---------+---------------+---------+-----------+----------+--------------+  SFJ       Full                                                             +---------+---------------+---------+-----------+----------+--------------+  FV Prox   Full                                                             +---------+---------------+---------+-----------+----------+--------------+  FV Mid    Full                                                             +---------+---------------+---------+-----------+----------+--------------+  FV Distal Full                                                             +---------+---------------+---------+-----------+----------+--------------+  PFV       Full                                                             +---------+---------------+---------+-----------+----------+--------------+  POP       Full            Yes       Yes                                    +---------+---------------+---------+-----------+----------+--------------+  PTV       Full                                                              +---------+---------------+---------+-----------+----------+--------------+  PERO      Full                                                             +---------+---------------+---------+-----------+----------+--------------+     Summary: Right: There is no evidence of deep vein thrombosis in the lower extremity. No cystic structure found in the popliteal fossa. Left: There is no evidence of deep vein thrombosis in the lower extremity. No cystic structure found in the popliteal fossa.  *See table(s) above for measurements and observations. Electronically signed by Waverly Ferrari MD on 03/20/2019 at 11:30:52 AM.    Final      Time Spent in minutes  30     Laverna Peace M.D on 04/04/2019 at 8:18 AM  To page go to [email or web address] - password [facility]

## 2019-04-04 NOTE — ED Notes (Signed)
Family at bedside. 

## 2019-04-04 NOTE — ED Notes (Signed)
Ativan given, MRI made aware

## 2019-04-04 NOTE — ED Notes (Signed)
MRI tech stated pt was next in line

## 2019-04-05 DIAGNOSIS — R441 Visual hallucinations: Secondary | ICD-10-CM

## 2019-04-05 LAB — COMPREHENSIVE METABOLIC PANEL
ALT: 19 U/L (ref 0–44)
AST: 15 U/L (ref 15–41)
Albumin: 3 g/dL — ABNORMAL LOW (ref 3.5–5.0)
Alkaline Phosphatase: 71 U/L (ref 38–126)
Anion gap: 14 (ref 5–15)
BUN: 23 mg/dL (ref 8–23)
CO2: 23 mmol/L (ref 22–32)
Calcium: 9.2 mg/dL (ref 8.9–10.3)
Chloride: 101 mmol/L (ref 98–111)
Creatinine, Ser: 0.98 mg/dL (ref 0.44–1.00)
GFR calc Af Amer: >60 mL/min (ref >60)
GFR calc non Af Amer: 57 mL/min — ABNORMAL LOW (ref >60)
Glucose, Bld: 95 mg/dL (ref 70–99)
Potassium: 4.1 mmol/L (ref 3.5–5.1)
Sodium: 138 mmol/L (ref 135–145)
Total Bilirubin: 0.7 mg/dL (ref 0.3–1.2)
Total Protein: 6.1 g/dL — ABNORMAL LOW (ref 6.5–8.1)

## 2019-04-05 LAB — CBC
HCT: 35.6 % — ABNORMAL LOW (ref 36.0–46.0)
Hemoglobin: 11.7 g/dL — ABNORMAL LOW (ref 12.0–15.0)
MCH: 32.1 pg (ref 26.0–34.0)
MCHC: 32.9 g/dL (ref 30.0–36.0)
MCV: 97.5 fL (ref 80.0–100.0)
Platelets: 285 10*3/uL (ref 150–400)
RBC: 3.65 MIL/uL — ABNORMAL LOW (ref 3.87–5.11)
RDW: 13.1 % (ref 11.5–15.5)
WBC: 5.8 10*3/uL (ref 4.0–10.5)
nRBC: 0 % (ref 0.0–0.2)

## 2019-04-05 MED ORDER — LEVOTHYROXINE SODIUM 100 MCG PO TABS
100.0000 ug | ORAL_TABLET | Freq: Every day | ORAL | Status: DC
  Administered 2019-04-06 – 2019-04-12 (×7): 100 ug via ORAL
  Filled 2019-04-05 (×7): qty 1

## 2019-04-05 MED ORDER — ADULT MULTIVITAMIN W/MINERALS CH
1.0000 | ORAL_TABLET | Freq: Every day | ORAL | Status: DC
  Administered 2019-04-05 – 2019-04-12 (×8): 1 via ORAL
  Filled 2019-04-05 (×8): qty 1

## 2019-04-05 MED ORDER — ENSURE ENLIVE PO LIQD
237.0000 mL | Freq: Two times a day (BID) | ORAL | Status: DC
  Administered 2019-04-05 – 2019-04-12 (×13): 237 mL via ORAL

## 2019-04-05 NOTE — Progress Notes (Signed)
Initial Nutrition Assessment  INTERVENTION:   -Ensure Enlive po BID, each supplement provides 350 kcal and 20 grams of protein -Multivitamin with minerals daily  NUTRITION DIAGNOSIS:   Increased nutrient needs related to acute illness as evidenced by estimated needs.  GOAL:   Patient will meet greater than or equal to 90% of their needs  MONITOR:   PO intake, Supplement acceptance, Labs, Weight trends, I & O's  REASON FOR ASSESSMENT:   Malnutrition Screening Tool    ASSESSMENT:   73 y.o. year old female with medical history significant for HTN, HLD, hypothyroidism, movement disorder/dystonia, wheelchair-bound, depression, tested positive for Covid on 10/16 (in [place]), hospitalized at [facility] 10/22-10/26 and treated with Decadron for COVID-19 pneumonia without hypoxia, admitted at home 11/7 for generalized weakness, decreased mobility and transfers in the setting of chronic dystonia, peripheral neuropathy complicated by metabolic encephalopathy.  At that time decline in her physical functional status thought to be a consequence of her recent COVID-19 viral infection: Negative work-up for acute CVA including head CT, CTA.  **RD working remotely**  Patient continues to test positive for COVID-19, initial diagnosis was on 10/16. Pt was hospitalized at [facility]. Pt was consuming 25-75% of meals during that time. Now main nutrition symptom is some nausea but no vomiting this admission.  Will order Ensure supplements and daily MVI.   Per weight records, pt has lost 6 lbs since 6/25 (3% wt loss x 4.5 months, insignificant for time frame).  I/Os: +1.1L since admit  Labs reviewed. Medications reviewed.  NUTRITION - FOCUSED PHYSICAL EXAM:  Working remotely.  Diet Order:   Diet Order            Diet Heart Room service appropriate? Yes; Fluid consistency: Thin  Diet effective now              EDUCATION NEEDS:   No education needs have been identified at this time  Skin:   Skin Assessment: Reviewed RN Assessment  Last BM:  PTA  Height:   Ht Readings from Last 1 Encounters:  04/03/19 5\' 4"  (1.626 m)    Weight:   Wt Readings from Last 1 Encounters:  04/05/19 73.6 kg    Ideal Body Weight:  54.5 kg  BMI:  Body mass index is 27.85 kg/m.  Estimated Nutritional Needs:   Kcal:  1800-2000  Protein:  80-90g  Fluid:  1.8L/day  Clayton Bibles, MS, RD, LDN Inpatient Clinical Dietitian Pager: [phone] After Hours Pager: [phone]

## 2019-04-05 NOTE — Plan of Care (Deleted)
Debra Morgan 

## 2019-04-05 NOTE — Progress Notes (Signed)
SLP Cancellation Note  Patient Details Name: Debra Morgan MRN: [id number] DOB: [birth year]   Cancelled treatment:       Reason Eval/Treat Not Completed: SLP screened, no needs identified, will sign off. Pt passed Yale swallow on the second attempt, has tolerated a diet well. No further skilled swallowing assessment needed. Will d/c order.    Aadit Hagood, Katherene Ponto 04/05/2019, 11:02 AM

## 2019-04-05 NOTE — Progress Notes (Signed)
Patient has not voided this shift, unsure of last void - nothing documented from prior shifts. Bladder scan showed approximately 115 ml. Secure chat message to Dr. Lonny Prude with this information. - Soyla Dryer RN

## 2019-04-05 NOTE — Evaluation (Signed)
Physical Therapy Evaluation Patient Details Name: Debra Morgan MRN: [id number] DOB: [birth year] Today's Date: 04/05/2019   History of Present Illness  73 y.o. female with history of hypertension, hyperlipidemia, hypothyroidism, dystonia was recently admitted on March 07, 2019 last month for COVID-19 infection subsequent to which patient has become increasingly encephalopathic with difficulty walking with bursts of agitation.EEG showed nonspecific cortical dysfunction in left temporal region MRI brain showed no acute abnormality  Clinical Impression  PTA pt living at single story home with ramped entrance with husband who is available 24 hours a day. Pt required assist with pivot transfers to transport chair which she propelled with her feet. Pt requires assist with transfers for bird baths and pt's husband provides iADLs. Pt is currently limited in safe mobility by increased anxiety with movement and decreased strength and sensation. Pt is mod I for bed mobility, min A for sit>stand and modA for stand step transfer to recliner. PT recommending home with resumption of HHPT and 24 hour assist. PT will continue to follow acutely.    Follow Up Recommendations Home health PT;Supervision/Assistance - 24 hour    Equipment Recommendations  None recommended by PT       Precautions / Restrictions Precautions Precautions: Fall;Other (comment)(apprehension with movement) Precaution Comments: h/o falls due to dystonia and R LE locking up Restrictions Weight Bearing Restrictions: No      Mobility  Bed Mobility Overal bed mobility: Modified Independent             General bed mobility comments: increased time and effort and use of bedrail to pull to EoB  Transfers Overall transfer level: Needs assistance Equipment used: 1 person hand held assist Transfers: Sit to/from Stand Sit to Stand: Min assist;From elevated surface         General transfer comment: minA for sit>stand, repeating "I  can't do it", however able to power up and requires assist for steady,   Ambulation/Gait Ambulation/Gait assistance: Mod assist Gait Distance (Feet): 2 Feet Assistive device: 1 person hand held assist Gait Pattern/deviations: Step-to pattern;Shuffle;Decreased dorsiflexion - right;Decreased stance time - right;Decreased step length - right Gait velocity: slowed Gait velocity interpretation: <1.31 ft/sec, indicative of household ambulator General Gait Details: modA for shifting weight to L LE so that she is able to move R LE from the hip, continues to say "I can't do it." however with holding pt's gaze and constant vc for sequencing able to ,         Balance Overall balance assessment: Needs assistance Sitting-balance support: Feet supported;No upper extremity supported Sitting balance-Leahy Scale: Fair     Standing balance support: Bilateral upper extremity supported Standing balance-Leahy Scale: Poor                               Pertinent Vitals/Pain Pain Assessment: 0-10 Pain Score: 10-Worst pain ever Pain Location: back and B LE  Pain Descriptors / Indicators: Aching;Constant Pain Intervention(s): Limited activity within patient's tolerance;Monitored during session;Repositioned    Home Living Family/patient expects to be discharged to:: Private residence Living Arrangements: Spouse/significant other Available Help at Discharge: Family;Available 24 hours/day Type of Home: House Home Access: Ramped entrance     Home Layout: One level Home Equipment: Walker - 4 wheels;Bedside commode;Transport chair(uses transport chair for mobility)      Prior Function Level of Independence: Needs assistance   Gait / Transfers Assistance Needed: husband assists with transfer to transfer chair, uses feet to propel  ADL's / Homemaking Assistance Needed: husband assist with transfer from transport chair to 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) LLE Deficits / Details: ROM WFL, strength grossly 3/5 with movement LLE Sensation: decreased light touch;history of 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)    Cervical / Trunk Assessment Cervical / Trunk Assessment: Other exceptions Cervical / Trunk Exceptions: per pt report h/o kyphoplasty after a fall 3 yrs ago  Communication   Communication: No difficulties  Cognition Arousal/Alertness: Awake/alert Behavior During Therapy: Anxious Overall Cognitive Status: No family/caregiver present to determine baseline cognitive functioning                                 General Comments: reports that she has dementia       General Comments General comments (skin integrity, edema, etc.): HR in 80s with activity    Exercises General Exercises - Lower Extremity Ankle Circles/Pumps: AROM;Both;10 reps;Seated Long Arc Quad: AROM;Both;10 reps;Seated Hip Flexion/Marching: AROM;Both;10 reps;Seated   Assessment/Plan    PT Assessment Patient needs continued PT services  PT Problem List Decreased strength;Decreased range of motion;Decreased activity tolerance;Decreased balance;Decreased mobility;Decreased knowledge of use of DME;Decreased knowledge of precautions;Cardiopulmonary status limiting activity       PT Treatment Interventions DME instruction;Gait training;Functional mobility training;Therapeutic activities;Therapeutic exercise;Balance training;Cognitive remediation;Patient/family  education;Wheelchair mobility training    PT Goals (Current goals can be found in the Care Plan section)  Acute Rehab PT Goals Patient Stated Goal: to get memory back  PT Goal Formulation: With patient Time For Goal Achievement: 04/19/19    Frequency Min 3X/week    AM-PAC PT "6 Clicks" Mobility  Outcome Measure Help needed turning from your back to your side while in a flat bed without using bedrails?: A Little Help needed moving from lying on your back to sitting on the side of a flat bed without using bedrails?: A Little Help needed moving to and from a bed to a chair (including a wheelchair)?: A Lot Help needed standing up from a chair using your arms (e.g., wheelchair or bedside chair)?: A Little Help needed to walk in hospital room?: Total Help needed climbing 3-5 steps with a railing? : Total 6 Click Score: 13    End of Session Equipment Utilized During Treatment: Gait belt Activity Tolerance: Patient tolerated treatment well Patient left: in chair;with call bell/phone within reach Nurse Communication: Mobility status(need for purewick placement) PT Visit Diagnosis: Muscle weakness (generalized) (M62.81);Difficulty in walking, not elsewhere classified (R26.2);History of falling (Z91.81)    Time: 2595-6387 PT Time Calculation (min) (ACUTE ONLY): 29 min   Charges:   PT Evaluation $PT Eval Moderate Complexity: 1 Mod PT Treatments $Therapeutic Activity: 8-22 mins        Debra Morgan B. Migdalia Dk PT, DPT Acute Rehabilitation Services Pager [phone] Office [phone]   [place] 04/05/2019, 3:14 PM

## 2019-04-05 NOTE — Progress Notes (Signed)
MRI brain completed, revealing no acute abnormality. Atrophy and chronic white matter changes most likely microvascular ischemia are stable from prior studies.  EEG showed nonspecific cortical dysfunction in left temporal region. No seizures or epileptiform discharges were seen throughout the recording.  A/R: 73 year old female with waxing/waning encephalopathy since being diagnosed with Covid in October.   1. MRI brain showed no acute abnormality.  2. EEG showed nonspecific cortical dysfunction in left temporal region. No seizures or epileptiform discharges were seen throughout the recording. 3. Overall clinical picture is most consistent with a mild Covid encephalopathy, which can be prolonged.  4. Neurology will sign off. Please call if there are additional questions.   Electronically signed: Dr. Kerney Elbe

## 2019-04-05 NOTE — Progress Notes (Addendum)
[facility]  PROGRESS NOTE  Trellis Guirguis ZOX:[id number] DOB: 1945-08-08 DOA: 04/03/2019 PCP: Laqueta Due., MD Admit date - 04/03/2019   Admitting Physician Laverna Peace, MD  Outpatient Primary MD for the patient is Laqueta Due., MD  LOS - 1 Brief Narrative   Debra Morgan is a 73 y.o. year old female with medical history significant for HTN, HLD, hypothyroidism, movement disorder/dystonia, wheelchair-bound, depression, tested positive for Covid on 10/16 (in Care Everywhere), hospitalized at [facility] 10/22-10/26 and treated with Decadron for COVID-19 pneumonia without hypoxia, admitted at home 11/7 for generalized weakness, decreased mobility and transfers in the setting of chronic dystonia, peripheral neuropathy complicated by metabolic encephalopathy.  At that time decline in her physical functional status thought to be a consequence of her recent COVID-19 viral infection: Negative work-up for acute CVA including head CT, CTA.  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  BuSpar was discontinued.    She now presents with worsening confusion, agitation, answering questions inappropriately and not being oriented on 04/03/2019.   Subjective  Ms.  Peel today is reports to me that she is concerned because she feels like she is more confused.  She reports "I am seeing things and no one else is seeing on the walls".  A & P   Waxing and waning confusion, suspect combination of Covid encephalopathy with hospital-acquired delirium.   Cannot rule out malingering as patient is stating I am more confused today.  No focal deficits, MRI brain negative for any acute pathology, EEG unremarkable. Noncontributory infectious w/u. Patient likely has some level of dementia given atrophy noted on MRI brain.  Neurology deems most consistent with mild Covid encephalopathy -Neurochecks, delirium precautions, avoid sedatives for pain control -Psych consulted given concern for  malingering in setting of depression/anxiety -Close monitoring of electrolytes will limit any concurrent infections to prevent any infectious encephalopathy  Persistent right lower extremity pain, seems to be chronic.   Has diminished trunk and right foot which is also chronic limited dystonia.  Lumbar x-ray shows chronic compression fracture without any evidence of acute pathology. -Continue scheduled IV Toradol for pain control -PT to evaluate--recommends 24 H assist/HHPT  Generalized abdominal pain, slightly improved.   AST ALT, T bili all unremarkable, mild nausea but tolerating oral diet..  Mild nausea without vomiting.  Tolerating oral intake.  Abdominal x-ray negative -Optimize bowel regimen, close monitor, antiemetics as needed.    Dyspnea with minimal exertion.   Patient is not hypoxic, chest x-ray shows improvement in residual opacities.  Do not suspect new COVID 19 infection.  Patient not very active, doubt PE, D-dimer unremarkable venous duplex on 11/1 - for DVT.  BMP unremarkable -Unsure the need for ambulatory O2 saturations given patient is wheelchair-bound  Recent COVID-19 infection (10/19) with pneumonia.   No viral symptoms.  On room air.  Chest imaging shows improvement in bilateral airspace opacities.  Doubt new infection.  Repeat was obtained in ED, D-dimer unremarkable doubt PE -Airborne/contact precautions -Questionable degree of symptoms, if deemed asymptomatic given patient tested positive over 21 days ago can eventually DC enteric/contact precautions per infectious control   Hypothyroidism.   TSH 4.7 on admission. -Increase home Synthroid from 75 to 100 mcg, encourage taking first thing in the morning without other medications -Will need close outpatient follow-up with PCP for repeat TSH  Bradycardia, stable Coreg was discontinued on previous hospitalization.  Worsen night -Monitor on telemetry  Chronic movement disorder/dystonia.  Chronically  wheelchair-bound. -Continue  Cogentin  HTN, stable Some mild relative hypotension with SBP's in the 100s on admission.  Currently normotensive. 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   Lexapro 20 mg (increased on previous hospital stay by psychiatry).  Reporting visual hallucinations, could be related to infectious encephalopathy related to Covid infection, given psych history also concern may need some adjustments in medications -Continue current regimen Lexapro, BuSpar -Psychiatry consulted given concurrent hallucinations and possible malingering given no active infectious/electrolyte abnormalities that could explain   Family Communication  :  Spoke with husband and daughter on combined call (at [phone]) on 11/17. Please update husband daily  Code Status :  Partial, discussed on day of admission  Disposition Plan  : IV Toradol for pain control,psych consult given visual hallucinations making her unsafe for discharge back home to family care  Consults  : Neurology  Procedures  : EEG, 11/16, MRI brain 11/16  DVT Prophylaxis  : SCDs  Lab Results  Component Value Date   PLT 285 04/05/2019    Diet :  Diet Order            Diet Heart Room service appropriate? Yes; Fluid consistency: Thin  Diet effective now               Inpatient Medications Scheduled Meds:  escitalopram  10 mg Oral Daily   feeding supplement (ENSURE ENLIVE)  237 mL Oral BID BM   ketorolac  15 mg Intravenous BID   levothyroxine  75 mcg Oral Q0600   multivitamin with minerals  1 tablet Oral Daily   Continuous Infusions:  PRN Meds:.acetaminophen **OR** acetaminophen, labetalol, morphine injection, ondansetron **OR** ondansetron (ZOFRAN) IV,  traMADol  Antibiotics  :   Anti-infectives (From admission, onward)   None       Objective   Vitals:   04/04/19 1900 04/04/19 2115 04/05/19 0139 04/05/19 0728  BP: (!) 112/95  107/73 110/83  Pulse: 83 73 61 (!) 57  Resp: (!) 24 18 15    Temp:   98.2 F (36.8 C) 99.2 F (37.3 C)  TempSrc:   Oral Oral  SpO2: 97% 96% 96% 97%  Weight:   73.6 kg   Height:        SpO2: 97 %  Wt Readings from Last 3 Encounters:  04/05/19 73.6 kg  04/01/19 75.6 kg  03/25/19 75.6 kg     Intake/Output Summary (Last 24 hours) at 04/05/2019 1502 Last data filed at 04/04/2019 1931 Gross per 24 hour  Intake 191.25 ml  Output --  Net 191.25 ml    Physical Exam:  Elderly female, lying in bed comfortably Reports that she is "seeing dead people and other things that other people are not seeing more than in the room " Oriented to self, orientation questions states "I do not know"; "I was told I am in the hospital".  "I think I have made a turn for the worse because I am actually more confused this morning" Right lower extremity able to move against gravity, and left lower extremity able to move against some resistance, otherwise no focal deficits No obvious JVD On room air, clear breath sounds bilaterally Abdomen soft, diminished bowel sounds, slight tenderness with deep palpation, without rebound tenderness or guarding or rigidity Regular rate and rhythm, with no appreciable murmurs rubs or gallops, no  peripheral edema     I have personally reviewed the following:   Data Reviewed:  CBC Recent Labs  Lab 03/31/19 1042 04/01/19 2100 04/03/19 1800 04/04/19 0120 04/05/19 0409  WBC 5.7 5.3 5.5 4.7 5.8  HGB 11.9* 12.4 13.0 12.2 11.7*  HCT 37.1 39.2 39.8 37.9 35.6*  PLT 281 274 293 285 285  MCV 98.1 100.8* 98.0 99.0 97.5  MCH 31.5 31.9 32.0 31.9 32.1  MCHC 32.1 31.6 32.7 32.2 32.9  RDW 13.2 13.4 13.2 13.2 13.1  LYMPHSABS 1.5 1.4 1.5  --   --   MONOABS 0.5 0.6 0.6  --   --   EOSABS  0.1 0.3 0.2  --   --   BASOSABS 0.0 0.0 0.0  --   --     Chemistries  Recent Labs  Lab 03/31/19 1042 04/01/19 2100 04/03/19 1800 04/04/19 0120 04/05/19 0409  NA 137 138 136 137 138  K 3.4* 3.8 3.7 3.6 4.1  CL 103 101 99 102 101  CO2 24 28 24 23 23   GLUCOSE 113* 112* 109* 111* 95  BUN 22 25* 16 14 23   CREATININE 0.90 0.98 0.86 0.71 0.98  CALCIUM 8.9 9.4 9.5 9.3 9.2  AST 20 19 17   --  15  ALT 42 34 25  --  19  ALKPHOS 77 92 82  --  71  BILITOT 0.7 0.4 0.7  --  0.7   ------------------------------------------------------------------------------------------------------------------ No results for input(s): CHOL, HDL, LDLCALC, TRIG, CHOLHDL, LDLDIRECT in the last 72 hours.  No results found for: HGBA1C ------------------------------------------------------------------------------------------------------------------ Recent Labs    04/03/19 1800  TSH 4.761*   ------------------------------------------------------------------------------------------------------------------ No results for input(s): VITAMINB12, FOLATE, FERRITIN, TIBC, IRON, RETICCTPCT in the last 72 hours.  Coagulation profile No results for input(s): INR, PROTIME in the last 168 hours.  Recent Labs    04/04/19 0120  DDIMER 0.40    Cardiac Enzymes No results for input(s): CKMB, TROPONINI, MYOGLOBIN in the last 168 hours.  Invalid input(s): CK ------------------------------------------------------------------------------------------------------------------    Component Value Date/Time   BNP 33.9 04/04/2019 0240    Micro Results Recent Results (from the past 240 hour(s))  SARS CORONAVIRUS 2 (TAT 6-24 HRS) Nasopharyngeal Nasopharyngeal Swab     Status: Abnormal   Collection Time: 04/01/19  8:33 AM   Specimen: Nasopharyngeal Swab  Result Value Ref Range Status   SARS Coronavirus 2 POSITIVE (A) NEGATIVE Final    Comment: RESULT CALLED TO, READ BACK BY AND VERIFIED WITH: Precious Reel, ED RN BY EMAIL AT  4010 ON 04/01/19 BY C. JESSUP, MT. (NOTE) SARS-CoV-2 target nucleic acids are DETECTED. The SARS-CoV-2 RNA is generally detectable in upper and lower respiratory specimens during the acute phase of infection. Positive results are indicative of active infection with SARS-CoV-2. Clinical  correlation with patient history and other diagnostic information is necessary to determine patient infection status. Positive results do  not rule out bacterial infection or co-infection with other viruses. The expected result is Negative. Fact Sheet for Patients: SugarRoll.be Fact Sheet for Healthcare Providers: https://www.woods-mathews.com/ This test is not yet approved or cleared by the [place] FDA and  has been authorized for detection and/or diagnosis of SARS-CoV-2 by FDA under an Emergency Use Authorization (EUA). This EUA will remain  in effect (meaning  this test can be used) for the duration of the COVID-19 declaration under Section 564(b)(1) of the Act, 21 U.S.C. section 360bbb-3(b)(1), unless the authorization is terminated or revoked sooner. Performed at [facility], [place]  [address], [place], [place] [address]   Urine culture     Status: None   Collection Time: 04/01/19  8:23 PM   Specimen: Urine, Clean Catch  Result Value Ref Range Status   Specimen Description   Final    URINE, CLEAN CATCH Performed at [facility], [address], [place], [place] [address]    Special Requests   Final    NONE Performed at [facility], [address] [place], [place] [address]    Culture   Final    Multiple bacterial morphotypes present, none predominant. Suggest appropriate recollection if clinically indicated.   Report Status 04/03/2019 FINAL  Final  SARS CORONAVIRUS 2 (TAT 6-24 HRS) Nasopharyngeal Nasopharyngeal Swab     Status: Abnormal   Collection Time: 04/03/19  8:43 PM   Specimen: Nasopharyngeal  Swab  Result Value Ref Range Status   SARS Coronavirus 2 POSITIVE (A) NEGATIVE Final    Comment: RESULT CALLED TO, READ BACK BY AND VERIFIED WITH: RN Guerry Bruin AT 9147 04/04/2019 BY L BENFIELD (NOTE) SARS-CoV-2 target nucleic acids are DETECTED. The SARS-CoV-2 RNA is generally detectable in upper and lower respiratory specimens during the acute phase of infection. Positive results are indicative of active infection with SARS-CoV-2. Clinical  correlation with patient history and other diagnostic information is necessary to determine patient infection status. Positive results do  not rule out bacterial infection or co-infection with other viruses. The expected result is Negative. Fact Sheet for Patients: HairSlick.no Fact Sheet for Healthcare Providers: quierodirigir.com This test is not yet approved or cleared by the [place] FDA and  has been authorized for detection and/or diagnosis of SARS-CoV-2 by FDA under an Emergency Use Authorization (EUA). This EUA will remain  in effect (meaning this test can be [place] ed) for the duration of the COVID-19 declaration under Section 564(b)(1) of the Act, 21 U.S.C. section 360bbb-3(b)(1), unless the authorization is terminated or revoked sooner. Performed at [facility], [address], [place], [place] [address]     Radiology Reports Ct Code Stroke Cta Head W/wo Contrast  Result Date: 03/25/2019 CLINICAL DATA:  Altered mental status.  Hemorrhage suspected EXAM: CT ANGIOGRAPHY HEAD AND NECK TECHNIQUE: Multidetector CT imaging of the head and neck was performed using the standard protocol during bolus administration of intravenous contrast. Multiplanar CT image reconstructions and MIPs were obtained to evaluate the vascular anatomy. Carotid stenosis measurements (when applicable) are obtained utilizing NASCET criteria, using the distal internal carotid diameter as the denominator.  CONTRAST:  80mL OMNIPAQUE IOHEXOL 350 MG/ML SOLN COMPARISON:  Head CT from earlier today FINDINGS: CTA NECK FINDINGS Aortic arch: Atherosclerotic plaque.  Three vessel branching. Right carotid system: Tortuous ICA. Vessels are smooth and widely patent. No atheromatous changes. Left carotid system: Atherosclerotic calcification about the bifurcation that is mild for age. No stenosis or ulceration. Vertebral arteries: No brachiocephalic or proximal subclavian flow limiting stenosis. Strong dominance of the right vertebral artery which is smooth and widely patent to the dura. A diminutive left vertebral artery is also patent to the dura although most flow is into the left occipital soft tissues. Skeleton: No acute or aggressive finding Other neck: Negative Upper chest: Patchy airspace disease in the upper lungs correlating with history of COVID-19 positivity Review of the MIP images confirms the above findings CTA HEAD FINDINGS Anterior circulation: Vessels are smooth and widely patent. No branch occlusion, beading, or aneurysm. Posterior circulation: Strong right vertebral artery dominance. A tiny left vertebral  artery does reach the basilar. Aplastic right and hypoplastic left P1 segment. No branch occlusion, beading, or aneurysm Venous sinuses: Patent Anatomic variants: As above Review of the MIP images confirms the above findings IMPRESSION: 1. No emergent vascular finding. 2. Biapical airspace disease correlating with history of COVID-19 positivity. 3. Mild for age atherosclerosis. Electronically Signed   By: Marnee SpringJonathon  Watts M.D.   On: 03/25/2019 05:15   Dg Lumbar Spine 2-3 Views  Result Date: 04/04/2019 CLINICAL DATA:  Fall EXAM: LUMBAR SPINE - 2-3 VIEW COMPARISON:  02/04/2019 FINDINGS: Chronic severe compression deformity at L1 with status post vertebroplasty, stable. No acute fracture. Degenerative disc and facet disease throughout the lumbar spine. No subluxation. SI joints symmetric and unremarkable.  IMPRESSION: No acute bony abnormality. Electronically Signed   By: Charlett NoseKevin  Dover M.D.   On: 04/04/2019 19:28   Ct Head Wo Contrast  Result Date: 03/31/2019 CLINICAL DATA:  Altered mental status EXAM: CT HEAD WITHOUT CONTRAST TECHNIQUE: Contiguous axial images were obtained from the base of the skull through the vertex without intravenous contrast. COMPARISON:  03/25/2019 FINDINGS: Brain: No evidence of acute infarction, hemorrhage, hydrocephalus, extra-axial collection or mass lesion/mass effect. Periventricular and deep white matter hypodensity. Vascular: No hyperdense vessel or unexpected calcification. Skull: Normal. Negative for fracture or focal lesion. Sinuses/Orbits: No acute finding. Other: None. IMPRESSION: No acute intracranial pathology.  Small-vessel white matter disease. Electronically Signed   By: Lauralyn PrimesAlex  Bibbey M.D.   On: 03/31/2019 13:52   Ct Code Stroke Cta Neck W/wo Contrast  Result Date: 03/25/2019 CLINICAL DATA:  Altered mental status.  Hemorrhage suspected EXAM: CT ANGIOGRAPHY HEAD AND NECK TECHNIQUE: Multidetector CT imaging of the head and neck was performed using the standard protocol during bolus administration of intravenous contrast. Multiplanar CT image reconstructions and MIPs were obtained to evaluate the vascular anatomy. Carotid stenosis measurements (when applicable) are obtained utilizing NASCET criteria, using the distal internal carotid diameter as the denominator. CONTRAST:  80mL OMNIPAQUE IOHEXOL 350 MG/ML SOLN COMPARISON:  Head CT from earlier today FINDINGS: CTA NECK FINDINGS Aortic arch: Atherosclerotic plaque.  Three vessel branching. Right carotid system: Tortuous ICA. Vessels are smooth and widely patent. No atheromatous changes. Left carotid system: Atherosclerotic calcification about the bifurcation that is mild for age. No stenosis or ulceration. Vertebral arteries: No brachiocephalic or proximal subclavian flow limiting stenosis. Strong dominance of the right  vertebral artery which is smooth and widely patent to the dura. A diminutive left vertebral artery is also patent to the dura although most flow is into the left occipital soft tissues. Skeleton: No acute or aggressive finding Other neck: Negative Upper chest: Patchy airspace disease in the upper lungs correlating with history of COVID-19 positivity Review of the MIP images confirms the above findings CTA HEAD FINDINGS Anterior circulation: Vessels are smooth and widely patent. No branch occlusion, beading, or aneurysm. Posterior circulation: Strong right vertebral artery dominance. A tiny left vertebral artery does reach the basilar. Aplastic right and hypoplastic left P1 segment. No branch occlusion, beading, or aneurysm Venous sinuses: Patent Anatomic variants: As above Review of the MIP images confirms the above findings IMPRESSION: 1. No emergent vascular finding. 2. Biapical airspace disease correlating with history of COVID-19 positivity. 3. Mild for age atherosclerosis. Electronically Signed   By: Marnee SpringJonathon  Watts M.D.   On: 03/25/2019 05:15   Ct Angio Chest Pe W And/or Wo Contrast  Result Date: 03/16/2019 CLINICAL DATA:  Shortness of breath, COVID-19 positive. EXAM: CT ANGIOGRAPHY CHEST WITH CONTRAST TECHNIQUE: Multidetector CT  imaging of the chest was performed using the standard protocol during bolus administration of intravenous contrast. Multiplanar CT image reconstructions and MIPs were obtained to evaluate the vascular anatomy. CONTRAST:  75mL OMNIPAQUE IOHEXOL 350 MG/ML SOLN COMPARISON:  CTA November 04, 2018 FINDINGS: Cardiovascular: Satisfactory opacification of the pulmonary arteries to the lobar level. More distal evaluation limited by respiratory motion and underlying parenchymal disease. No pulmonary artery filling defects are seen to the lobar level. Pulmonary arterial caliber is upper limits normal, unchanged from comparison. Atherosclerotic plaque of the nonaneurysmal thoracic aorta. Normal  3 vessel branching of the arch with scant calcification in the proximal great vessels. Mild cardiomegaly with biatrial enlargement. No pericardial effusion. No elevation of the RV/LV ratio (0.75). Mediastinum/Nodes: No enlarged mediastinal, hilar or axillary lymph nodes. Thyroid gland, trachea, and esophagus demonstrate no significant findings. Lungs/Pleura: Multifocal areas peripheral predominant ground-glass are present in the lungs. No pneumothorax or effusion. Mild septal thickening noted in the apices and bases. Diffuse airways thickening and scattered secretions. Upper Abdomen: Non rotation of both kidneys, anatomic variant. Partial fatty replacement of the pancreas. Slight reflux of contrast into the hepatic veins. Musculoskeletal: Multilevel degenerative changes are present in the imaged portions of the spine. No acute osseous abnormality or suspicious osseous lesion. No concerning chest wall lesions S-shaped curvature of the thoracolumbar spine vertebroplasty changes at L1, partially visualized. Review of the MIP images confirms the above findings. IMPRESSION: 1. No evidence of pulmonary embolism to the lobar level. More distal evaluation limited by respiratory motion and underlying parenchymal disease. 2. Multifocal areas of peripheral predominant ground-glass in the lungs, compatible with atypical infection in the setting of known COVID-19 positivity. 3. Some septal thickening in the apices may reflect superimposed edema. 4. Aortic Atherosclerosis (ICD10-I70.0). Electronically Signed   By: Kreg Shropshire M.D.   On: 03/16/2019 21:15   Mr Brain Wo Contrast  Result Date: 04/04/2019 CLINICAL DATA:  Altered level of consciousness. EXAM: MRI HEAD WITHOUT CONTRAST TECHNIQUE: Multiplanar, multiecho pulse sequences of the brain and surrounding structures were obtained without intravenous contrast. COMPARISON:  CT head 03/31/2019.  MRI 12/01/2018 FINDINGS: Brain: Negative for acute infarct. Scattered subcortical  and deep white matter hyperintensities similar to the prior MRI. Generalized atrophy without hydrocephalus. Negative for hemorrhage or mass. No midline shift. Vascular: Normal arterial flow voids Skull and upper cervical spine: Negative Sinuses/Orbits: Mucosal edema paranasal sinuses. Bilateral cataract surgery Other: None IMPRESSION: No acute abnormality Atrophy and chronic white matter changes most likely microvascular ischemia are stable from prior studies. Electronically Signed   By: Marlan Palau M.D.   On: 04/04/2019 10:13   Ct Abdomen Pelvis W Contrast  Result Date: 03/31/2019 CLINICAL DATA:  Generalized abdominal pain.  Fever and confusion. EXAM: CT ABDOMEN AND PELVIS WITH CONTRAST TECHNIQUE: Multidetector CT imaging of the abdomen and pelvis was performed using the standard protocol following bolus administration of intravenous contrast. CONTRAST:  OMNIPAQUE IOHEXOL 300 MG/ML  SOLN COMPARISON:  Chest CT 03/16/2019 FINDINGS: Lower chest: Patchy E bibasilar infiltrates and scattered small pulmonary nodules. No pleural effusions. The heart is borderline enlarged. No pericardial effusion. Coronary artery and aortic calcifications are noted. Hepatobiliary: No focal hepatic lesions or intrahepatic biliary dilatation. The gallbladder appears normal. No common bile duct dilatation. Pancreas: No mass, inflammation or ductal dilatation. Spleen: Normal size.  No focal lesions. Adrenals/Urinary Tract: The adrenal glands are normal. No renal, ureteral or bladder calculi or mass. Mild distention of the bladder. Stomach/Bowel: The stomach, duodenum, small bowel and colon are  unremarkable. No acute inflammatory changes, mass lesions or obstructive findings. The terminal ileum and appendix are normal. Vascular/Lymphatic: Moderate atherosclerotic calcifications involving the aorta and branch vessels but no aneurysm. The major venous structures are patent. No mesenteric or retroperitoneal mass or adenopathy. Small  scattered lymph nodes are noted. Reproductive: Uterine fibroids are noted. The endometrium is borderline measuring 7.5 mm. Follow-up pelvic ultrasound examination is suggested. Both ovaries appear normal. Other: No free pelvic fluid collections. No inguinal mass or adenopathy. Musculoskeletal: No significant bony findings. Vertebral augmentation changes are noted at L1 with moderate retropulsion. Osteoporosis is noted. IMPRESSION: 1. No acute abdominal/pelvic findings, mass lesions or adenopathy. 2. Patchy bilateral lower lobe infiltrates and a few small pulmonary nodules. This appears slightly progressive when compared to the prior CT scan where most of the infiltrates or upper lobe predominant. The small nodules are likely inflammatory. 3. Slightly prominent endometrium for age. Recommend follow-up pelvic ultrasound examination. Uterine fibroids are noted. The ovaries are normal. 4. Stable vertebral augmentation changes at L1. Electronically Signed   By: Rudie Meyer M.D.   On: 03/31/2019 14:04   Dg Chest Portable 1 View  Result Date: 04/01/2019 CLINICAL DATA:  COVID positive. Chest pain, shortness of breath, cough EXAM: PORTABLE CHEST 1 VIEW COMPARISON:  03/31/2019 FINDINGS: Patchy airspace disease in the right upper lobe and left lower lobe have improved. Mild residual opacities remain in both areas, most pronounced at the left base. No effusions. Heart is borderline in size. No acute bony abnormality. IMPRESSION: Improving bilateral airspace opacities. Electronically Signed   By: Charlett Nose M.D.   On: 04/01/2019 20:17   Dg Chest Port 1 View  Result Date: 03/31/2019 CLINICAL DATA:  Altered level of consciousness and weakness. Coronavirus infection. EXAM: PORTABLE CHEST 1 VIEW COMPARISON:  03/29/2019 FINDINGS: Heart size is normal. Chronic aortic atherosclerosis. Patchy bilateral perihilar pulmonary density could represent sequela of viral pneumonia. No evidence of dense consolidation or lobar  collapse. No visible effusion. No change since the study of 2 days ago. IMPRESSION: Patchy perihilar density could represent viral pneumonia. No significant change since 2 days ago. Electronically Signed   By: Paulina Fusi M.D.   On: 03/31/2019 14:43   Dg Chest Port 1 View  Result Date: 03/25/2019 CLINICAL DATA:  Altered mental status EXAM: PORTABLE CHEST 1 VIEW COMPARISON:  01/14/2019 FINDINGS: Lower volumes and increased patchy bilateral pulmonary opacity. Cardiomegaly and vascular pedicle widening accentuated by rotation and lower volumes. No visible effusion or pneumothorax IMPRESSION: Patchy bilateral infiltrate correlating with history of COVID-19 positivity. Opacity and inflation has worsened since 01/14/2019. Electronically Signed   By: Marnee Spring M.D.   On: 03/25/2019 05:36   Dg Chest Portable 1 View  Result Date: 03/16/2019 CLINICAL DATA:  Initial evaluation for acute cough, weakness, OB positive. EXAM: PORTABLE CHEST 1 VIEW COMPARISON:  Prior radiograph from 11/11/2018. FINDINGS: Cardiac and mediastinal silhouettes within normal limits. Lungs mildly hypoinflated. Linear atelectatic changes noted within the right perihilar region, lingula, and left lung base. No other focal airspace disease. No edema or effusion. No pneumothorax. No acute osseous finding. IMPRESSION: 1. Mild scattered subsegmental atelectasis within the right perihilar region and left lung base. 2. No other radiographic evidence for active cardiopulmonary disease. Electronically Signed   By: Rise Mu M.D.   On: 03/16/2019 19:15   Dg Abd Portable 1v  Result Date: 04/04/2019 CLINICAL DATA:  Abdominal pain EXAM: PORTABLE ABDOMEN - 1 VIEW COMPARISON:  None. FINDINGS: The bowel gas pattern is normal. No  radio-opaque calculi or other significant radiographic abnormality are seen. IMPRESSION: Negative. Electronically Signed   By: Signa Kell M.D.   On: 04/04/2019 19:28   Ct Head Code Stroke Wo  Contrast  Result Date: 03/25/2019 CLINICAL DATA:  Code stroke. Altered mental status with unclear cause EXAM: CT HEAD WITHOUT CONTRAST TECHNIQUE: Contiguous axial images were obtained from the base of the skull through the vertex without intravenous contrast. COMPARISON:  03/10/2019 FINDINGS: Brain: No evidence of acute infarction, hemorrhage, hydrocephalus, extra-axial collection or mass lesion/mass effect. Mild patchy low-density in the cerebral white matter attributed to chronic small vessel ischemia. Central predominant cerebral volume loss. Vascular: No hyperdense vessel or unexpected calcification. Skull: Normal. Negative for fracture or focal lesion. Sinuses/Orbits: Negative Other: Page was placed 03/25/2019 at 4:45 am to provider [facility] DAVID . ASPECTS [facility] Stroke Program Early CT Score) Not scored without localizing symptoms IMPRESSION: 1. No acute finding. 2. Mild chronic small vessel disease. Electronically Signed   By: Marnee Spring M.D.   On: 03/25/2019 04:47   Vas [place] Lower Extremity Venous (dvt)  Result Date: 03/20/2019  Lower Venous Study Indications: Elevated Ddimer.  Risk Factors: COVID 19 positive. Limitations: Poor ultrasound/tissue interface. Comparison Study: No prior studies. Performing Technologist: Chanda Busing RVT  Examination Guidelines: A complete evaluation includes B-mode imaging, spectral Doppler, color Doppler, and power Doppler as needed of all accessible portions of each vessel. Bilateral testing is considered an integral part of a complete examination. Limited examinations for reoccurring indications may be performed as noted.  +---------+---------------+---------+-----------+----------+--------------+  RIGHT     Compressibility Phasicity Spontaneity Properties Thrombus Aging  +---------+---------------+---------+-----------+----------+--------------+  CFV       Full            Yes       Yes                                     +---------+---------------+---------+-----------+----------+--------------+  SFJ       Full                                                             +---------+---------------+---------+-----------+----------+--------------+  FV Prox   Full                                                             +---------+---------------+---------+-----------+----------+--------------+  FV Mid    Full                                                             +---------+---------------+---------+-----------+----------+--------------+  FV Distal Full                                                             +---------+---------------+---------+-----------+----------+--------------+  PFV       Full                                                             +---------+---------------+---------+-----------+----------+--------------+  POP       Full            Yes       Yes                                    +---------+---------------+---------+-----------+----------+--------------+  PTV       Full                                                             +---------+---------------+---------+-----------+----------+--------------+  PERO      Full                                                             +---------+---------------+---------+-----------+----------+--------------+   +---------+---------------+---------+-----------+----------+--------------+  LEFT      Compressibility Phasicity Spontaneity Properties Thrombus Aging  +---------+---------------+---------+-----------+----------+--------------+  CFV       Full            Yes       Yes                                    +---------+---------------+---------+-----------+----------+--------------+  SFJ       Full                                                             +---------+---------------+---------+-----------+----------+--------------+  FV Prox   Full                                                              +---------+---------------+---------+-----------+----------+--------------+  FV Mid    Full                                                             +---------+---------------+---------+-----------+----------+--------------+  FV Distal Full                                                             +---------+---------------+---------+-----------+----------+--------------+  PFV       Full                                                             +---------+---------------+---------+-----------+----------+--------------+  POP       Full            Yes       Yes                                    +---------+---------------+---------+-----------+----------+--------------+  PTV       Full                                                             +---------+---------------+---------+-----------+----------+--------------+  PERO      Full                                                             +---------+---------------+---------+-----------+----------+--------------+     Summary: Right: There is no evidence of deep vein thrombosis in the lower extremity. No cystic structure found in the popliteal fossa. Left: There is no evidence of deep vein thrombosis in the lower extremity. No cystic structure found in the popliteal fossa.  *See table(s) above for measurements and observations. Electronically signed by Waverly Ferrari MD on 03/20/2019 at 11:30:52 AM.    Final      Time Spent in minutes  30     Laverna Peace M.D on 04/05/2019 at 3:02 PM  To page go to [email or web address] - password [facility]

## 2019-04-06 LAB — CBC
HCT: 35.9 % — ABNORMAL LOW (ref 36.0–46.0)
Hemoglobin: 11.7 g/dL — ABNORMAL LOW (ref 12.0–15.0)
MCH: 31.5 pg (ref 26.0–34.0)
MCHC: 32.6 g/dL (ref 30.0–36.0)
MCV: 96.8 fL (ref 80.0–100.0)
Platelets: 275 10*3/uL (ref 150–400)
RBC: 3.71 MIL/uL — ABNORMAL LOW (ref 3.87–5.11)
RDW: 13.2 % (ref 11.5–15.5)
WBC: 4.2 10*3/uL (ref 4.0–10.5)
nRBC: 0 % (ref 0.0–0.2)

## 2019-04-06 LAB — COMPREHENSIVE METABOLIC PANEL
ALT: 18 U/L (ref 0–44)
AST: 18 U/L (ref 15–41)
Albumin: 2.9 g/dL — ABNORMAL LOW (ref 3.5–5.0)
Alkaline Phosphatase: 69 U/L (ref 38–126)
Anion gap: 11 (ref 5–15)
BUN: 38 mg/dL — ABNORMAL HIGH (ref 8–23)
CO2: 24 mmol/L (ref 22–32)
Calcium: 9.5 mg/dL (ref 8.9–10.3)
Chloride: 103 mmol/L (ref 98–111)
Creatinine, Ser: 0.9 mg/dL (ref 0.44–1.00)
GFR calc Af Amer: >60 mL/min (ref >60)
GFR calc non Af Amer: >60 mL/min (ref >60)
Glucose, Bld: 106 mg/dL — ABNORMAL HIGH (ref 70–99)
Potassium: 4.1 mmol/L (ref 3.5–5.1)
Sodium: 138 mmol/L (ref 135–145)
Total Bilirubin: 1 mg/dL (ref 0.3–1.2)
Total Protein: 6.1 g/dL — ABNORMAL LOW (ref 6.5–8.1)

## 2019-04-06 LAB — GLUCOSE, CAPILLARY
Glucose-Capillary: 100 mg/dL — ABNORMAL HIGH (ref 70–99)
Glucose-Capillary: 119 mg/dL — ABNORMAL HIGH (ref 70–99)
Glucose-Capillary: 133 mg/dL — ABNORMAL HIGH (ref 70–99)
Glucose-Capillary: 101 mg/dL — ABNORMAL HIGH (ref 70–99)

## 2019-04-06 NOTE — Progress Notes (Addendum)
PROGRESS NOTE    Debra Morgan  PNP:[id number] DOB: [birth year] DOA: 04/03/2019 PCP: Karleen Hampshire., MD   Brief Narrative:   Debra Morgan is Debra Morgan 73 y.o. year old female with medical history significant for HTN, HLD, hypothyroidism, movement disorder/dystonia, wheelchair-bound, depression, tested positive for Covid on 10/16 (in [place]), hospitalized at [facility] 10/22-10/26 and treated with Decadron for COVID-19 pneumonia without hypoxia, admitted at home 11/7 for generalized weakness, decreased mobility and transfers in the setting of chronic dystonia, peripheral neuropathy complicated by metabolic encephalopathy.  At that time decline in her physical functional status thought to be [name] consequence of her recent COVID-19 viral infection: Negative work-up for acute CVA including head CT, CTA.  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  BuSpar was discontinued.    She now presents with worsening confusion, agitation, answering questions inappropriately and not being oriented on 04/03/2019.  Assessment & Plan:   Principal Problem:   Acute encephalopathy Active Problems:   Dystonia   Hypertension   COVID-19 virus infection   Hypothyroidism   Confusion   Encephalopathy acute   Generalized pain   Right leg weakness   Abdominal pain   Bradycardia   Visual hallucinations  Waxing and waning confusion, suspect combination of Covid encephalopathy with hospital-acquired delirium.   - Workup at this point without notable abnormalities - MRI without acute abnormality - EEG with nonspecific cortical dysfunction in L temporal region.  No seizures or epileptiform discharges were seen throughout the recording.  - her ESR was elevated (90), CRP negative    - urine cx with multiple bacterial morphotypes, none predominant - she's been afebrile - some concern for malingering by previous providers as well - Neurology has seen patient and is concerned for COVID encephalopathy  (which can be prolonged) - now signed off - will have pt continue with PT/OT and speech therapy. - delirium precautions - psych has been consulted - no need for inpatient psych and recommend outpatient psych - they note malingering possible (see note) (though based on conversation with family, doesn't sound c/w this).  I've asked psychiatry to see her again today with concern for some degree of somatization. - Family requested transfer for second opinion.  I called [facility] and [facility] who both declined transfer.  Persistent right lower extremity pain, seems to be chronic.   Has diminished trunk and right foot which is also chronic limited dystonia.  Lumbar x-ray shows chronic compression fracture without any evidence of acute pathology. -Continue scheduled IV Toradol for pain control -PT to evaluate--recommends 24 H assist/HHPT  Generalized abdominal pain, slightly improved.   AST ALT, T bili all unremarkable, mild nausea but tolerating oral diet..  Mild nausea without vomiting.  Tolerating oral intake.  Abdominal x-ray negative -Optimize bowel regimen, close monitor, antiemetics as needed.    Dyspnea with minimal exertion.   Patient is not hypoxic, chest x-ray shows improvement in residual opacities.  Do not suspect new COVID 19 infection.  Patient not very active, doubt PE, D-dimer unremarkable venous duplex on 11/1 - for DVT.  BMP unremarkable -Unsure the need for ambulatory O2 saturations given patient is wheelchair-bound  Recent COVID-19 infection (10/19) with pneumonia.   No viral symptoms.  On room air.  Chest imaging shows improvement in bilateral airspace opacities.  Doubt new infection.  Repeat was obtained in ED, D-dimer unremarkable doubt PE -Airborne/contact precautions -Questionable degree of symptoms, if deemed asymptomatic given patient tested positive over 21 days ago can eventually  DC enteric/contact precautions per infectious control - will attempt to discuss this with infectious  disease.  Hypothyroidism.   TSH 4.7 on admission. -Increase home Synthroid from 75 to 100 mcg, encourage taking first thing in the morning without other medications -Will need close outpatient follow-up with PCP for repeat TSH  Bradycardia, stable Coreg was discontinued on previous hospitalization.  Worsen night -Monitor on telemetry  Chronic movement disorder/dystonia.  Chronically wheelchair-bound.  Per neuro note from [facility], 4-5 yr hx of progressive freezing of gait most prominent in R leg - following with neurology outpatient. -Continue Cogentin  HTN, stable Some mild relative hypotension with SBP's in the 100s on admission.  Currently normotensive. 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   Lexapro 10 mg daily ordered.  Reporting visual hallucinations, could be related to infectious encephalopathy related to Covid infection, given psych history also concern may need some adjustments in medications -Continue current regimen Lexapro, BuSpar -Psychiatry consulted given concurrent hallucinations and possible malingering given no active infectious/electrolyte abnormalities that could explain  DVT prophylaxis: SCD Code Status:full  Family Communication: daughter Debra Morgan and husband 11/19 Disposition Plan: pending further improvement   Consultants:   neurology  Procedures:  EEG IMPRESSION: This study is suggestive of nonspecific cortical dysfunction in left temporal region. No seizures or epileptiform discharges were seen throughout the recording.   Antimicrobials:  Anti-infectives (From admission, onward)   None     Subjective: Confused Says her name is Debra Morgan it is October, but can't say the year  Objective:  Vitals:   04/05/19 1546 04/06/19 0019 04/06/19 0733 04/06/19 1400  BP: 109/90 108/64 (!) 106/56 107/60  Pulse: 73 64 64 74  Resp:  16 18 16   Temp: 99.8 F (37.7 C) 98.4 F (36.9 C) 98.9 F (37.2 C) 99.4 F (37.4 C)  TempSrc: Oral Oral Oral Axillary  SpO2: 96% 98% 96% 95%  Weight:      Height:        Intake/Output Summary (Last 24 hours) at 04/06/2019 1608 Last data filed at 04/06/2019 1458 Gross per 24 hour  Intake 360 ml  Output -  Net 360 ml   Filed Weights   04/03/19 1655 04/05/19 0139  Weight: 75.6 kg 73.6 kg    Examination:  General: No acute distress. Cardiovascular: RRR Lungs: unlabored Abdomen: Soft, nontender, nondistended  Neurological: Alert and oriented 3. Moves all extremities 4. Cranial nerves II through XII grossly intact. Skin: Warm and dry. No rashes or lesions. Extremities: No clubbing or cyanosis. No edema.   Data Reviewed: I have personally reviewed following labs and imaging studies  CBC: Recent Labs  Lab 03/31/19 1042 04/01/19 2100 04/03/19 1800 04/04/19 0120 04/05/19 0409 04/06/19 0408  WBC 5.7 5.3 5.5 4.7 5.8 4.2  NEUTROABS 3.6 3.0 3.2  --   --   --   HGB 11.9* 12.4 13.0 12.2 11.7* 11.7*  HCT 37.1 39.2 39.8 37.9 35.6* 35.9*  MCV 98.1 100.8* 98.0 99.0 97.5 96.8  PLT 281 274 293 285 285 637   Basic Metabolic Panel: Recent Labs  Lab 04/01/19 2100 04/03/19 1800 04/04/19 0120 04/05/19 0409 04/06/19 0408  NA 138 136 137 138 138  K 3.8 3.7 3.6 4.1 4.1  CL 101 99 102 101 103  CO2 28 24 23 23 24   GLUCOSE 112* 109*  111* 95 106*  BUN 25* 16 14 23  38*  CREATININE 0.98 0.86 0.71 0.98 0.90  CALCIUM 9.4 9.5 9.3 9.2 9.5   GFR: Estimated Creatinine Clearance: 53.9 mL/min (by C-G formula based on SCr of 0.9 mg/dL). Liver Function Tests: Recent Labs  Lab 03/31/19 1042 04/01/19 2100 04/03/19 1800 04/05/19 0409 04/06/19 0408  AST 20 19 17 15 18   ALT 42 34 25 19 18   ALKPHOS 77 92 82 71 69  BILITOT 0.7 0.4 0.7 0.7 1.0  PROT  6.4* 7.0 6.5 6.1* 6.1*  ALBUMIN 3.2* 3.4* 3.2* 3.0* 2.9*   Recent Labs  Lab 03/31/19 1042 04/04/19 0240  LIPASE 30 25   No results for input(s): AMMONIA in the last 168 hours. Coagulation Profile: No results for input(s): INR, PROTIME in the last 168 hours. Cardiac Enzymes: No results for input(s): CKTOTAL, CKMB, CKMBINDEX, TROPONINI in the last 168 hours. BNP (last 3 results) No results for input(s): PROBNP in the last 8760 hours. HbA1C: No results for input(s): HGBA1C in the last 72 hours. CBG: Recent Labs  Lab 04/03/19 2331 04/06/19 0622 04/06/19 1210  GLUCAP 99 100* 119*   Lipid Profile: No results for input(s): CHOL, HDL, LDLCALC, TRIG, CHOLHDL, LDLDIRECT in the last 72 hours. Thyroid Function Tests: Recent Labs    04/03/19 1800  TSH 4.761*   Anemia Panel: No results for input(s): VITAMINB12, FOLATE, FERRITIN, TIBC, IRON, RETICCTPCT in the last 72 hours. Sepsis Labs: Recent Labs  Lab 04/01/19 2100 04/01/19 2145  LATICACIDVEN 1.6 1.0    Recent Results (from the past 240 hour(s))  SARS CORONAVIRUS 2 (TAT 6-24 HRS) Nasopharyngeal Nasopharyngeal Swab     Status: Abnormal   Collection Time: 04/01/19  8:33 AM   Specimen: Nasopharyngeal Swab  Result Value Ref Range Status   SARS Coronavirus 2 POSITIVE (Debra Morgan) NEGATIVE Final    Comment: RESULT CALLED TO, READ BACK BY AND VERIFIED WITH: Precious Reel, ED RN BY EMAIL AT 1157 ON 04/01/19 BY C. JESSUP, MT. (NOTE) SARS-CoV-2 target nucleic acids are DETECTED. The SARS-CoV-2 RNA is generally detectable in upper and lower respiratory specimens during the acute phase of infection. Positive results are indicative of active infection with SARS-CoV-2. Clinical  correlation with patient history and other diagnostic information is necessary to determine patient infection status. Positive results do  not rule out bacterial infection or co-infection with other viruses. The expected result is Negative. Fact Sheet for Patients:  SugarRoll.be Fact Sheet for Healthcare Providers: https://www.woods-mathews.com/ This test is not yet approved or cleared by the [place] FDA and  has been authorized for detection and/or diagnosis of SARS-CoV-2 by FDA under an Emergency Use Authorization (EUA). This EUA will remain  in effect (meaning  this test can be used) for the duration of the COVID-19 declaration under Section 564(b)(1) of the Act, 21 U.S.C. section 360bbb-3(b)(1), unless the authorization is terminated or revoked sooner. Performed at [facility], [place] [address], [place], [place] [address]   Urine culture     Status: None   Collection Time: 04/01/19  8:23 PM   Specimen: Urine, Clean Catch  Result Value Ref Range Status   Specimen Description   Final    URINE, CLEAN CATCH Performed at [facility], [place] [address] [place], [place] [address]    Special Requests   Final    NONE Performed at [facility] [place] [address] [place], [place] [address]    Culture   Final    Multiple  bacterial morphotypes present, none predominant. Suggest appropriate recollection if clinically indicated.   Report Status 04/03/2019 FINAL  Final  SARS CORONAVIRUS 2 (TAT 6-24 HRS) Nasopharyngeal Nasopharyngeal Swab     Status: Abnormal   Collection Time: 04/03/19  8:43 PM   Specimen: Nasopharyngeal Swab  Result Value Ref Range Status   SARS Coronavirus 2 POSITIVE (Debra Morgan) NEGATIVE Final    Comment: RESULT CALLED TO, READ BACK BY AND VERIFIED WITH: RN Alesia Banda AT 8099 04/04/2019 BY L BENFIELD (NOTE) SARS-CoV-2 target nucleic acids are DETECTED. The SARS-CoV-2 RNA is generally detectable in upper and lower respiratory specimens during the acute phase of infection. Positive results are indicative of active infection with SARS-CoV-2. Clinical  correlation with patient history and other diagnostic information is necessary to determine patient  infection status. Positive results do  not rule out bacterial infection or co-infection with other viruses. The expected result is Negative. Fact Sheet for Patients: SugarRoll.be Fact Sheet for Healthcare Providers: https://www.woods-mathews.com/ This test is not yet approved or cleared by the [place] FDA and  has been authorized for detection and/or diagnosis of SARS-CoV-2 by FDA under an Emergency Use Authorization (EUA). This EUA will remain  in effect (meaning this test can be [place] ed) for the duration of the COVID-19 declaration under Section 564(b)(1) of the Act, 21 U.S.C. section 360bbb-3(b)(1), unless the authorization is terminated or revoked sooner. Performed at [facility], [place] [address] [place], [place] [address]          Radiology Studies: Dg Lumbar Spine 2-3 Views  Result Date: 04/04/2019 CLINICAL DATA:  Fall EXAM: LUMBAR SPINE - 2-3 VIEW COMPARISON:  02/04/2019 FINDINGS: Chronic severe compression deformity at L1 with status post vertebroplasty, stable. No acute fracture. Degenerative disc and facet disease throughout the lumbar spine. No subluxation. SI joints symmetric and unremarkable. IMPRESSION: No acute bony abnormality. Electronically Signed   By: Rolm Baptise M.D.   On: 04/04/2019 19:28   Dg Abd Portable 1v  Result Date: 04/04/2019 CLINICAL DATA:  Abdominal pain EXAM: PORTABLE ABDOMEN - 1 VIEW COMPARISON:  None. FINDINGS: The bowel gas pattern is normal. No radio-opaque calculi or other significant radiographic abnormality are seen. IMPRESSION: Negative. Electronically Signed   By: Kerby Moors M.D.   On: 04/04/2019 19:28        Scheduled Meds: . escitalopram  10 mg Oral Daily  . feeding supplement (ENSURE ENLIVE)  237 mL Oral BID BM  . ketorolac  15 mg Intravenous BID  . levothyroxine  100 mcg Oral Q0600  . multivitamin with minerals  1 tablet Oral Daily   Continuous Infusions:   LOS: 2  days    Time spent: over 30 min    Fayrene Helper, MD [facility] Pager AMION  If 7PM-7AM, please contact night-coverage [email or web address] Password [email or web address] 04/06/2019, 4:08 PM

## 2019-04-06 NOTE — Consult Note (Signed)
Telepsych Consultation   04/06/2019 2:16 PM Debra Morgan  MRN:  [id number] Principal Problem: Acute encephalopathy Discharge Diagnoses: Principal Problem:   Acute encephalopathy Active Problems:   Dystonia   Hypertension   COVID-19 virus infection   Hypothyroidism   Confusion   Encephalopathy acute   Generalized pain   Right leg weakness   Abdominal pain   Bradycardia   Visual hallucinations   Subjective: Patient complains of generalized body pain secondary to her dystonia, neuropathy and Parkinson. She reports feeling weak and does not want to go home. She is asking for help if she leaves from the hospital. She states she has been having hallucinations for quite some time that tell her "come with me." She is very vague about where they want her to go. She denies any command hallucinations to self harm herself or others. Patient with a histoy of depression and on Lexpro which she reports compliance. SHe denies having a psychiatrist. She denies suicidal ideation or previous suicide attempts.   Total Time spent with patient: 20 minutes  Past Psychiatric History:   Past Medical History:  Past Medical History:  Diagnosis Date  . Dystonia   . High cholesterol   . Hypertension   . Thyroid disease     Past Surgical History:  Procedure Laterality Date  . TUBAL LIGATION     Family History: History reviewed. No pertinent family history. Family Psychiatric  History: Denies Social History:  Social History   Substance and Sexual Activity  Alcohol Use Yes   Comment: social drinker, infrequent     Social History   Substance and Sexual Activity  Drug Use Never    Social History   Socioeconomic History  . Marital status: Married    Spouse name: Not on file  . Number of children: Not on file  . Years of education: Not on file  . Highest education level: Not on file  Occupational History  . Not on file  Social Needs  . Financial resource strain: Not on file  . Food  insecurity    Worry: Not on file    Inability: Not on file  . Transportation needs    Medical: Not on file    Non-medical: Not on file  Tobacco Use  . Smoking status: Never Smoker  . Smokeless tobacco: Never Used  Substance and Sexual Activity  . Alcohol use: Yes    Comment: social drinker, infrequent  . Drug use: Never  . Sexual activity: Not on file  Lifestyle  . Physical activity    Days per week: Not on file    Minutes per session: Not on file  . Stress: Not on file  Relationships  . Social Herbalist on phone: Not on file    Gets together: Not on file    Attends religious service: Not on file    Active member of club or organization: Not on file    Attends meetings of clubs or organizations: Not on file    Relationship status: Not on file  Other Topics Concern  . Not on file  Social History Narrative  . Not on file    Has this patient used any form of tobacco in the last 30 days? (Cigarettes, Smokeless Tobacco, Cigars, and/or Pipes) A prescription for an FDA-approved tobacco cessation medication was offered at discharge and the patient refused  Current Medications: Current Facility-Administered Medications  Medication Dose Route Frequency Provider Last Rate Last Dose  . acetaminophen (TYLENOL) tablet 650  mg  650 mg Oral Q6H PRN Eduard ClosKakrakandy, Arshad N, MD   650 mg at 04/04/19 1655   Or  . acetaminophen (TYLENOL) suppository 650 mg  650 mg Rectal Q6H PRN Eduard ClosKakrakandy, Arshad N, MD      . escitalopram (LEXAPRO) tablet 10 mg  10 mg Oral Daily Roberto ScalesNettey, Shayla D, MD   10 mg at 04/06/19 0818  . feeding supplement (ENSURE ENLIVE) (ENSURE ENLIVE) liquid 237 mL  237 mL Oral BID BM Roberto ScalesNettey, Shayla D, MD   237 mL at 04/06/19 1400  . ketorolac (TORADOL) 15 MG/ML injection 15 mg  15 mg Intravenous BID Roberto ScalesNettey, Shayla D, MD   15 mg at 04/06/19 0815  . labetalol (NORMODYNE) injection 10 mg  10 mg Intravenous Q2H PRN Eduard ClosKakrakandy, Arshad N, MD      . levothyroxine (SYNTHROID) tablet  100 mcg  100 mcg Oral Q0600 Roberto ScalesNettey, Shayla D, MD   100 mcg at 04/06/19 0514  . morphine 2 MG/ML injection 0.5 mg  0.5 mg Intravenous Q3H PRN Roberto ScalesNettey, Shayla D, MD      . multivitamin with minerals tablet 1 tablet  1 tablet Oral Daily Roberto ScalesNettey, Shayla D, MD   1 tablet at 04/06/19 0819  . ondansetron (ZOFRAN) tablet 4 mg  4 mg Oral Q6H PRN Eduard ClosKakrakandy, Arshad N, MD       Or  . ondansetron [facility](ZOFRAN) injection 4 mg  4 mg Intravenous Q6H PRN Eduard ClosKakrakandy, Arshad N, MD      . traMADol Janean Sark(ULTRAM) tablet 50 mg  50 mg Oral Q6H PRN Laverna PeaceNettey, Shayla D, MD       PTA Medications: Medications Prior to Admission  Medication Sig Dispense Refill Last Dose  . acetaminophen (TYLENOL) 500 MG tablet Take 1,000 mg by mouth every 6 (six) hours as needed for headache.    few days ago  . candesartan (ATACAND) 4 MG tablet Take 4 mg by mouth every morning.    04/03/2019 at am  . carvedilol (COREG) 6.25 MG tablet Take 1 tablet (6.25 mg total) by mouth 2 (two) times daily. 60 tablet 0 04/03/2019 at 1100  . escitalopram (LEXAPRO) 10 MG tablet Take 10 mg by mouth daily.   04/03/2019 at am  . ezetimibe (ZETIA) 10 MG tablet Take 1 tablet (10 mg total) by mouth daily. 30 tablet 0 04/03/2019 at am  . levothyroxine (SYNTHROID) 75 MCG tablet Take 75 mcg by mouth daily at 6 (six) AM.    04/03/2019 at am  . Multiple Vitamin (MULTIVITAMIN WITH MINERALS) TABS tablet Take 1 tablet by mouth daily.   04/03/2019 at am  . Multiple Vitamins-Minerals (AIRBORNE) TBEF Take 1 tablet by mouth daily as needed (immune system boost).   04/02/2019 at Unknown time  . Pseudoephedrine-Ibuprofen (ADVIL COLD/SINUS PO) Take 1 tablet by mouth daily as needed (cough/congestion).   few days ago  . triamterene-hydrochlorothiazide (MAXZIDE-25) 37.5-25 MG tablet Take 1 tablet by mouth every morning.    04/03/2019 at am  . escitalopram (LEXAPRO) 5 MG tablet Take 1 tablet (5 mg total) by mouth daily. (Patient not taking: Reported on 04/03/2019) 30 tablet 0 Not Taking at  Unknown time    Musculoskeletal: Strength & Muscle Tone: unable to assess Gait & Station: unable to assess Patient leans: unable to assess  Psychiatric Specialty Exam: Physical Exam  Nursing note and vitals reviewed. Constitutional: She is oriented to person, place, and time. She appears well-developed.  HENT:  Head: Normocephalic.  Cardiovascular: Normal rate.  Respiratory: Effort normal.  Neurological: She  is alert and oriented to person, place, and time.  Psychiatric: She has a normal mood and affect. Her behavior is normal. Judgment and thought content normal.    Review of Systems  Constitutional: Negative.   HENT: Negative.   Eyes: Negative.   Respiratory: Negative.   Cardiovascular: Negative.   Gastrointestinal: Negative.   Genitourinary: Negative.   Musculoskeletal: Negative.   Skin: Negative.   Neurological: Negative.   Endo/Heme/Allergies: Negative.     Blood pressure 107/60, pulse 74, temperature 99.4 F (37.4 C), temperature source Axillary, resp. rate 16, height 5\' 4"  (1.626 m), weight 73.6 kg, SpO2 95 %.Body mass index is 27.85 kg/m.  General Appearance: Casual  Eye Contact:  Good  Speech:  Clear and Coherent and Normal Rate  Volume:  Normal  Mood:  Depressed  Affect:  Depressed  Thought Process:  Coherent, Linear and Descriptions of Associations: Circumstantial  Orientation:  Full (Time, Place, and Person)  Thought Content:  Logical  Suicidal Thoughts:  Denies  Homicidal Thoughts:  Denies  Memory:  Immediate;   Fair Recent;   Fair Remote;   Fair  Judgement:  Fair  Insight:  Present  Psychomotor Activity:  Psychomotor Retardation  Concentration:  Concentration: Fair and Attention Span: Fair  Recall:  of Knowledge:  Fair  Language:  Fair  Akathisia:  Negative  Handed:  Right  AIMS (if indicated):     Assets:  Communication Skills Desire for Improvement Financial Resources/Insurance Housing Social Support  ADL's:  Intact   Cognition:  WNL  Sleep:        Demographic Factors:  Caucasian  Loss Factors: Loss of significant relationship and Decline in physical health  Historical Factors: NA  Risk Reduction Factors:   Sense of responsibility to family, Living with another person, especially a relative, Positive social support, Positive therapeutic relationship and Positive coping skills or problem solving skills  Continued Clinical Symptoms:  Chronic Pain More than one psychiatric diagnosis Medical Diagnoses and Treatments/Surgeries  Cognitive Features That Contribute To Risk:  None    Suicide Risk:  Minimal: No identifiable suicidal ideation.  Patients presenting with no risk factors but with morbid ruminations; may be classified as minimal risk based on the severity of the depressive symptoms   Plan Of Care/Follow-up recommendations: Follow up with outpatient psychiatry.  Disposition: Patient is psych cleared at this time. COntinue current medications. Suspect malingering due to chronic pain and frequent visits to the hospital, however the reason for malingering and secondary gain are unknown as she lives at home with a husband. Patient with chronic pain that could be managed outpatient by pain management if this is needed.   [employer], FNP 04/06/2019, 2:16 PM

## 2019-04-07 DIAGNOSIS — F411 Generalized anxiety disorder: Secondary | ICD-10-CM

## 2019-04-07 DIAGNOSIS — F333 Major depressive disorder, recurrent, severe with psychotic symptoms: Secondary | ICD-10-CM

## 2019-04-07 DIAGNOSIS — F449 Dissociative and conversion disorder, unspecified: Secondary | ICD-10-CM

## 2019-04-07 LAB — COMPREHENSIVE METABOLIC PANEL
ALT: 17 U/L (ref 0–44)
AST: 16 U/L (ref 15–41)
Albumin: 2.8 g/dL — ABNORMAL LOW (ref 3.5–5.0)
Alkaline Phosphatase: 70 U/L (ref 38–126)
Anion gap: 11 (ref 5–15)
BUN: 43 mg/dL — ABNORMAL HIGH (ref 8–23)
CO2: 26 mmol/L (ref 22–32)
Calcium: 9.5 mg/dL (ref 8.9–10.3)
Chloride: 102 mmol/L (ref 98–111)
Creatinine, Ser: 0.96 mg/dL (ref 0.44–1.00)
GFR calc Af Amer: >60 mL/min (ref >60)
GFR calc non Af Amer: 58 mL/min — ABNORMAL LOW (ref >60)
Glucose, Bld: 108 mg/dL — ABNORMAL HIGH (ref 70–99)
Potassium: 4.4 mmol/L (ref 3.5–5.1)
Sodium: 139 mmol/L (ref 135–145)
Total Bilirubin: 0.3 mg/dL (ref 0.3–1.2)
Total Protein: 5.9 g/dL — ABNORMAL LOW (ref 6.5–8.1)

## 2019-04-07 LAB — CBC
HCT: 35.2 % — ABNORMAL LOW (ref 36.0–46.0)
Hemoglobin: 11.4 g/dL — ABNORMAL LOW (ref 12.0–15.0)
MCH: 31.6 pg (ref 26.0–34.0)
MCHC: 32.4 g/dL (ref 30.0–36.0)
MCV: 97.5 fL (ref 80.0–100.0)
Platelets: 308 10*3/uL (ref 150–400)
RBC: 3.61 MIL/uL — ABNORMAL LOW (ref 3.87–5.11)
RDW: 13.2 % (ref 11.5–15.5)
WBC: 4.6 10*3/uL (ref 4.0–10.5)
nRBC: 0 % (ref 0.0–0.2)

## 2019-04-07 LAB — GLUCOSE, CAPILLARY
Glucose-Capillary: 101 mg/dL — ABNORMAL HIGH (ref 70–99)
Glucose-Capillary: 145 mg/dL — ABNORMAL HIGH (ref 70–99)
Glucose-Capillary: 89 mg/dL (ref 70–99)
Glucose-Capillary: 94 mg/dL (ref 70–99)

## 2019-04-07 LAB — FERRITIN: Ferritin: 425 ng/mL — ABNORMAL HIGH (ref 11–307)

## 2019-04-07 LAB — C-REACTIVE PROTEIN: CRP: 2.1 mg/dL — ABNORMAL HIGH (ref <1.0)

## 2019-04-07 LAB — MAGNESIUM: Magnesium: 2.1 mg/dL (ref 1.7–2.4)

## 2019-04-07 LAB — D-DIMER, QUANTITATIVE: D-Dimer, Quant: 0.84 ug/mL-FEU — ABNORMAL HIGH (ref 0.00–0.50)

## 2019-04-07 MED ORDER — ARIPIPRAZOLE 2 MG PO TABS
2.0000 mg | ORAL_TABLET | Freq: Every day | ORAL | Status: DC
  Administered 2019-04-07 – 2019-04-11 (×5): 2 mg via ORAL
  Filled 2019-04-07 (×7): qty 1

## 2019-04-07 MED ORDER — DULOXETINE HCL 20 MG PO CPEP
20.0000 mg | ORAL_CAPSULE | Freq: Every day | ORAL | Status: DC
  Administered 2019-04-08 – 2019-04-12 (×5): 20 mg via ORAL
  Filled 2019-04-07 (×6): qty 1

## 2019-04-07 NOTE — Evaluation (Signed)
Occupational Therapy Evaluation Patient Details Name: Debra Morgan MRN: [id number] DOB: 10/23/1945 Today's Date: 04/07/2019    History of Present Illness 73 y.o. female with history of hypertension, hyperlipidemia, hypothyroidism, dystonia was recently admitted on March 07, 2019 last month for COVID-19 infection subsequent to which patient has become increasingly encephalopathic with difficulty walking with bursts of agitation.EEG showed nonspecific cortical dysfunction in left temporal region MRI brain showed no acute abnormality   Clinical Impression   Pt with recent hospitalization at [facility], and since being sick has required mod A for ADL and has been getting around self-propelling her transport chair. She reports that she performs "bird baths" and that her husband does all IADL. Today, Pt presents with inconsistent congnition throughout session. At one point she stated "I think my name is Debra Morgan, and I can't remember where I am" but is able to perform multiple functional tasks (brushing hair, washing face, oral care) when provided with tools and no directions. She is VERY anxious and fearful of falling and after performing bed mobility at mod I and donning shoes without assist, she says "I can't stand up" with min A, gait belt, and face to face she was able to stand and pivot to recliner. Pt stated over and over again "I feel like I am going crazy" emotional support provided and Pt was watching TV in recliner with chair alarm activated at end of session.     Follow Up Recommendations  Supervision/Assistance - 24 hour    Equipment Recommendations  None recommended by OT    Recommendations for Other Services       Precautions / Restrictions Precautions Precautions: Fall;Other (comment)(apprehension with movement) Precaution Comments: h/o falls due to dystonia and R LE locking up Restrictions Weight Bearing Restrictions: No      Mobility Bed Mobility Overal bed mobility: Modified  Independent             General bed mobility comments: increased time and effort and use of bedrail to pull to EoB  Transfers Overall transfer level: Needs assistance Equipment used: 1 person hand held assist Transfers: Sit to/from Stand Sit to Stand: Min assist;From elevated surface         General transfer comment: minA for sit>stand, repeating "I can't do it", however able to power up and requires assist for steady,     Balance Overall balance assessment: Needs assistance Sitting-balance support: Feet supported;No upper extremity supported Sitting balance-Leahy Scale: Fair     Standing balance support: Bilateral upper extremity supported Standing balance-Leahy Scale: Poor                             ADL either performed or assessed with clinical judgement   ADL Overall ADL's : Needs assistance/impaired Eating/Feeding: Independent Eating/Feeding Details (indicate cue type and reason): per RN, Pt able to self-feed without any issues Grooming: Wash/dry hands;Wash/dry face;Brushing hair;Set up;Sitting Grooming Details (indicate cue type and reason): When Pt was handed grooming items, she completed without any kind of verbal cues Upper Body Bathing: Set up;Sitting   Lower Body Bathing: Moderate assistance;Sit to/from stand   Upper Body Dressing : Moderate assistance   Lower Body Dressing: Moderate assistance;Sit to/from stand Lower Body Dressing Details (indicate cue type and reason): Pt able to doff socks and don shoes sitting EOB Toilet Transfer: Minimal assistance;Stand-pivot Toilet Transfer Details (indicate cue type and reason): gait belt, face to face Toileting- Clothing Manipulation and Hygiene: Moderate assistance;Sit to/from stand  Functional mobility during ADLs: Minimal assistance(SPT only) General ADL Comments: Pt fearful of falling frequently stating "I can't" praise given when Pt was able to perform     Vision Baseline  Vision/History: (Pt has had cataract sx) Patient Visual Report: No change from baseline       Perception     Praxis      Pertinent Vitals/Pain Pain Assessment: Faces Faces Pain Scale: No hurt Pain Intervention(s): Monitored during session     Hand Dominance Right   Extremity/Trunk Assessment Upper Extremity Assessment Upper Extremity Assessment: Overall WFL for tasks assessed   Lower Extremity Assessment Lower Extremity Assessment: Defer to PT evaluation   Cervical / Trunk Assessment Cervical / Trunk Assessment: Other exceptions Cervical / Trunk Exceptions: per pt report h/o kyphoplasty after a fall in kitchen 3 yrs ago   Communication Communication Communication: No difficulties   Cognition Arousal/Alertness: Awake/alert Behavior During Therapy: Anxious Overall Cognitive Status: Impaired/Different from baseline Area of Impairment: Orientation;Memory;Awareness                 Orientation Level: Situation;Place("I don't know where I am, I can't remember my name")   Memory: Decreased short-term memory     Awareness: Emergent   General Comments: reports that she has dementia/Alz, and she frequently states that she cannot do things, but if you give her tasks to do without instruction she can complete them. She can tell me the president, the new president elect, that Thanksgiving is the next Holiday. She is tearful multiple times throughout session "I feel like I'm going crazy" Pt provided with comfort and encouragement   General Comments       Exercises General Exercises - Lower Extremity Ankle Circles/Pumps: AROM;Both;10 reps;Seated Long Arc Quad: AROM;Both;10 reps;Seated Hip Flexion/Marching: AROM;Both;10 reps;Seated   Shoulder Instructions      Home Living Family/patient expects to be discharged to:: Private residence Living Arrangements: Spouse/significant other Available Help at Discharge: Family;Available 24 hours/day Type of Home: House Home  Access: Ramped entrance     Home Layout: One level     Bathroom Shower/Tub: (takes bird baths )   Bathroom Toilet: Standard Bathroom Accessibility: Yes How Accessible: Accessible via walker Home Equipment: Walker - 4 wheels;Bedside commode;Transport chair(uses transport chair for mobility, has bed rail)          Prior Functioning/Environment Level of Independence: Needs assistance  Gait / Transfers Assistance Needed: husband assists with transfer to transfer chair, uses feet to propel  ADL's / Homemaking Assistance Needed: husband assist with transfer from transport chair to [facility] for bird baths            OT Problem List: Decreased activity tolerance;Impaired balance (sitting and/or standing)      OT Treatment/Interventions: Self-care/ADL training;Therapeutic exercise;Neuromuscular education;Energy conservation;DME and/or AE instruction;Therapeutic activities;Visual/perceptual remediation/compensation;Patient/family education    OT Goals(Current goals can be found in the care plan section) Acute Rehab OT Goals Patient Stated Goal: to figure out what is going on OT Goal Formulation: With patient Time For Goal Achievement: 04/21/19 Potential to Achieve Goals: Good ADL Goals Pt Will Perform Upper Body Dressing: with set-up;sitting Pt Will Perform Lower Body Dressing: with min assist;sitting/lateral leans;sit to/from stand Pt Will Transfer to Toilet: with supervision;stand pivot transfer;bedside commode Pt Will Perform Toileting - Clothing Manipulation and hygiene: with supervision;sitting/lateral leans Additional ADL Goal #1: Pt will independently verbalize 3 strategies to reduce risk of falls Additional ADL Goal #2: Pt will independently recall 3 strategies for reducing anxiety prior to transfers and engaging in  ADL activity  OT Frequency: Min 2X/week   Barriers to D/C:            Co-evaluation              AM-PAC OT "6 Clicks" Daily Activity     Outcome  Measure Help from another person eating meals?: None Help from another person taking care of personal grooming?: A Little Help from another person toileting, which includes using toliet, bedpan, or urinal?: A Lot Help from another person bathing (including washing, rinsing, drying)?: A Lot Help from another person to put on and taking off regular upper body clothing?: A Little Help from another person to put on and taking off regular lower body clothing?: A Little 6 Click Score: 17   End of Session Equipment Utilized During Treatment: Gait belt Nurse Communication: Mobility status  Activity Tolerance: Patient tolerated treatment well Patient left: in chair;with call bell/phone within reach;with chair alarm set  OT Visit Diagnosis: Unsteadiness on feet (R26.81);Other abnormalities of gait and mobility (R26.89);Muscle weakness (generalized) (M62.81);Other symptoms and signs involving cognitive function                Time: 1515-1545 OT Time Calculation (min): 30 min Charges:  OT General Charges $OT Visit: 1 Visit OT Evaluation $OT Eval Moderate Complexity: 1 Mod OT Treatments $Self Care/Home Management : 8-22 mins  Sherryl MangesLaura Lilliauna Van OTR/L Acute Rehabilitation Services Pager: [phone] Office: [phone]  Evern BioLaura J Derrick Tiegs 04/07/2019, 5:24 PM

## 2019-04-07 NOTE — Consult Note (Signed)
  Patient is seen and examined.  Patient is a 73 year old female previously seen on 04/06/2019 in psychiatric consultation.  Dr. Florene Glen requested to follow-up on her today.  Patient has a longstanding history of depression.  She had been on Lexapro for many years.  She stated that her mother died, her father remarried and she and his new wife did not get along.  She had been on that medication for many years.  She is at times somewhat somatic and attention seeking.  She discussed the fact that she was having some visual hallucinations as well.  These are mood congruent.  She is incredibly anxious and continues to ruminate about being able to get from the chair to the bed.  She stated that her husband is very supportive.  She denied any history of physical, emotional or sexual trauma.  She denied any previous psychiatric evaluations in the past, and the only medicine she could remember was the Lexapro.  She was alert and oriented x3, and discussed the fact that the occupational therapy evaluation earlier this afternoon was frustrating for the therapist because "I cannot get up and move to the bed".  She does feel a degree of helplessness, hopelessness and worthlessness.  She denied any suicidal ideation.  She is rather dramatic.  1.  Major depression, recurrent, severe with psychotic features, generalized anxiety disorder, dissociative events.  Patient is seen and examined.  I think probably switching her medications would be beneficial at least at this time.  I am going to stop the Lexapro.  We will start Cymbalta 20 mg p.o. daily tomorrow.  As well for augmentation purposes as well as some of these visual illusions I will add 2 mg of Abilify at bedtime.  Hopefully this will be of benefit.

## 2019-04-08 LAB — CBC WITH DIFFERENTIAL/PLATELET
Abs Immature Granulocytes: 0.01 10*3/uL (ref 0.00–0.07)
Basophils Absolute: 0 10*3/uL (ref 0.0–0.1)
Basophils Relative: 1 %
Eosinophils Absolute: 0.3 10*3/uL (ref 0.0–0.5)
Eosinophils Relative: 8 %
HCT: 34.3 % — ABNORMAL LOW (ref 36.0–46.0)
Hemoglobin: 11.4 g/dL — ABNORMAL LOW (ref 12.0–15.0)
Immature Granulocytes: 0 %
Lymphocytes Relative: 35 %
Lymphs Abs: 1.5 10*3/uL (ref 0.7–4.0)
MCH: 32.1 pg (ref 26.0–34.0)
MCHC: 33.2 g/dL (ref 30.0–36.0)
MCV: 96.6 fL (ref 80.0–100.0)
Monocytes Absolute: 0.4 10*3/uL (ref 0.1–1.0)
Monocytes Relative: 9 %
Neutro Abs: 2 10*3/uL (ref 1.7–7.7)
Neutrophils Relative %: 47 %
Platelets: 321 10*3/uL (ref 150–400)
RBC: 3.55 MIL/uL — ABNORMAL LOW (ref 3.87–5.11)
RDW: 13 % (ref 11.5–15.5)
WBC: 4.2 10*3/uL (ref 4.0–10.5)
nRBC: 0 % (ref 0.0–0.2)

## 2019-04-08 LAB — COMPREHENSIVE METABOLIC PANEL
ALT: 16 U/L (ref 0–44)
AST: 17 U/L (ref 15–41)
Albumin: 2.9 g/dL — ABNORMAL LOW (ref 3.5–5.0)
Alkaline Phosphatase: 63 U/L (ref 38–126)
Anion gap: 9 (ref 5–15)
BUN: 41 mg/dL — ABNORMAL HIGH (ref 8–23)
CO2: 29 mmol/L (ref 22–32)
Calcium: 9.5 mg/dL (ref 8.9–10.3)
Chloride: 101 mmol/L (ref 98–111)
Creatinine, Ser: 1.1 mg/dL — ABNORMAL HIGH (ref 0.44–1.00)
GFR calc Af Amer: 57 mL/min — ABNORMAL LOW (ref >60)
GFR calc non Af Amer: 49 mL/min — ABNORMAL LOW (ref >60)
Glucose, Bld: 105 mg/dL — ABNORMAL HIGH (ref 70–99)
Potassium: 4.4 mmol/L (ref 3.5–5.1)
Sodium: 139 mmol/L (ref 135–145)
Total Bilirubin: 0.6 mg/dL (ref 0.3–1.2)
Total Protein: 6 g/dL — ABNORMAL LOW (ref 6.5–8.1)

## 2019-04-08 LAB — FERRITIN: Ferritin: 433 ng/mL — ABNORMAL HIGH (ref 11–307)

## 2019-04-08 LAB — GLUCOSE, CAPILLARY
Glucose-Capillary: 99 mg/dL (ref 70–99)
Glucose-Capillary: 102 mg/dL — ABNORMAL HIGH (ref 70–99)

## 2019-04-08 LAB — D-DIMER, QUANTITATIVE: D-Dimer, Quant: 0.8 ug/mL-FEU — ABNORMAL HIGH (ref 0.00–0.50)

## 2019-04-08 LAB — C-REACTIVE PROTEIN: CRP: 1.3 mg/dL — ABNORMAL HIGH (ref <1.0)

## 2019-04-08 LAB — HEMOGLOBIN A1C
Hgb A1c MFr Bld: 5.8 % — ABNORMAL HIGH (ref 4.8–5.6)
Mean Plasma Glucose: 119.76 mg/dL

## 2019-04-08 LAB — MAGNESIUM: Magnesium: 2 mg/dL (ref 1.7–2.4)

## 2019-04-08 NOTE — Evaluation (Signed)
Speech Language Pathology Evaluation Patient Details Name: Danalee Flath MRN: [id number] DOB: [birth year] Today's Date: 04/08/2019 Time: 1015-1100 SLP Time Calculation (min) (ACUTE ONLY): 45 min  Problem List:  Patient Active Problem List   Diagnosis Date Noted  . Visual hallucinations 04/05/2019  . Confusion 04/04/2019  . Encephalopathy acute 04/04/2019  . Generalized pain 04/04/2019  . Right leg weakness 04/04/2019  . Abdominal pain 04/04/2019  . Bradycardia 04/04/2019  . Acute encephalopathy 04/03/2019  . Hypothyroidism 04/03/2019  . Major depressive disorder, recurrent episode, moderate ([place]) 03/21/2019  . Generalized weakness 03/16/2019  . Thyroid disease   . Dystonia   . Pneumonia due to COVID-19 virus   . Elevated transaminase level   . Hypertension   . Chest pain   . COVID-19 virus infection    Past Medical History:  Past Medical History:  Diagnosis Date  . Dystonia   . High cholesterol   . Hypertension   . Thyroid disease    Past Surgical History:  Past Surgical History:  Procedure Laterality Date  . TUBAL LIGATION     HPI:  Ethelyn Cerniglia is a 73 y.o. year old female with medical history significant for HTN, HLD, hypothyroidism, movement disorder/dystonia, wheelchair-bound, depression, tested positive for Covid on 10/16 (in [place]), hospitalized at [facility] 10/22-10/26 and treated with Decadron for COVID-19 pneumonia without hypoxia, admitted at home 11/7 for generalized weakness, decreased mobility and transfers in the setting of chronic dystonia, peripheral neuropathy complicated by metabolic encephalopathy.  At that time decline in her physical functional status thought to be a consequence of her recent COVID-19 viral infection: Negative work-up for acute CVA including head CT, CTA. She now presents with worsening confusion, agitation, answering questions inappropriately and not being oriented on 04/03/2019. Psychiatry has changed medication given concern for  depression and anxiety.    Assessment / Plan / Recommendation Clinical Impression  Several cognitive linguistic tasks presented to pt including clock drawing, serial subtraction, check writing, long and short term memory tasks. Pt demonstrated adequate speech, language, visuospatial skills, organization, sequencing, memory and reasoning WNL. Despite this she repeatedly hesitated to initiate all tasks stating "I just cant do it' repeatedly. She consistently finished tasks correctly and then stated, "See I cant do it." There were some instances that pt needed min verbal cueing to initiate problem solving difficult tasks, but then utilized techniques correctly. She was adamant that she probably has dementia. SLP attempted to reassure pt that the most important recommendation is that she attempt tasks rather than assuming failure, to give her opportunity to challenge herself mentally. Overall, would not recommend SLP interventions prior to complete neurologic diagnositc w/u as an OP given that there is no strong evidence for persistent cognitive impairment at this time. If pt were diagnosed with dementia or other consistent impairment intervention as an OP would be recommended. Will sign off at this time.     SLP Assessment  SLP Recommendation/Assessment: Patient does not need any further Speech Lanaguage Pathology Services    Follow Up Recommendations       Frequency and Duration           SLP Evaluation Cognition  Overall Cognitive Status: Within Functional Limits for tasks assessed Arousal/Alertness: Awake/alert Orientation Level: Oriented X4 Attention: Alternating Alternating Attention: Appears intact Memory: Appears intact Awareness: Appears intact Problem Solving: Impaired Problem Solving Impairment: Functional basic Executive Function: Self Monitoring Self Monitoring: Impaired Self Monitoring Impairment: Verbal complex;Functional complex Behaviors: Poor frustration tolerance        Comprehension  Auditory Comprehension Overall Auditory Comprehension: Appears within functional limits for tasks assessed Reading Comprehension Reading Status: Within funtional limits    Expression Verbal Expression Overall Verbal Expression: Appears within functional limits for tasks assessed Written Expression Dominant Hand: Right   Oral / Motor  Oral Motor/Sensory Function Overall Oral Motor/Sensory Function: Within functional limits Motor Speech Overall Motor Speech: Appears within functional limits for tasks assessed   GO                   Harlon Ditty, MA CCC-SLP  Acute Rehabilitation Services Pager [phone] Office [phone]  Claudine Mouton 04/08/2019, 12:56 PM

## 2019-04-08 NOTE — Progress Notes (Signed)
PROGRESS NOTE    Debra Morgan  EXB:[id number] DOB: 02-Feb-1946 DOA: 04/03/2019 PCP: Karleen Hampshire., MD   Brief Narrative:   Debra Morgan is Debra Morgan 73 y.o. year old female with medical history significant for HTN, HLD, hypothyroidism, movement disorder/dystonia, wheelchair-bound, depression, tested positive for Covid on 10/16 (in [place]), hospitalized at [facility] 10/22-10/26 and treated with Decadron for COVID-19 pneumonia without hypoxia, admitted at home 11/7 for generalized weakness, decreased mobility and transfers in the setting of chronic dystonia, peripheral neuropathy complicated by metabolic encephalopathy.  At that time decline in her physical functional status thought to be Debra Morgan consequence of her recent COVID-19 viral infection: Negative work-up for acute CVA including head CT, CTA.  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  BuSpar was discontinued.    She now presents with worsening confusion, agitation, answering questions inappropriately and not being oriented on 04/03/2019.  Assessment & Plan:   Principal Problem:   Acute encephalopathy Active Problems:   Dystonia   Hypertension   COVID-19 virus infection   Hypothyroidism   Confusion   Encephalopathy acute   Generalized pain   Right leg weakness   Abdominal pain   Bradycardia   Visual hallucinations  Waxing and waning confusion, suspect combination of Covid encephalopathy with hospital-acquired delirium and somatization.   - Workup at this point without notable abnormalities - MRI without acute abnormality - EEG with nonspecific cortical dysfunction in L temporal region.  No seizures or epileptiform discharges were seen throughout the recording.  - her ESR was elevated (90), CRP negative    - urine cx with multiple bacterial morphotypes, none predominant - she's been afebrile - some concern for malingering by previous providers as well - Neurology has seen patient and is concerned for  COVID encephalopathy (which can be prolonged) - now signed off - will have pt continue with PT/OT and speech therapy - per speech, pt consistently hesitated to start task and then consistently finished tasks correctly - with PT and OT she was able to do more than she thought as well. - delirium precautions - psych has been consulted - no need for inpatient psych and recommend outpatient psych - they note malingering possible (see note) (though based on conversation with family, doesn't sound c/w this).  I've asked psychiatry to see her again today with concern for some degree of somatization -> recommended cymbalta and abilify.  Will continue to monitor.  - Seems improved today, hopeful for d/c on 11/21 - Family requested transfer for second opinion.  I called [facility] and [facility] who both declined transfer.  Persistent right lower extremity pain, seems to be chronic.   Has diminished trunk and right foot which is also chronic limited dystonia.  Lumbar x-ray shows chronic compression fracture without any evidence of acute pathology. -Continue scheduled IV Toradol for pain control -PT to evaluate--recommends 24 H assist/HHPT  Generalized abdominal pain, slightly improved.   AST ALT, T bili all unremarkable, mild nausea but tolerating oral diet..  Mild nausea without vomiting.  Tolerating oral intake.  Abdominal x-ray negative -Optimize bowel regimen, close monitor, antiemetics as needed.    Dyspnea with minimal exertion.   Patient is not hypoxic, chest x-ray shows improvement in residual opacities.  Do not suspect new COVID 19 infection.  Patient not very active, doubt PE, D-dimer unremarkable venous duplex on 11/1 - for DVT.  BMP unremarkable -Unsure the need for ambulatory O2 saturations given patient is wheelchair-bound  Recent COVID-19 infection (10/19)  with pneumonia.   No viral symptoms.  On room air.  Chest imaging shows improvement in bilateral airspace opacities.  Doubt new infection.  Repeat  was obtained in ED, D-dimer unremarkable doubt PE -Airborne/contact precautions -discussed case with Dr. Baxter Flattery, as patient tested positive on 10/16, can discontinue precautions.  Hypothyroidism.   TSH 4.7 on admission. -Increase home Synthroid from 75 to 100 mcg, encourage taking first thing in the morning without other medications -Will need close outpatient follow-up with PCP for repeat TSH  Bradycardia, stable Coreg was discontinued on previous hospitalization.  Worsen night -Monitor on telemetry  Chronic movement disorder/dystonia.  Chronically wheelchair-bound.  Per neuro note from [facility], 4-5 yr hx of progressive freezing of gait most prominent in R leg - following with neurology outpatient. -Continue Cogentin  HTN, stable Some mild relative hypotension with SBP's in the 100s on admission.  Currently normotensive. 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   Lexapro 10 mg daily ordered.  Reporting visual hallucinations, could be related to infectious encephalopathy related to Covid infection, given psych history also concern may need some adjustments in medications -Now on abilify and cymbalta per psych as noted above -Psych c/s as above  DVT prophylaxis: SCD Code Status:full  Family Communication: husband on 11/20 - he's interested in SNF placement - will consult social work. Disposition Plan: pending further improvement   Consultants:   neurology  Procedures:  EEG IMPRESSION: This study is suggestive of nonspecific cortical dysfunction in left temporal region. No seizures or epileptiform discharges were seen throughout the recording.   Antimicrobials:  Anti-infectives (From admission, onward)   None      Subjective: Less confused today Debra Morgan&Ox4  Objective: Vitals:   04/07/19 1453 04/07/19 2347 04/08/19 0837 04/08/19 1501  BP: 130/61 124/77 134/83 (!) 129/92  Pulse: 62 68 72 94  Resp: 16   16  Temp: 99.1 F (37.3 C) 98.4 F (36.9 C) 98.7 F (37.1 C) 99.2 F (37.3 C)  TempSrc: Axillary Oral Oral Axillary  SpO2: 96% 97% 96% 96%  Weight:      Height:        Intake/Output Summary (Last 24 hours) at 04/08/2019 1918 Last data filed at 04/08/2019 0526 Gross per 24 hour  Intake -  Output 550 ml  Net -550 ml   Filed Weights   04/03/19 1655 04/05/19 0139  Weight: 75.6 kg 73.6 kg    Examination:  General: No acute distress. Cardiovascular: RRR Lungs: unlabored Abdomen: Soft, nontender, nondistended. Neurological: Alert and oriented 3. Moves all extremities 4. Cranial nerves II through XII grossly intact. Skin: Warm and dry. No rashes or lesions. Extremities: No clubbing or cyanosis. No edema.   Data Reviewed: I have personally reviewed following labs and imaging studies  CBC: Recent Labs  Lab 04/01/19 2100 04/03/19 1800 04/04/19 0120 04/05/19 0409 04/06/19 0408 04/07/19 0634 04/08/19 0535  WBC 5.3 5.5 4.7 5.8 4.2 4.6 4.2  NEUTROABS 3.0 3.2  --   --   --   --  2.0  HGB 12.4 13.0 12.2 11.7* 11.7* 11.4* 11.4*  HCT 39.2 39.8 37.9 35.6* 35.9* 35.2* 34.3*  MCV 100.8* 98.0 99.0 97.5 96.8 97.5 96.6  PLT 274 293 285 285 275 308 902   Basic Metabolic Panel: Recent Labs  Lab 04/04/19 0120 04/05/19 0409 04/06/19 0408 04/07/19  2956 04/08/19 0535  NA 137 138 138 139 139  K 3.6 4.1 4.1 4.4 4.4  CL 102 101 103 102 101  CO2 23 23 24 26 29   GLUCOSE 111* 95 106* 108* 105*  BUN 14 23 38* 43* 41*  CREATININE 0.71 0.98 0.90 0.96 1.10*  CALCIUM 9.3 9.2 9.5 9.5 9.5  MG  --   --   --  2.1 2.0   GFR: Estimated Creatinine Clearance: 44.1 mL/min (Debra Morgan) (by C-G formula based on SCr of 1.1 mg/dL (H)). Liver Function Tests: Recent Labs  Lab 04/03/19 1800 04/05/19 0409 04/06/19  0408 04/07/19 0634 04/08/19 0535  AST 17 15 18 16 17   ALT 25 19 18 17 16   ALKPHOS 82 71 69 70 63  BILITOT 0.7 0.7 1.0 0.3 0.6  PROT 6.5 6.1* 6.1* 5.9* 6.0*  ALBUMIN 3.2* 3.0* 2.9* 2.8* 2.9*   Recent Labs  Lab 04/04/19 0240  LIPASE 25   No results for input(s): AMMONIA in the last 168 hours. Coagulation Profile: No results for input(s): INR, PROTIME in the last 168 hours. Cardiac Enzymes: No results for input(s): CKTOTAL, CKMB, CKMBINDEX, TROPONINI in the last 168 hours. BNP (last 3 results) No results for input(s): PROBNP in the last 8760 hours. HbA1C: Recent Labs    04/08/19 0535  HGBA1C 5.8*   CBG: Recent Labs  Lab 04/07/19 1234 04/07/19 1846 04/07/19 2345 04/08/19 0523 04/08/19 1313  GLUCAP 145* 94 101* 99 102*   Lipid Profile: No results for input(s): CHOL, HDL, LDLCALC, TRIG, CHOLHDL, LDLDIRECT in the last 72 hours. Thyroid Function Tests: No results for input(s): TSH, T4TOTAL, FREET4, T3FREE, THYROIDAB in the last 72 hours. Anemia Panel: Recent Labs    04/07/19 0634 04/08/19 0535  FERRITIN 425* 433*   Sepsis Labs: Recent Labs  Lab 04/01/19 2100 04/01/19 2145  LATICACIDVEN 1.6 1.0    Recent Results (from the past 240 hour(s))  SARS CORONAVIRUS 2 (TAT 6-24 HRS) Nasopharyngeal Nasopharyngeal Swab     Status: Abnormal   Collection Time: 04/01/19  8:33 AM   Specimen: Nasopharyngeal Swab  Result Value Ref Range Status   SARS Coronavirus 2 POSITIVE (Debra Morgan) NEGATIVE Final    Comment: RESULT CALLED TO, READ BACK BY AND VERIFIED WITH: Debra Morgan, ED RN BY EMAIL AT 2130 ON 04/01/19 BY C. JESSUP, MT. (NOTE) SARS-CoV-2 target nucleic acids are DETECTED. The SARS-CoV-2 RNA is generally detectable in upper and lower respiratory specimens during the acute phase of infection. Positive results are indicative of active infection with SARS-CoV-2. Clinical  correlation with patient history and other diagnostic information is necessary to determine patient infection  status. Positive results do  not rule out bacterial infection or co-infection with other viruses. The expected result is Negative. Fact Sheet for Patients: SugarRoll.be Fact Sheet for Healthcare Providers: https://www.woods-mathews.com/ This test is not yet approved or cleared by the [place] FDA and  has been authorized for detection and/or diagnosis of SARS-CoV-2 by FDA under an Emergency Use Authorization (EUA). This EUA will remain  in effect (meaning  this test can be used) for the duration of the COVID-19 declaration under Section 564(b)(1) of the Act, 21 U.S.C. section 360bbb-3(b)(1), unless the authorization is terminated or revoked sooner. Performed at [facility], [place] [address] [place], [place] [address]   Urine culture     Status: None   Collection Time: 04/01/19  8:23 PM   Specimen: Urine, Clean Catch  Result Value Ref Range Status   Specimen Description  Final    URINE, CLEAN CATCH Performed at [facility], [place] [address], [place], [place] [address]    Special Requests   Final    NONE Performed at [facility], [place] [address], [place], [place] [address]    Culture   Final    Multiple bacterial morphotypes present, none predominant. Suggest appropriate recollection if clinically indicated.   Report Status 04/03/2019 FINAL  Final  SARS CORONAVIRUS 2 (TAT 6-24 HRS) Nasopharyngeal Nasopharyngeal Swab     Status: Abnormal   Collection Time: 04/03/19  8:43 PM   Specimen: Nasopharyngeal Swab  Result Value Ref Range Status   SARS Coronavirus 2 POSITIVE (Debra Morgan) NEGATIVE Final    Comment: RESULT CALLED TO, READ BACK BY AND VERIFIED WITH: RN Alesia Banda AT 8138 04/04/2019 BY L BENFIELD (NOTE) SARS-CoV-2 target nucleic acids are DETECTED. The SARS-CoV-2 RNA is generally detectable in upper and lower respiratory specimens during the acute phase of infection. Positive results are  indicative of active infection with SARS-CoV-2. Clinical  correlation with patient history and other diagnostic information is necessary to determine patient infection status. Positive results do  not rule out bacterial infection or co-infection with other viruses. The expected result is Negative. Fact Sheet for Patients: SugarRoll.be Fact Sheet for Healthcare Providers: https://www.woods-mathews.com/ This test is not yet approved or cleared by the [place] FDA and  has been authorized for detection and/or diagnosis of SARS-CoV-2 by FDA under an Emergency Use Authorization (EUA). This EUA will remain  in effect (meaning this test can be [place] ed) for the duration of the COVID-19 declaration under Section 564(b)(1) of the Act, 21 U.S.C. section 360bbb-3(b)(1), unless the authorization is terminated or revoked sooner. Performed at [facility], [place] [address] [place], [place] [address]          Radiology Studies: No results found.      Scheduled Meds: . ARIPiprazole  2 mg Oral QHS  . DULoxetine  20 mg Oral Daily  . feeding supplement (ENSURE ENLIVE)  237 mL Oral BID BM  . ketorolac  15 mg Intravenous BID  . levothyroxine  100 mcg Oral Q0600  . multivitamin with minerals  1 tablet Oral Daily   Continuous Infusions:   LOS: 4 days    Time spent: over 30 min    Fayrene Helper, MD [facility] Pager AMION  If 7PM-7AM, please contact night-coverage [email or web address] Password [facility] 04/08/2019, 7:18 PM

## 2019-04-08 NOTE — Progress Notes (Signed)
Physical Therapy Treatment Patient Details Name: Debra Morgan MRN: [id number] DOB: [birth year] Today's Date: 04/08/2019    History of Present Illness 73 y.o. female with history of hypertension, hyperlipidemia, hypothyroidism, dystonia was recently admitted on March 07, 2019 last month for COVID-19 infection subsequent to which patient has become increasingly encephalopathic with difficulty walking with bursts of agitation.EEG showed nonspecific cortical dysfunction in left temporal region MRI brain showed no acute abnormality    PT Comments    Pt continues to constantly state "I can't" with all activities. She says this regardless of the difficulty. For example she does fairly well with sit to stand with walker but constantly says "I can't". Taking steps is more difficult and pt requires more assist  but says the same "I can't". Doesn't seem to be able to gauge which activities she can perform vs those which she truly can't or requires more assistance. At baseline pt was requiring assist at home. Recommend home as long as family can continue to assist.   Follow Up Recommendations  Home health PT;Supervision/Assistance - 24 hour     Equipment Recommendations  None recommended by PT    Recommendations for Other Services       Precautions / Restrictions Precautions Precautions: Fall;Other (comment)(apprehension with movement) Precaution Comments: h/o falls due to dystonia and R LE locking up Restrictions Weight Bearing Restrictions: No    Mobility  Bed Mobility Overal bed mobility: Modified Independent             General bed mobility comments: Incr time and effort and use of rail  Transfers Overall transfer level: Needs assistance Equipment used: 1 person hand held assist;Rolling walker (2 wheeled) Transfers: Sit to/from Stand Sit to Stand: Min assist;Min guard Stand pivot transfers: Mod assist       General transfer comment: Assist to bring hips up and for balance.  Stood from bed with forearm support and min assist. Bed to chair with mod assist with pt with difficulty moving feet. Stood from recliner repeatedly with walker with min guard.   Ambulation/Gait Ambulation/Gait assistance: Min assist Gait Distance (Feet): 3 Feet(forward and backward) Assistive device: Rolling walker (2 wheeled) Gait Pattern/deviations: Step-to pattern;Shuffle;Decreased step length - right;Decreased step length - left Gait velocity: slowed Gait velocity interpretation: <1.31 ft/sec, indicative of household ambulator General Gait Details: Assist for balance and support. Max encouragement.   Stairs             Wheelchair Mobility    Modified Rankin (Stroke Patients Only)       Balance Overall balance assessment: Needs assistance Sitting-balance support: Feet supported;No upper extremity supported Sitting balance-Leahy Scale: Fair     Standing balance support: Bilateral upper extremity supported Standing balance-Leahy Scale: Poor Standing balance comment: walker and min guard for static standing                            Cognition Arousal/Alertness: Awake/alert Behavior During Therapy: Anxious Overall Cognitive Status: No family/caregiver present to determine baseline cognitive functioning                                        Exercises      General Comments        Pertinent Vitals/Pain Pain Assessment: Faces Faces Pain Scale: No hurt    Home Living     Available Help at Discharge: Family;Available  24 hours/day                Prior Function            PT Goals (current goals can now be found in the care plan section) Progress towards PT goals: Progressing toward goals    Frequency    Min 3X/week      PT Plan Current plan remains appropriate;Frequency needs to be updated    Co-evaluation              AM-PAC PT "6 Clicks" Mobility   Outcome Measure  Help needed turning from your back  to your side while in a flat bed without using bedrails?: A Little Help needed moving from lying on your back to sitting on the side of a flat bed without using bedrails?: A Little Help needed moving to and from a bed to a chair (including a wheelchair)?: A Lot Help needed standing up from a chair using your arms (e.g., wheelchair or bedside chair)?: A Little Help needed to walk in hospital room?: Total Help needed climbing 3-5 steps with a railing? : Total 6 Click Score: 13    End of Session Equipment Utilized During Treatment: Gait belt Activity Tolerance: Other (comment)(self limiting) Patient left: in chair;with call bell/phone within reach Nurse Communication: Mobility status(need for purewick placement) PT Visit Diagnosis: Muscle weakness (generalized) (M62.81);Difficulty in walking, not elsewhere classified (R26.2);History of falling (Z91.81)     Time: 3790-2409 PT Time Calculation (min) (ACUTE ONLY): 25 min  Charges:  $Therapeutic Activity: 23-37 mins                     Day Pager [phone] Office [place] 04/08/2019, 3:16 PM

## 2019-04-09 MED ORDER — POLYETHYLENE GLYCOL 3350 17 G PO PACK
17.0000 g | PACK | Freq: Two times a day (BID) | ORAL | Status: DC
  Administered 2019-04-09 – 2019-04-11 (×3): 17 g via ORAL
  Filled 2019-04-09 (×6): qty 1

## 2019-04-09 NOTE — Progress Notes (Signed)
PROGRESS NOTE    Debra Morgan  HMC:[id number] DOB: 13-Feb-1946 DOA: 04/03/2019 PCP: Karleen Hampshire., MD   Brief Narrative:   Debra Morgan is a 73 y.o. year old female with medical history significant for HTN, HLD, hypothyroidism, movement disorder/dystonia, wheelchair-bound, depression, tested positive for Covid on 10/16 (in [place]), hospitalized at [facility] 10/22-10/26 and treated with Decadron for COVID-19 pneumonia without hypoxia, admitted at home 11/7 for generalized weakness, decreased mobility and transfers in the setting of chronic dystonia, peripheral neuropathy complicated by metabolic encephalopathy.  At that time decline in her physical functional status thought to be a consequence of her recent COVID-19 viral infection: Negative work-up for acute CVA including head CT, CTA.  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  BuSpar was discontinued.    She now presents with worsening confusion, agitation, answering questions inappropriately and not being oriented on 04/03/2019.  Assessment & Plan:   Principal Problem:   Acute encephalopathy Active Problems:   Dystonia   Hypertension   COVID-19 virus infection   Hypothyroidism   Confusion   Encephalopathy acute   Generalized pain   Right leg weakness   Abdominal pain   Bradycardia   Visual hallucinations  Waxing and waning confusion, suspect combination of Covid encephalopathy with hospital-acquired delirium and somatization.   - Workup at this point without notable abnormalities - MRI without acute abnormality - EEG with nonspecific cortical dysfunction in L temporal region.  No seizures or epileptiform discharges were seen throughout the recording.  - her ESR was elevated (90), CRP negative    - urine cx with multiple bacterial morphotypes, none predominant - she's been afebrile - some concern for malingering by previous providers as well - Neurology has seen patient and is concerned for  COVID encephalopathy (which can be prolonged) - now signed off - will have pt continue with PT/OT and speech therapy - per speech, pt consistently hesitated to start task and then consistently finished tasks correctly - with PT and OT she was able to do more than she thought as well. - delirium precautions - psych has been consulted - no need for inpatient psych and recommend outpatient psych - they note malingering possible (see note) (though based on conversation with family, doesn't sound c/w this).  I've asked psychiatry to see her again today with concern for some degree of somatization -> recommended cymbalta and abilify.  Will continue to monitor.  - She's improved at this time -> plan for discharge when able - family requesting SNF placement - Family requested transfer for second opinion.  I called [facility] and [facility] who both declined transfer.  Persistent right lower extremity pain, seems to be chronic.   Has diminished trunk and right foot which is also chronic limited dystonia.  Lumbar x-ray shows chronic compression fracture without any evidence of acute pathology. -Continue scheduled IV Toradol for pain control -PT to evaluate--recommends 24 H assist/HHPT  Generalized abdominal pain, slightly improved.   AST ALT, T bili all unremarkable, mild nausea but tolerating oral diet..  Mild nausea without vomiting.  Tolerating oral intake.  Abdominal x-ray negative -Optimize bowel regimen, close monitor, antiemetics as needed.    Dyspnea with minimal exertion.   Patient is not hypoxic, chest x-ray shows improvement in residual opacities.  Do not suspect new COVID 19 infection.  Patient not very active, doubt PE, D-dimer unremarkable venous duplex on 11/1 - for DVT.  BMP unremarkable -Unsure the need for ambulatory O2 saturations given  patient is wheelchair-bound  Recent COVID-19 infection (10/19) with pneumonia.   No viral symptoms.  On room air.  Chest imaging shows improvement in bilateral  airspace opacities.  Doubt new infection.  Repeat was obtained in ED, D-dimer unremarkable doubt PE -Airborne/contact precautions -discussed case with Dr. Baxter Flattery, as patient tested positive on 10/16, can discontinue precautions.  Hypothyroidism.   TSH 4.7 on admission. -Increase home Synthroid from 75 to 100 mcg, encourage taking first thing in the morning without other medications -Will need close outpatient follow-up with PCP for repeat TSH  Bradycardia, stable Coreg was discontinued on previous hospitalization.  Worsen night -Monitor on telemetry  Chronic movement disorder/dystonia.  Chronically wheelchair-bound.  Per neuro note from [facility], 4-5 yr hx of progressive freezing of gait most prominent in R leg - following with neurology outpatient. -Continue Cogentin  HTN, stable Some mild relative hypotension with SBP's in the 100s on admission.  Currently normotensive. 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   Lexapro 10 mg daily ordered.  Reporting visual hallucinations, could be related to infectious encephalopathy related to Covid infection, given psych history also concern may need some adjustments in medications -Now on abilify and cymbalta per psych as noted above -Psych c/s as above  DVT prophylaxis: SCD Code Status:full  Family Communication: husband on 11/20 - he's interested in SNF placement - will consult social work. Disposition Plan: pending further improvement   Consultants:   neurology  Procedures:  EEG IMPRESSION: This study is suggestive of nonspecific cortical dysfunction in left temporal region. No seizures or epileptiform discharges were seen throughout the recording.   Antimicrobials:   Anti-infectives (From admission, onward)   None     Subjective: A&Ox4 She's anxious  Objective: Vitals:   04/08/19 1501 04/08/19 2107 04/09/19 0514 04/09/19 1558  BP: (!) 129/92 133/79 135/76 123/62  Pulse: 94 66 66 63  Resp: 16   18  Temp: 99.2 F (37.3 C) (!) 97.5 F (36.4 C) 97.6 F (36.4 C) (!) 97.5 F (36.4 C)  TempSrc: Axillary Oral Oral Oral  SpO2: 96% 96% 97% 97%  Weight:      Height:        Intake/Output Summary (Last 24 hours) at 04/09/2019 1705 Last data filed at 04/09/2019 1014 Gross per 24 hour  Intake 480 ml  Output 250 ml  Net 230 ml   Filed Weights   04/03/19 1655 04/05/19 0139  Weight: 75.6 kg 73.6 kg    Examination:  General: No acute distress. Cardiovascular: RRR Lungs: unlabored Abdomen: Soft, nontender, nondistended  Neurological: Alert and oriented 3. Moves all extremities 4. Cranial nerves II through XII grossly intact. Skin: Warm and dry. No rashes or lesions. Extremities: No clubbing or cyanosis. No edema.  Data Reviewed: I have personally reviewed following labs and imaging studies  CBC: Recent Labs  Lab 04/03/19 1800 04/04/19 0120 04/05/19 0409 04/06/19 0408 04/07/19 0634 04/08/19 0535  WBC 5.5 4.7 5.8 4.2 4.6 4.2  NEUTROABS 3.2  --   --   --   --  2.0  HGB 13.0 12.2 11.7* 11.7* 11.4* 11.4*  HCT 39.8 37.9 35.6* 35.9* 35.2* 34.3*  MCV 98.0 99.0 97.5 96.8 97.5 96.6  PLT 293 285 285 275 308 202   Basic Metabolic Panel: Recent Labs  Lab 04/04/19 0120 04/05/19 0409 04/06/19  0408 04/07/19 0634 04/08/19 0535  NA 137 138 138 139 139  K 3.6 4.1 4.1 4.4 4.4  CL 102 101 103 102 101  CO2 '23 23 24 26 29  '$ GLUCOSE 111* 95 106* 108* 105*  BUN 14 23 38* 43* 41*  CREATININE 0.71 0.98 0.90 0.96 1.10*  CALCIUM 9.3 9.2 9.5 9.5 9.5  MG  --   --   --  2.1 2.0   GFR: Estimated Creatinine Clearance: 44.1 mL/min (A) (by C-G formula based on SCr of 1.1 mg/dL (H)). Liver Function Tests: Recent Labs  Lab 04/03/19 1800 04/05/19  0409 04/06/19 0408 04/07/19 0634 04/08/19 0535  AST '17 15 18 16 17  '$ ALT '25 19 18 17 16  '$ ALKPHOS 82 71 69 70 63  BILITOT 0.7 0.7 1.0 0.3 0.6  PROT 6.5 6.1* 6.1* 5.9* 6.0*  ALBUMIN 3.2* 3.0* 2.9* 2.8* 2.9*   Recent Labs  Lab 04/04/19 0240  LIPASE 25   No results for input(s): AMMONIA in the last 168 hours. Coagulation Profile: No results for input(s): INR, PROTIME in the last 168 hours. Cardiac Enzymes: No results for input(s): CKTOTAL, CKMB, CKMBINDEX, TROPONINI in the last 168 hours. BNP (last 3 results) No results for input(s): PROBNP in the last 8760 hours. HbA1C: Recent Labs    04/08/19 0535  HGBA1C 5.8*   CBG: Recent Labs  Lab 04/07/19 1234 04/07/19 1846 04/07/19 2345 04/08/19 0523 04/08/19 1313  GLUCAP 145* 94 101* 99 102*   Lipid Profile: No results for input(s): CHOL, HDL, LDLCALC, TRIG, CHOLHDL, LDLDIRECT in the last 72 hours. Thyroid Function Tests: No results for input(s): TSH, T4TOTAL, FREET4, T3FREE, THYROIDAB in the last 72 hours. Anemia Panel: Recent Labs    04/07/19 0634 04/08/19 0535  FERRITIN 425* 433*   Sepsis Labs: No results for input(s): PROCALCITON, LATICACIDVEN in the last 168 hours.  Recent Results (from the past 240 hour(s))  SARS CORONAVIRUS 2 (TAT 6-24 HRS) Nasopharyngeal Nasopharyngeal Swab     Status: Abnormal   Collection Time: 04/01/19  8:33 AM   Specimen: Nasopharyngeal Swab  Result Value Ref Range Status   SARS Coronavirus 2 POSITIVE (A) NEGATIVE Final    Comment: RESULT CALLED TO, READ BACK BY AND VERIFIED WITH: Precious Reel, ED RN BY EMAIL AT 9417 ON 04/01/19 BY C. JESSUP, MT. (NOTE) SARS-CoV-2 target nucleic acids are DETECTED. The SARS-CoV-2 RNA is generally detectable in upper and lower respiratory specimens during the acute phase of infection. Positive results are indicative of active infection with SARS-CoV-2. Clinical  correlation with patient history and other diagnostic information is necessary to determine  patient infection status. Positive results do  not rule out bacterial infection or co-infection with other viruses. The expected result is Negative. Fact Sheet for Patients: SugarRoll.be Fact Sheet for Healthcare Providers: https://www.woods-mathews.com/ This test is not yet approved or cleared by the [place] FDA and  has been authorized for detection and/or diagnosis of SARS-CoV-2 by FDA under an Emergency Use Authorization (EUA). This EUA will remain  in effect (meaning  this test can be used) for the duration of the COVID-19 declaration under Section 564(b)(1) of the Act, 21 U.S.C. section 360bbb-3(b)(1), unless the authorization is terminated or revoked sooner. Performed at [facility], [place] [address], [place], [place] [address]   Urine culture     Status: None   Collection Time: 04/01/19  8:23 PM   Specimen: Urine, Clean Catch  Result Value Ref Range Status   Specimen Description  Final    URINE, CLEAN CATCH Performed at [facility], [place] [address] [place], [place] [address]    Special Requests   Final    NONE Performed at [facility], [place] [address] [place], [place] [address]    Culture   Final    Multiple bacterial morphotypes present, none predominant. Suggest appropriate recollection if clinically indicated.   Report Status 04/03/2019 FINAL  Final  SARS CORONAVIRUS 2 (TAT 6-24 HRS) Nasopharyngeal Nasopharyngeal Swab     Status: Abnormal   Collection Time: 04/03/19  8:43 PM   Specimen: Nasopharyngeal Swab  Result Value Ref Range Status   SARS Coronavirus 2 POSITIVE (A) NEGATIVE Final    Comment: RESULT CALLED TO, READ BACK BY AND VERIFIED WITH: RN Alesia Banda AT 8346 04/04/2019 BY L BENFIELD (NOTE) SARS-CoV-2 target nucleic acids are DETECTED. The SARS-CoV-2 RNA is generally detectable in upper and lower respiratory specimens during the acute phase of infection. Positive  results are indicative of active infection with SARS-CoV-2. Clinical  correlation with patient history and other diagnostic information is necessary to determine patient infection status. Positive results do  not rule out bacterial infection or co-infection with other viruses. The expected result is Negative. Fact Sheet for Patients: SugarRoll.be Fact Sheet for Healthcare Providers: https://www.woods-mathews.com/ This test is not yet approved or cleared by the [place] FDA and  has been authorized for detection and/or diagnosis of SARS-CoV-2 by FDA under an Emergency Use Authorization (EUA). This EUA will remain  in effect (meaning this test can be [place] ed) for the duration of the COVID-19 declaration under Section 564(b)(1) of the Act, 21 U.S.C. section 360bbb-3(b)(1), unless the authorization is terminated or revoked sooner. Performed at [facility], [place] [address], [place], [place] [address]          Radiology Studies: No results found.      Scheduled Meds: . ARIPiprazole  2 mg Oral QHS  . DULoxetine  20 mg Oral Daily  . feeding supplement (ENSURE ENLIVE)  237 mL Oral BID BM  . levothyroxine  100 mcg Oral Q0600  . multivitamin with minerals  1 tablet Oral Daily  . polyethylene glycol  17 g Oral BID   Continuous Infusions:   LOS: 5 days    Time spent: over 30 min    Fayrene Helper, MD [facility] Pager AMION  If 7PM-7AM, please contact night-coverage [email or web address] Password [facility] 04/09/2019, 5:05 PM

## 2019-04-09 NOTE — Progress Notes (Signed)
Pt's husband Timmothy Sours called this RN and is requesting the MD's not tell his wife that she may be being discharged to a SNF. Husband states, " She just called me and lost it saying that the doctor told her she may be going to rehab." Husband states, " Don't tell her where she is going, just take her." Pt's husband wants a note to be left for the doctors and to make sure "we don't tell her".   Will continue to assess this situation. Social work consulted.  Lucius Conn, RN

## 2019-04-09 NOTE — TOC Initial Note (Signed)
Transition of Care [facility]) - Initial/Assessment Note    Patient Details  Name: Debra Morgan MRN: [id number] Date of Birth: 1946-04-20  Transition of Care [facility]) CM/SW Contact:    Mearl Latin, LCSW Phone Number: 04/09/2019, 9:22 AM  Clinical Narrative:                 CSW received consult regarding spouse request for SNF placement. CSW spoke with patient's spouse. He reported that he had patient a bed at [facility] but patient refused to go at the last minute. He repots her confusion has increased and he feels that she is in a better place to accept rehab. He is again requesting [facility] or [facility]. CSW explained that due to patient's COVID positive status, [facility] would hopefully be a good option pending bed availability and insurance approval. CSW provided Medicare ratings list. PT please update recommendations to reflect SNF needs for insurance purposes.   Expected Discharge Plan: Skilled Nursing Facility Barriers to Discharge: Continued Medical Work up   Patient Goals and CMS Choice Patient states their goals for this hospitalization and ongoing recovery are:: Spouse hoping for rehab placement CMS Medicare.gov Compare Post Acute Care list provided to:: Patient Represenative (must comment)(Spouse) Choice offered to / list presented to : Spouse  Expected Discharge Plan and Services Expected Discharge Plan: Skilled Nursing Facility In-house Referral: Clinical Social Work   Post Acute Care Choice: Skilled Nursing Facility Living arrangements for the past 2 months: Single Family Home                 DME Arranged: N/A         HH Arranged: NA          Prior Living Arrangements/Services Living arrangements for the past 2 months: Single Family Home Lives with:: Spouse Patient language and need for interpreter reviewed:: Yes Do you feel safe going back to the place where you live?: Yes      Need for Family Participation in Patient Care: Yes (Comment) Care giver  support system in place?: Yes (comment) Current home services: DME, Home OT, Home PT, Homehealth aide Criminal Activity/Legal Involvement Pertinent to Current Situation/Hospitalization: No - Comment as needed  Activities of Daily Living Home Assistive Devices/Equipment: Wheelchair ADL Screening (condition at time of admission) Patient's cognitive ability adequate to safely complete daily activities?: Yes Is the patient deaf or have difficulty hearing?: No Does the patient have difficulty seeing, even when wearing glasses/contacts?: No Does the patient have difficulty concentrating, remembering, or making decisions?: Yes Patient able to express need for assistance with ADLs?: Yes Does the patient have difficulty dressing or bathing?: Yes Independently performs ADLs?: No Does the patient have difficulty walking or climbing stairs?: Yes Weakness of Legs: Both Weakness of Arms/Hands: Both  Permission Sought/Granted Permission sought to share information with : Facility Medical sales representative, Family Supports Permission granted to share information with : Yes, Verbal Permission Granted  Share Information with NAME: Roe Coombs  Permission granted to share info w AGENCY: SNFs  Permission granted to share info w Relationship: Spouse  Permission granted to share info w Contact Information: [phone]  Emotional Assessment Appearance:: Appears stated age Attitude/Demeanor/Rapport: Complaining Affect (typically observed): Appropriate Orientation: : Oriented to Self, Oriented to Place, Oriented to  Time, Oriented to Situation(Periods of confusion) Alcohol / Substance Use: Not Applicable Psych Involvement: Yes (comment)  Admission diagnosis:  Delirium [R41.0] Abdominal pain [R10.9] Encephalopathy acute [G93.40] Patient Active Problem List   Diagnosis Date Noted  . Visual  hallucinations 04/05/2019  . Confusion 04/04/2019  . Encephalopathy acute 04/04/2019  . Generalized pain 04/04/2019  .  Right leg weakness 04/04/2019  . Abdominal pain 04/04/2019  . Bradycardia 04/04/2019  . Acute encephalopathy 04/03/2019  . Hypothyroidism 04/03/2019  . Major depressive disorder, recurrent episode, moderate ([place]) 03/21/2019  . Generalized weakness 03/16/2019  . Thyroid disease   . Dystonia   . Pneumonia due to COVID-19 virus   . Elevated transaminase level   . Hypertension   . Chest pain   . COVID-19 virus infection    PCP:  Karleen Hampshire., MD Pharmacy:   [facility], [place] - [address] [place] [address] Phone: [phone] Fax: [phone]     Social Determinants of Health (SDOH) Interventions    Readmission Risk Interventions No flowsheet data found.

## 2019-04-09 NOTE — [place] FL2 (Signed)
[place] MEDICAID FL2 LEVEL OF CARE SCREENING TOOL     IDENTIFICATION  Patient Name: Debra Morgan Birthdate: 07/22/1945 Sex: female Admission Date (Current Location): 04/03/2019  [facility] and [place] Number:  Producer, television/film/video and Address:  [facility] [place], [address] [place], [place] [address]      Provider Number: [id number]  Attending Physician Name and Address:  Zigmund Daniel., *  Relative Name and Phone Number:  Roe Coombs, spouse, [phone]    Current Level of Care: Hospital Recommended Level of Care: Skilled Nursing Facility Prior Approval Number:    Date Approved/Denied:   PASRR Number: [id number]  Discharge Plan: SNF    Current Diagnoses: Patient Active Problem List   Diagnosis Date Noted  . Visual hallucinations 04/05/2019  . Confusion 04/04/2019  . Encephalopathy acute 04/04/2019  . Generalized pain 04/04/2019  . Right leg weakness 04/04/2019  . Abdominal pain 04/04/2019  . Bradycardia 04/04/2019  . Acute encephalopathy 04/03/2019  . Hypothyroidism 04/03/2019  . Major depressive disorder, recurrent episode, moderate ([facility]) 03/21/2019  . Generalized weakness 03/16/2019  . Thyroid disease   . Dystonia   . Pneumonia due to COVID-19 virus   . Elevated transaminase level   . Hypertension   . Chest pain   . COVID-19 virus infection     Orientation RESPIRATION BLADDER Height & Weight     Self, Time, Situation, Place  Normal Continent, External catheter Weight: 162 lb 4.1 oz (73.6 kg) Height:  5\' 4"  (162.6 cm)  BEHAVIORAL SYMPTOMS/MOOD NEUROLOGICAL BOWEL NUTRITION STATUS      Continent Diet(Please see DC Summary)  AMBULATORY STATUS COMMUNICATION OF NEEDS Skin   Limited Assist Verbally Normal                       Personal Care Assistance Level of Assistance  Bathing, Feeding, Dressing Bathing Assistance: Limited assistance Feeding assistance: Limited assistance Dressing Assistance: Limited assistance      Functional Limitations Info  Sight, Hearing, Speech Sight Info: Adequate Hearing Info: Adequate Speech Info: Adequate    SPECIAL CARE FACTORS FREQUENCY  PT (By licensed PT), OT (By licensed OT)     PT Frequency: 5x/week OT Frequency: 3x/week            Contractures Contractures Info: Not present    Additional Factors Info  Code Status, Allergies, Isolation Precautions Code Status Info: Partial Allergies Info: Sinemet (Carbidopa W-levodopa), Codeine, Tetracyclines & Related     Isolation Precautions Info: COVID positive beginning on 03/04/19, but per ID recommendations, precautions discontinued     Current Medications (04/09/2019):  This is the current hospital active medication list Current Facility-Administered Medications  Medication Dose Route Frequency Provider Last Rate Last Dose  . acetaminophen (TYLENOL) tablet 650 mg  650 mg Oral Q6H PRN 04/11/2019, MD   650 mg at 04/09/19 04/11/19   Or  . acetaminophen (TYLENOL) suppository 650 mg  650 mg Rectal Q6H PRN 8413, MD      . ARIPiprazole (ABILIFY) tablet 2 mg  2 mg Oral QHS Eduard Clos, MD   2 mg at 04/08/19 2221  . DULoxetine (CYMBALTA) DR capsule 20 mg  20 mg Oral Daily 2222, MD   20 mg at 04/08/19 0848  . feeding supplement (ENSURE ENLIVE) (ENSURE ENLIVE) liquid 237 mL  237 mL Oral BID BM 04/10/19 D, MD   237 mL at 04/08/19 1459  . ketorolac (TORADOL) 15 MG/ML injection  15 mg  15 mg Intravenous BID Oretha Milch D, MD   15 mg at 04/08/19 2221  . labetalol (NORMODYNE) injection 10 mg  10 mg Intravenous Q2H PRN Rise Patience, MD      . levothyroxine (SYNTHROID) tablet 100 mcg  100 mcg Oral Q0600 Oretha Milch D, MD   100 mcg at 04/09/19 0519  . morphine 2 MG/ML injection 0.5 mg  0.5 mg Intravenous Q3H PRN Oretha Milch D, MD      . multivitamin with minerals tablet 1 tablet  1 tablet Oral Daily Oretha Milch D, MD   1 tablet at 04/08/19 0841  . ondansetron  (ZOFRAN) tablet 4 mg  4 mg Oral Q6H PRN Rise Patience, MD       Or  . ondansetron [facility]) injection 4 mg  4 mg Intravenous Q6H PRN Rise Patience, MD      . traMADol Veatrice Bourbon) tablet 50 mg  50 mg Oral Q6H PRN Oretha Milch D, MD   50 mg at 04/08/19 1316     Discharge Medications: Please see discharge summary for a list of discharge medications.  Relevant Imaging Results:  Relevant Lab Results:   Additional Information SSN: [id number]  Benard Halsted, LCSW

## 2019-04-10 MED ORDER — TRAMADOL HCL 50 MG PO TABS
50.0000 mg | ORAL_TABLET | Freq: Two times a day (BID) | ORAL | Status: DC | PRN
  Administered 2019-04-10: 23:00:00 50 mg via ORAL
  Filled 2019-04-10: qty 1

## 2019-04-10 MED ORDER — CARVEDILOL 6.25 MG PO TABS
6.2500 mg | ORAL_TABLET | Freq: Two times a day (BID) | ORAL | Status: DC
  Administered 2019-04-11 – 2019-04-12 (×3): 6.25 mg via ORAL
  Filled 2019-04-10 (×3): qty 1

## 2019-04-10 NOTE — Progress Notes (Addendum)
PROGRESS NOTE    Rajanae Mantia  HMC:[id number] DOB: 13-Feb-1946 DOA: 04/03/2019 PCP: Karleen Hampshire., MD   Brief Narrative:   Debra Morgan is Debra Morgan 73 y.o. year old female with medical history significant for HTN, HLD, hypothyroidism, movement disorder/dystonia, wheelchair-bound, depression, tested positive for Covid on 10/16 (in [place]), hospitalized at [facility] 10/22-10/26 and treated with Decadron for COVID-19 pneumonia without hypoxia, admitted at home 11/7 for generalized weakness, decreased mobility and transfers in the setting of chronic dystonia, peripheral neuropathy complicated by metabolic encephalopathy.  At that time decline in her physical functional status thought to be Debra Morgan consequence of her recent COVID-19 viral infection: Negative work-up for acute CVA including head CT, CTA.  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  BuSpar was discontinued.    She now presents with worsening confusion, agitation, answering questions inappropriately and not being oriented on 04/03/2019.  Assessment & Plan:   Principal Problem:   Acute encephalopathy Active Problems:   Dystonia   Hypertension   COVID-19 virus infection   Hypothyroidism   Confusion   Encephalopathy acute   Generalized pain   Right leg weakness   Abdominal pain   Bradycardia   Visual hallucinations  Waxing and waning confusion, suspect combination of Covid encephalopathy with hospital-acquired delirium and somatization.   - Workup at this point without notable abnormalities - MRI without acute abnormality - EEG with nonspecific cortical dysfunction in L temporal region.  No seizures or epileptiform discharges were seen throughout the recording.  - her ESR was elevated (90), CRP negative    - urine cx with multiple bacterial morphotypes, none predominant - she's been afebrile - some concern for malingering by previous providers as well - Neurology has seen patient and is concerned for  COVID encephalopathy (which can be prolonged) - now signed off - will have pt continue with PT/OT and speech therapy - per speech, pt consistently hesitated to start task and then consistently finished tasks correctly - with PT and OT she was able to do more than she thought as well. - delirium precautions - psych has been consulted - no need for inpatient psych and recommend outpatient psych - they note malingering possible (see note) (though based on conversation with family, doesn't sound c/w this).  I've asked psychiatry to see her again today with concern for some degree of somatization -> recommended cymbalta and abilify.  Will continue to monitor.  - She's improved at this time -> plan for discharge when able - family requesting SNF placement - Family requested transfer for second opinion.  I called [facility] and [facility] who both declined transfer.  Persistent right lower extremity pain, seems to be chronic.   Has diminished trunk and right foot which is also chronic limited dystonia.  Lumbar x-ray shows chronic compression fracture without any evidence of acute pathology. -Continue scheduled IV Toradol for pain control -PT to evaluate--recommends 24 H assist/HHPT  Generalized abdominal pain, slightly improved.   AST ALT, T bili all unremarkable, mild nausea but tolerating oral diet..  Mild nausea without vomiting.  Tolerating oral intake.  Abdominal x-ray negative -Optimize bowel regimen, close monitor, antiemetics as needed.    Dyspnea with minimal exertion.   Patient is not hypoxic, chest x-ray shows improvement in residual opacities.  Do not suspect new COVID 19 infection.  Patient not very active, doubt PE, D-dimer unremarkable venous duplex on 11/1 - for DVT.  BMP unremarkable -Unsure the need for ambulatory O2 saturations given  patient is wheelchair-bound  Recent COVID-19 infection (10/19) with pneumonia.   No viral symptoms.  On room air.  Chest imaging shows improvement in bilateral  airspace opacities.  Doubt new infection.  Repeat was obtained in ED, D-dimer unremarkable doubt PE -Airborne/contact precautions -discussed case with Dr. Baxter Flattery, as patient tested positive on 10/16, can discontinue precautions.  Hypothyroidism.   TSH 4.7 on admission. -Increase home Synthroid from 75 to 100 mcg, encourage taking first thing in the morning without other medications -Will need close outpatient follow-up with PCP for repeat TSH  Bradycardia, stable Coreg was discontinued on previous hospitalization.  Worsen night -Monitor on telemetry  Chronic movement disorder/dystonia.  Chronically wheelchair-bound.  Per neuro note from [facility], 4-5 yr hx of progressive freezing of gait most prominent in R leg - following with neurology outpatient. -Continue Cogentin  HTN, stable Some mild relative hypotension with SBP's in the 100s on admission.  Currently normotensive. 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   Lexapro 10 mg daily ordered.  Reporting visual hallucinations, could be related to infectious encephalopathy related to Covid infection, given psych history also concern may need some adjustments in medications -Now on abilify and cymbalta per psych as noted above -Psych c/s as above  Involuntary Twitches: pt concerned about twitches. Will continue to monitor, none observed.    DVT prophylaxis: SCD Code Status:full  Family Communication: husband on 11/20 - he's interested in SNF placement - will consult social work.  Called Mr. Keepers, no answer, left message 11/22.  11/22, daughter, dad. Disposition Plan: pending further improvement   Consultants:   neurology  Procedures:  EEG IMPRESSION: This study is suggestive  of nonspecific cortical dysfunction in left temporal region. No seizures or epileptiform discharges were seen throughout the recording.   Antimicrobials:  Anti-infectives (From admission, onward)   None     Subjective: Dollie Bressi&Ox4 C/o twitching earlier in day  Objective: Vitals:   04/09/19 1558 04/09/19 2214 04/10/19 0444 04/10/19 1213  BP: 123/62 120/60 129/69 136/88  Pulse: 63 77 69 74  Resp: _0 Temp: (!) 97.5 F (36.4 C) 97.9 F (36.6 C) 97.8 F (36.6 C) 98.1 F (36.7 C)  TempSrc: Oral Oral Oral Oral  SpO2: 97% 94% 93% 95%  Weight:      Height:        Intake/Output Summary (Last 24 hours) at 04/10/2019 1619 Last data filed at 04/10/2019 1306 Gross per 24 hour  Intake 480 ml  Output 500 ml  Net -20 ml   Filed Weights   04/03/19 1655 04/05/19 0139  Weight: 75.6 kg 73.6 kg    Examination:  General: No acute distress. Cardiovascular: RRR Lungs: unlabored Abdomen: Soft, nontender, nondistended Neurological: Alert and oriented 3. Moves all extremities 4. Cranial nerves II through XII grossly intact. Skin: Warm and dry. No rashes or lesions. Extremities: No clubbing or cyanosis. No edema.   Data Reviewed: I have personally reviewed following labs and imaging studies  CBC: Recent Labs  Lab 04/03/19 1800 04/04/19 0120 04/05/19 0409 04/06/19 0408 04/07/19 0634 04/08/19 0535  WBC 5.5 4.7 5.8 4.2 4.6 4.2  NEUTROABS 3.2  --   --   --   --  2.0  HGB 13.0 12.2 11.7* 11.7* 11.4* 11.4*  HCT 39.8 37.9 35.6* 35.9* 35.2* 34.3*  MCV 98.0 99.0 97.5 96.8 97.5 96.6  PLT 293 285 285 275 308 536   Basic Metabolic Panel: Recent Labs  Lab 04/04/19 0120 04/05/19 0409 04/06/19 0408 04/07/19 0634 04/08/19 0535  NA 137 138 138 139 139  K 3.6 4.1 4.1 4.4 4.4  CL 102 101 103 102 101  CO2 _0 GLUCOSE 111* 95 106* 108* 105*  BUN 14 23 38* 43* 41*  CREATININE 0.71 0.98 0.90 0.96 1.10*  CALCIUM 9.3 9.2 9.5 9.5 9.5  MG  --   --   --  2.1 2.0   GFR:  Estimated Creatinine Clearance: 44.1 mL/min ([place] And[place]) (by C-G formula based on SCr of 1.1 mg/dL (H)). Liver Function Tests: Recent Labs  Lab 04/03/19 1800 04/05/19 0409 04/06/19 0408 04/07/19 0634 04/08/19 0535  AST _1 ALT _2 ALKPHOS 82 71 69 70 63  BILITOT 0.7 0.7 1.0 0.3 0.6  PROT 6.5 6.1* 6.1* 5.9* 6.0*  ALBUMIN 3.2* 3.0* 2.9* 2.8* 2.9*   Recent Labs  Lab 04/04/19 0240  LIPASE 25   No results for input(s): AMMONIA in the last 168 hours. Coagulation Profile: No results for input(s): INR, PROTIME in the last 168 hours. Cardiac Enzymes: No results for input(s): CKTOTAL, CKMB, CKMBINDEX, TROPONINI in the last 168 hours. BNP (last 3 results) No results for input(s): PROBNP in the last 8760 hours. HbA1C: Recent Labs    04/08/19 0535  HGBA1C 5.8*   CBG: Recent Labs  Lab 04/07/19 1234 04/07/19 1846 04/07/19 2345 04/08/19 0523 04/08/19 1313  GLUCAP 145* 94 101* 99 102*   Lipid Profile: No results for input(s): CHOL, HDL, LDLCALC, TRIG, CHOLHDL, LDLDIRECT in the last 72 hours. Thyroid Function Tests: No results for input(s): TSH, T4TOTAL, FREET4, T3FREE, THYROIDAB in the last 72 hours. Anemia Panel: Recent Labs    04/08/19 0535  FERRITIN 433*   Sepsis Labs: No results for input(s): PROCALCITON, LATICACIDVEN in the last 168 hours.  Recent Results (from the past 240 hour(s))  SARS CORONAVIRUS 2 (TAT 6-24 HRS) Nasopharyngeal Nasopharyngeal Swab     Status: Abnormal   Collection Time: 04/01/19  8:33 AM   Specimen: Nasopharyngeal Swab  Result Value Ref Range Status   SARS Coronavirus 2 POSITIVE (Shallyn Constancio) NEGATIVE Final    Comment: RESULT CALLED TO, READ BACK BY AND VERIFIED WITH: Precious Reel, ED RN BY EMAIL AT 6440 ON 04/01/19 BY C. JESSUP, MT. (NOTE) SARS-CoV-2 target nucleic acids are DETECTED. The SARS-CoV-2 RNA is generally detectable in upper and lower respiratory specimens during the acute phase of infection. Positive results are indicative  of active infection with SARS-CoV-2. Clinical  correlation with patient history and other diagnostic information is necessary to determine patient infection status. Positive results do  not rule out bacterial infection or co-infection with other viruses. The expected result is Negative. Fact Sheet for Patients: SugarRoll.be Fact Sheet for Healthcare Providers: https://www.woods-mathews.com/ This test is not yet approved or cleared by the [place] FDA and  has been authorized for detection and/or diagnosis of SARS-CoV-2 by FDA under an Emergency Use Authorization (EUA). This EUA will remain  in effect (meaning  this test can be used) for the duration of the COVID-19 declaration under Section 564(b)(1) of the Act, 21 U.S.C. section 360bbb-3(b)(1), unless the authorization is terminated or revoked sooner. Performed at [facility], [place] [address] [place], [place] [address]   Urine culture     Status: None  Collection Time: 04/01/19  8:23 PM   Specimen: Urine, Clean Catch  Result Value Ref Range Status   Specimen Description   Final    URINE, CLEAN CATCH Performed at [facility], [place] [address] [place], [place] [address]    Special Requests   Final    NONE Performed at [facility], [place] [address] [place], [place] [address]    Culture   Final    Multiple bacterial morphotypes present, none predominant. Suggest appropriate recollection if clinically indicated.   Report Status 04/03/2019 FINAL  Final  SARS CORONAVIRUS 2 (TAT 6-24 HRS) Nasopharyngeal Nasopharyngeal Swab     Status: Abnormal   Collection Time: 04/03/19  8:43 PM   Specimen: Nasopharyngeal Swab  Result Value Ref Range Status   SARS Coronavirus 2 POSITIVE (Kyrus Hyde) NEGATIVE Final    Comment: RESULT CALLED TO, READ BACK BY AND VERIFIED WITH: RN Alesia Banda AT 0370 04/04/2019 BY L BENFIELD (NOTE) SARS-CoV-2 target nucleic acids are  DETECTED. The SARS-CoV-2 RNA is generally detectable in upper and lower respiratory specimens during the acute phase of infection. Positive results are indicative of active infection with SARS-CoV-2. Clinical  correlation with patient history and other diagnostic information is necessary to determine patient infection status. Positive results do  not rule out bacterial infection or co-infection with other viruses. The expected result is Negative. Fact Sheet for Patients: SugarRoll.be Fact Sheet for Healthcare Providers: https://www.woods-mathews.com/ This test is not yet approved or cleared by the [place] FDA and  has been authorized for detection and/or diagnosis of SARS-CoV-2 by FDA under an Emergency Use Authorization (EUA). This EUA will remain  in effect (meaning this test can be [place] ed) for the duration of the COVID-19 declaration under Section 564(b)(1) of the Act, 21 U.S.C. section 360bbb-3(b)(1), unless the authorization is terminated or revoked sooner. Performed at [facility], [place] [address], [place], [place] [address]          Radiology Studies: No results found.      Scheduled Meds: . ARIPiprazole  2 mg Oral QHS  . DULoxetine  20 mg Oral Daily  . feeding supplement (ENSURE ENLIVE)  237 mL Oral BID BM  . levothyroxine  100 mcg Oral Q0600  . multivitamin with minerals  1 tablet Oral Daily  . polyethylene glycol  17 g Oral BID   Continuous Infusions:   LOS: 6 days    Time spent: over 30 min    Fayrene Helper, MD [facility] Pager AMION  If 7PM-7AM, please contact night-coverage [email or web address] Password [facility] 04/10/2019, 4:19 PM

## 2019-04-10 NOTE — Progress Notes (Signed)
Patient c/o involuntary twitches. States she's been having it for the past day. Patient wonders if it's related to the Abilify she takes at bedside. Dr. Florene Glen paged to make aware.   Hiram Comber, RN 04/10/2019 10:40 AM

## 2019-04-11 LAB — COMPREHENSIVE METABOLIC PANEL
ALT: 24 U/L (ref 0–44)
AST: 25 U/L (ref 15–41)
Albumin: 2.8 g/dL — ABNORMAL LOW (ref 3.5–5.0)
Alkaline Phosphatase: 65 U/L (ref 38–126)
Anion gap: 6 (ref 5–15)
BUN: 30 mg/dL — ABNORMAL HIGH (ref 8–23)
CO2: 28 mmol/L (ref 22–32)
Calcium: 9.1 mg/dL (ref 8.9–10.3)
Chloride: 105 mmol/L (ref 98–111)
Creatinine, Ser: 1.01 mg/dL — ABNORMAL HIGH (ref 0.44–1.00)
GFR calc Af Amer: >60 mL/min (ref >60)
GFR calc non Af Amer: 55 mL/min — ABNORMAL LOW (ref >60)
Glucose, Bld: 102 mg/dL — ABNORMAL HIGH (ref 70–99)
Potassium: 4.1 mmol/L (ref 3.5–5.1)
Sodium: 139 mmol/L (ref 135–145)
Total Bilirubin: 0.5 mg/dL (ref 0.3–1.2)
Total Protein: 5.8 g/dL — ABNORMAL LOW (ref 6.5–8.1)

## 2019-04-11 LAB — CBC
HCT: 32.6 % — ABNORMAL LOW (ref 36.0–46.0)
Hemoglobin: 10.6 g/dL — ABNORMAL LOW (ref 12.0–15.0)
MCH: 31.7 pg (ref 26.0–34.0)
MCHC: 32.5 g/dL (ref 30.0–36.0)
MCV: 97.6 fL (ref 80.0–100.0)
Platelets: 331 10*3/uL (ref 150–400)
RBC: 3.34 MIL/uL — ABNORMAL LOW (ref 3.87–5.11)
RDW: 13.2 % (ref 11.5–15.5)
WBC: 5.3 10*3/uL (ref 4.0–10.5)
nRBC: 0 % (ref 0.0–0.2)

## 2019-04-11 LAB — MAGNESIUM: Magnesium: 2 mg/dL (ref 1.7–2.4)

## 2019-04-11 MED ORDER — BENZTROPINE MESYLATE 1 MG PO TABS
1.0000 mg | ORAL_TABLET | Freq: Two times a day (BID) | ORAL | Status: DC
  Administered 2019-04-11 – 2019-04-12 (×3): 1 mg via ORAL
  Filled 2019-04-11 (×3): qty 1

## 2019-04-11 NOTE — TOC Progression Note (Signed)
Transition of Care [facility]) - Progression Note    Patient Details  Name: Debra Morgan MRN: [id number] Date of Birth: 05/15/1946  [facility]) CM/SW Springer, LCSW Phone Number: 04/11/2019, 1:13 PM  Clinical Narrative:    CSW returned patient's spouse's call. He is still requesting SNF placement. CSW explained that patient will be required to agree to go to SNF since she is oriented. He is still requesting [facility]. CSW explained that [facility] is unable to accept patient (due to patient behaviors). CSW is awaiting availability from [facility] since patient's spouse is hoping for somewhere local that can accept COVID positive patients. Patient will require insurance approval.    Expected Discharge Plan: Skilled Nursing Facility Barriers to Discharge: Continued Medical Work up  Expected Discharge Plan and Services Expected Discharge Plan: [place] In-house Referral: Clinical Social Work   Post Acute Care Choice: [place] Living arrangements for the past 2 months: Single Family Home                 DME Arranged: N/A         HH Arranged: NA           Social Determinants of Health (SDOH) Interventions    Readmission Risk Interventions No flowsheet data found.

## 2019-04-11 NOTE — Progress Notes (Addendum)
Patient ID: Debra Morgan, female   DOB: 22-Sep-1945, 73 y.o.   MRN: [id number]  PROGRESS NOTE    Debra Morgan  BMW:[id number] DOB: Dec 19, 1945 DOA: 04/03/2019 PCP: Karleen Hampshire., MD   Brief Narrative:  73 year old female with history of hypertension, hyperlipidemia, hypothyroidism, movement disorder/dystonia, wheelchair-bound, depression, tested positive for Covid on 03/04/2019 (in [place]), hospitalized at [facility] from 03/10/2019-03/14/2019 and treated with Decadron for COVID-19 pneumonia without hypoxia, admitted to our hospital on 03/16/2019 for generalized weakness and subsequently transferred to [facility] and subsequently had change in mental status for which her work-up for CVA was negative and was subsequently discharged home on 03/26/2019.  At that time, decline in her physical functional status was thought to be a consequence of her recent COVID-19 viral infection; psychiatry had also evaluated the patient and BuSpar was discontinued and she was started on Lexapro.  She presented again on 04/03/2019 for worsening confusion, agitation.  Neurology and psychiatry were consulted.  Assessment & Plan:   Waxing and waning confusion, suspect combination of Covid encephalopathy with hospital-acquired delirium and somatization -Work-up at this point without notable abnormalities.  MRI without acute abnormality -EEG with nonspecific cortical dysfunction in the left temporal region.  No seizures or epileptiform discharges were seen throughout the recording -ESR elevated at 90; CRP negative -Urine culture with multiple bacterial morphotypes, none predominant.  Patient has been afebrile -Some concern for malingering by previous providers as well -Neurology has seen the patient and was concerned for Covid encephalopathy, which can be prolonged; neurology has subsequently signed off.  Outpatient follow-up with neurology -Delirium precautions -Psychiatry saw her again on 04/07/2019: Lexapro was stopped.  She  was started on Cymbalta and Abilify.  Outpatient follow-up with psychiatry.  No need for inpatient psychiatric hospitalization. -Family requesting SNF placement.  Will request PT/OT evaluation. -Family had also requested transfer for second opinion.  Prior hospitalist had called [facility], who both declined transfer  Persistent right lower extremity pain, seems to be chronic -Lumbar x-ray showed chronic compression fractures without any evidence of acute pathology -Continue PT eval.  Continue pain management  Generalized abdominal pain, improving -Liver function unremarkable.  Abdominal x-ray negative.  Tolerating oral intake.  Recent COVID-19 infection in October 2020 with pneumonia -Currently no pulmonary symptoms.   -Case was discussed with Dr. Luvenia Heller by prior provider: As initial test for Covid was positive on 03/04/2019, Covid isolation can be discontinued.  Subsequently isolation has been discontinued.  Hypothyroidism -Continue Synthroid.  Outpatient follow-up  Bradycardia  -Heart rate stable.  Continue Coreg.  Coreg had to be discontinued during last hospitalization.  Hypertension -Blood pressure on the lower side.  Continue Coreg.  Home candesartan and Maxide are on hold.  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  Per neuro note from 2, 4 to 5-year history of progressive freezing of gait most prominent in the right leg -Outpatient follow-up with neurology -Restart Cogentin.  Patient was on Cogentin during last hospitalization.  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  Outpatient follow-up with psychiatry.  Generalized deconditioning -Patient is currently listed as partial code.  Will request palliative care evaluation for goals of care discussion.   DVT prophylaxis: SCDs.  Start Lovenox Code Status: Full Family Communication:  Spoke to husband/Don on phone on 04/11/2019 Disposition Plan: Probable SNF placement.  Family interested in SNF placement for patient.  Consultants: Neurology  Procedures:  EEG IMPRESSION: This study issuggestive of nonspecific cortical  dysfunction in left temporal region.No seizures or epileptiform discharges were seen throughout the recording.  Antimicrobials: None  Subjective: Patient seen and examined at bedside.  She states that she feels confused and still is having abnormal lower extremity movements.  No overnight fever, vomiting, worsening abdominal pain reported.  Objective: Vitals:   04/10/19 0444 04/10/19 1213 04/10/19 2142 04/11/19 0557  BP: 129/69 136/88 (!) 118/58 140/69  Pulse: 69 74 69 68  Resp: 20 16 16 16   Temp: 97.8 F (36.6 C) 98.1 F (36.7 C) (!) 97.4 F (36.3 C) 98.2 F (36.8 C)  TempSrc: Oral Oral Oral Oral  SpO2: 93% 95% 92% 92%  Weight:    74.5 kg  Height:        Intake/Output Summary (Last 24 hours) at 04/11/2019 1106 Last data filed at 04/11/2019 0144 Gross per 24 hour  Intake 240 ml  Output 800 ml  Net -560 ml   Filed Weights   04/03/19 1655 04/05/19 0139 04/11/19 0557  Weight: 75.6 kg 73.6 kg 74.5 kg    Examination:  General exam: Appears calm and comfortable.  Poor historian.  Answers questions appropriately but states that I feel confused. Respiratory system: Bilateral decreased breath sounds at bases Cardiovascular system: S1 & S2 heard, Rate controlled Gastrointestinal system: Abdomen is nondistended, soft and nontender. Normal bowel sounds heard. Extremities: No cyanosis, clubbing, edema    Data Reviewed: I have personally reviewed following labs and imaging studies  CBC: Recent Labs  Lab 04/05/19 0409 04/06/19 0408 04/07/19 0634 04/08/19 0535 04/11/19 0224  WBC 5.8 4.2 4.6 4.2 5.3  NEUTROABS  --   --   --  2.0  --   HGB 11.7* 11.7* 11.4* 11.4* 10.6*  HCT 35.6* 35.9* 35.2* 34.3* 32.6*  MCV 97.5 96.8 97.5 96.6 97.6   PLT 285 275 308 321 552   Basic Metabolic Panel: Recent Labs  Lab 04/05/19 0409 04/06/19 0408 04/07/19 0634 04/08/19 0535 04/11/19 0224  NA 138 138 139 139 139  K 4.1 4.1 4.4 4.4 4.1  CL 101 103 102 101 105  CO2 23 24 26 29 28   GLUCOSE 95 106* 108* 105* 102*  BUN 23 38* 43* 41* 30*  CREATININE 0.98 0.90 0.96 1.10* 1.01*  CALCIUM 9.2 9.5 9.5 9.5 9.1  MG  --   --  2.1 2.0 2.0   GFR: Estimated Creatinine Clearance: 48.3 mL/min (A) (by C-G formula based on SCr of 1.01 mg/dL (H)). Liver Function Tests: Recent Labs  Lab 04/05/19 0409 04/06/19 0408 04/07/19 0634 04/08/19 0535 04/11/19 0224  AST 15 18 16 17 25   ALT 19 18 17 16 24   ALKPHOS 71 69 70 63 65  BILITOT 0.7 1.0 0.3 0.6 0.5  PROT 6.1* 6.1* 5.9* 6.0* 5.8*  ALBUMIN 3.0* 2.9* 2.8* 2.9* 2.8*   No results for input(s): LIPASE, AMYLASE in the last 168 hours. No results for input(s): AMMONIA in the last 168 hours. Coagulation Profile: No results for input(s): INR, PROTIME in the last 168 hours. Cardiac Enzymes: No results for input(s): CKTOTAL, CKMB, CKMBINDEX, TROPONINI in the last 168 hours. BNP (last 3 results) No results for input(s): PROBNP in the last 8760 hours. HbA1C: No results for input(s): HGBA1C in the last 72 hours. CBG: Recent Labs  Lab 04/07/19 1234 04/07/19 1846 04/07/19 2345 04/08/19 0523 04/08/19 1313  GLUCAP 145* 94 101* 99 102*   Lipid Profile: No results for input(s): CHOL, HDL, LDLCALC, TRIG, CHOLHDL, LDLDIRECT in the last 72 hours. Thyroid Function Tests:  No results for input(s): TSH, T4TOTAL, FREET4, T3FREE, THYROIDAB in the last 72 hours. Anemia Panel: No results for input(s): VITAMINB12, FOLATE, FERRITIN, TIBC, IRON, RETICCTPCT in the last 72 hours. Sepsis Labs: No results for input(s): PROCALCITON, LATICACIDVEN in the last 168 hours.  Recent Results (from the past 240 hour(s))  Urine culture     Status: None   Collection Time: 04/01/19  8:23 PM   Specimen: Urine, Clean Catch   Result Value Ref Range Status   Specimen Description   Final    URINE, CLEAN CATCH Performed at [facility], [place] [address] [place], [place] [address]    Special Requests   Final    NONE Performed at [facility], [place] [address], [place], [place] [address]    Culture   Final    Multiple bacterial morphotypes present, none predominant. Suggest appropriate recollection if clinically indicated.   Report Status 04/03/2019 FINAL  Final  SARS CORONAVIRUS 2 (TAT 6-24 HRS) Nasopharyngeal Nasopharyngeal Swab     Status: Abnormal   Collection Time: 04/03/19  8:43 PM   Specimen: Nasopharyngeal Swab  Result Value Ref Range Status   SARS Coronavirus 2 POSITIVE (A) NEGATIVE Final    Comment: RESULT CALLED TO, READ BACK BY AND VERIFIED WITH: RN Alesia Banda AT 6468 04/04/2019 BY L BENFIELD (NOTE) SARS-CoV-2 target nucleic acids are DETECTED. The SARS-CoV-2 RNA is generally detectable in upper and lower respiratory specimens during the acute phase of infection. Positive results are indicative of active infection with SARS-CoV-2. Clinical  correlation with patient history and other diagnostic information is necessary to determine patient infection status. Positive results do  not rule out bacterial infection or co-infection with other viruses. The expected result is Negative. Fact Sheet for Patients: SugarRoll.be Fact Sheet for Healthcare Providers: https://www.woods-mathews.com/ This test is not yet approved or cleared by the [place] FDA and  has been authorized for detection and/or diagnosis of SARS-CoV-2 by FDA under an Emergency Use Authorization (EUA). This EUA will remain  in effect (meaning this test can be [place] ed) for the duration of the COVID-19 declaration under Section 564(b)(1) of the Act, 21 U.S.C. section 360bbb-3(b)(1), unless the authorization is terminated or revoked sooner. Performed at [facility], [place] [address], [place], [place] [address]          Radiology Studies: No results found.      Scheduled Meds: . ARIPiprazole  2 mg Oral QHS  . carvedilol  6.25 mg Oral BID WC  . DULoxetine  20 mg Oral Daily  . feeding supplement (ENSURE ENLIVE)  237 mL Oral BID BM  . levothyroxine  100 mcg Oral Q0600  . multivitamin with minerals  1 tablet Oral Daily  . polyethylene glycol  17 g Oral BID   Continuous Infusions:        Aline August, MD [facility] 04/11/2019, 11:06 AM

## 2019-04-11 NOTE — Progress Notes (Signed)
Nutrition Follow-up  DOCUMENTATION CODES:   Not applicable  INTERVENTION:   -Continue Ensure Enlive po BID, each supplement provides 350 kcal and 20 grams of protein -Continue MVI with minerals daily  NUTRITION DIAGNOSIS:   Increased nutrient needs related to acute illness as evidenced by estimated needs.  Ongoing  GOAL:   Patient will meet greater than or equal to 90% of their needs  Progressing   MONITOR:   PO intake, Supplement acceptance, Labs, Weight trends, I & O's  REASON FOR ASSESSMENT:   Malnutrition Screening Tool    ASSESSMENT:   73 y.o. year old female with medical history significant for HTN, HLD, hypothyroidism, movement disorder/dystonia, wheelchair-bound, depression, tested positive for Covid on 10/16 (in [place]), hospitalized at [facility] 10/22-10/26 and treated with Decadron for COVID-19 pneumonia without hypoxia, admitted at home 11/7 for generalized weakness, decreased mobility and transfers in the setting of chronic dystonia, peripheral neuropathy complicated by metabolic encephalopathy.  At that time decline in her physical functional status thought to be a consequence of her recent COVID-19 viral infection: Negative work-up for acute CVA including head CT, CTA.  Reviewed I/O's: -320 ml x 24 hours and +761 ml x 24 hours  UOP: 800 ml x 24 hours  Attempted to speak with pt via phone, however, no answer.   Pt with waxing and waning mentation per chart review. Per CSW notes, plan for SNF placement at discharge.   Pt with improved oral intake; noted meal completion 75-100%. Pt is consuming Ensure supplements per MAR. RD will continue supplements given increased nutritional needs due to acute illness.   Medications reviewed and include miralax and MVI.   Labs reviewed: CBGS: 99-102.   Diet Order:   Diet Order            Diet Heart Room service appropriate? Yes; Fluid consistency: Thin  Diet effective now              EDUCATION NEEDS:    No education needs have been identified at this time  Skin:  Skin Assessment: Reviewed RN Assessment  Last BM:  04/06/19  Height:   Ht Readings from Last 1 Encounters:  04/03/19 5\' 4"  (1.626 m)    Weight:   Wt Readings from Last 1 Encounters:  04/11/19 74.5 kg    Ideal Body Weight:  54.5 kg  BMI:  Body mass index is 28.19 kg/m.  Estimated Nutritional Needs:   Kcal:  1800-2000  Protein:  80-90g  Fluid:  1.8L/day    Analyn Matusek A. Jimmye Norman, RD, LDN, [place] Registered Dietitian II Certified Diabetes Care and Education Specialist Pager: [phone] After hours Pager: [phone]

## 2019-04-11 NOTE — Care Management Important Message (Signed)
Important Message  Patient Details  Name: Debra Morgan MRN: [id number] Date of Birth: 05-13-1946   Medicare Important Message Given:  Yes     Ersel Wadleigh Montine Circle 04/11/2019, 3:18 PM

## 2019-04-11 NOTE — Progress Notes (Signed)
Occupational Therapy Treatment Patient Details Name: Debra Morgan MRN: [id number] DOB: Sep 22, 1945 Today's Date: 04/11/2019    History of present illness 73 y.o. female with history of hypertension, hyperlipidemia, hypothyroidism, dystonia was recently admitted on March 07, 2019 last month for COVID-19 infection subsequent to which patient has become increasingly encephalopathic with difficulty walking with bursts of agitation.EEG showed nonspecific cortical dysfunction in left temporal region MRI brain showed no acute abnormality   OT comments  Patient semi-supine in bed, agreeable to OT. Patient require encouragement/reassurance to participate in out of bed activity due to fear of falling. Patient mobilize to edge of bed modified independent with head of bed elevated and was set up assist for oral care, washing her face and combing her hair. Patient agreeable to try standing at edge of bed, but repeatedly states "I can't do it." Max verbal cues for body mechanics and encouragement, max A to power up. Patient maintain standing balance with min/mod A and rolling walker, cue patient for weight shifting however patient is anxious "I can't, I can't, I need to sit down." Mod I for sit to supine.   Follow Up Recommendations  Supervision/Assistance - 24 hour    Equipment Recommendations  None recommended by OT       Precautions / Restrictions Precautions Precautions: Fall;Other (comment) Precaution Comments: h/o falls due to dystonia and R LE locking up Restrictions Weight Bearing Restrictions: No       Mobility Bed Mobility Overal bed mobility: Modified Independent             General bed mobility comments: increased time with HOB elevated  Transfers Overall transfer level: Needs assistance Equipment used: Rolling walker (2 wheeled) Transfers: Sit to/from Stand Sit to Stand: Max assist              Balance Overall balance assessment: Needs assistance Sitting-balance  support: Feet supported;No upper extremity supported Sitting balance-Leahy Scale: Good     Standing balance support: Bilateral upper extremity supported Standing balance-Leahy Scale: Poor                             ADL either performed or assessed with clinical judgement   ADL Overall ADL's : Needs assistance/impaired     Grooming: Oral care;Wash/dry face;Brushing hair;Set up;Sitting               Lower Body Dressing: Total assistance;Sitting/lateral leans Lower Body Dressing Details (indicate cue type and reason): patient anxious to attempt doffing socks and donning shoes Toilet Transfer: Maximal assistance;RW Toilet Transfer Details (indicate cue type and reason): simulated with sit to stand from edge of bed, patient unable to weight sift once in standing despite verbal, tactile cues. patient is anxious "I need to sit back down"           General ADL Comments: patient is fearful of falling frequently saying "I can't"     Vision       Perception     Praxis      Cognition Arousal/Alertness: Awake/alert Behavior During Therapy: Anxious Overall Cognitive Status: No family/caregiver present to determine baseline cognitive functioning Area of Impairment: Awareness;Following commands                       Following Commands: Follows one step commands inconsistently;Follows one step commands with increased time   Awareness: Emergent   General Comments: reports that she has dementia/Alz, and she frequently states that she cannot  do things, becomes anxious when mentioning mobility OOB.                    Pertinent Vitals/ Pain       Pain Assessment: 0-10 Pain Score: 10-Worst pain ever Pain Location: R buttock and thigh Pain Descriptors / Indicators: Aching;Grimacing(intermittent) Pain Intervention(s): Limited activity within patient's tolerance;Monitored during session;Repositioned         Frequency  Min 2X/week         Progress Toward Goals  OT Goals(current goals can now be found in the care plan section)  Progress towards OT goals: Progressing toward goals  Acute Rehab OT Goals Patient Stated Goal: to figure out what is going on OT Goal Formulation: With patient Time For Goal Achievement: 04/21/19 Potential to Achieve Goals: Good ADL Goals Pt Will Perform Lower Body Bathing: with min guard assist;sit to/from stand;sitting/lateral leans Pt Will Perform Upper Body Dressing: with set-up;sitting Pt Will Perform Lower Body Dressing: with min assist;sitting/lateral leans;sit to/from stand Pt Will Transfer to Toilet: with supervision;stand pivot transfer;bedside commode Pt Will Perform Toileting - Clothing Manipulation and hygiene: with supervision;sitting/lateral leans Additional ADL Goal #2: Pt will independently recall 3 strategies for reducing anxiety prior to transfers and engaging in ADL activity  Plan Discharge plan remains appropriate       AM-PAC OT "6 Clicks" Daily Activity     Outcome Measure   Help from another person eating meals?: None Help from another person taking care of personal grooming?: A Little Help from another person toileting, which includes using toliet, bedpan, or urinal?: A Lot Help from another person bathing (including washing, rinsing, drying)?: A Lot Help from another person to put on and taking off regular upper body clothing?: A Little Help from another person to put on and taking off regular lower body clothing?: A Lot 6 Click Score: 16    End of Session Equipment Utilized During Treatment: Gait belt;Rolling walker  OT Visit Diagnosis: Unsteadiness on feet (R26.81);Other abnormalities of gait and mobility (R26.89);Muscle weakness (generalized) (M62.81);Other symptoms and signs involving cognitive function   Activity Tolerance (Patient limited by anxiety)   Patient Left in bed;with call bell/phone within reach;with bed alarm set   Nurse Communication Mobility  status        Time: 3846-6599 OT Time Calculation (min): 26 min  Charges: OT General Charges $OT Visit: 1 Visit OT Treatments $Self Care/Home Management : 23-37 mins  Myrtie Neither OT OT office: [phone]   Carmelia Roller 04/11/2019, 1:35 PM

## 2019-04-12 ENCOUNTER — Inpatient Hospital Stay (HOSPITAL_COMMUNITY): Payer: Medicare Other

## 2019-04-12 IMAGING — DX DG CHEST 1V PORT
1 series · 1 of 1 positions shown · non-contrast
Comparison: [DATE].

CLINICAL DATA: Dyspnea, productive cough.

EXAM:
PORTABLE CHEST 1 VIEW

[chest]
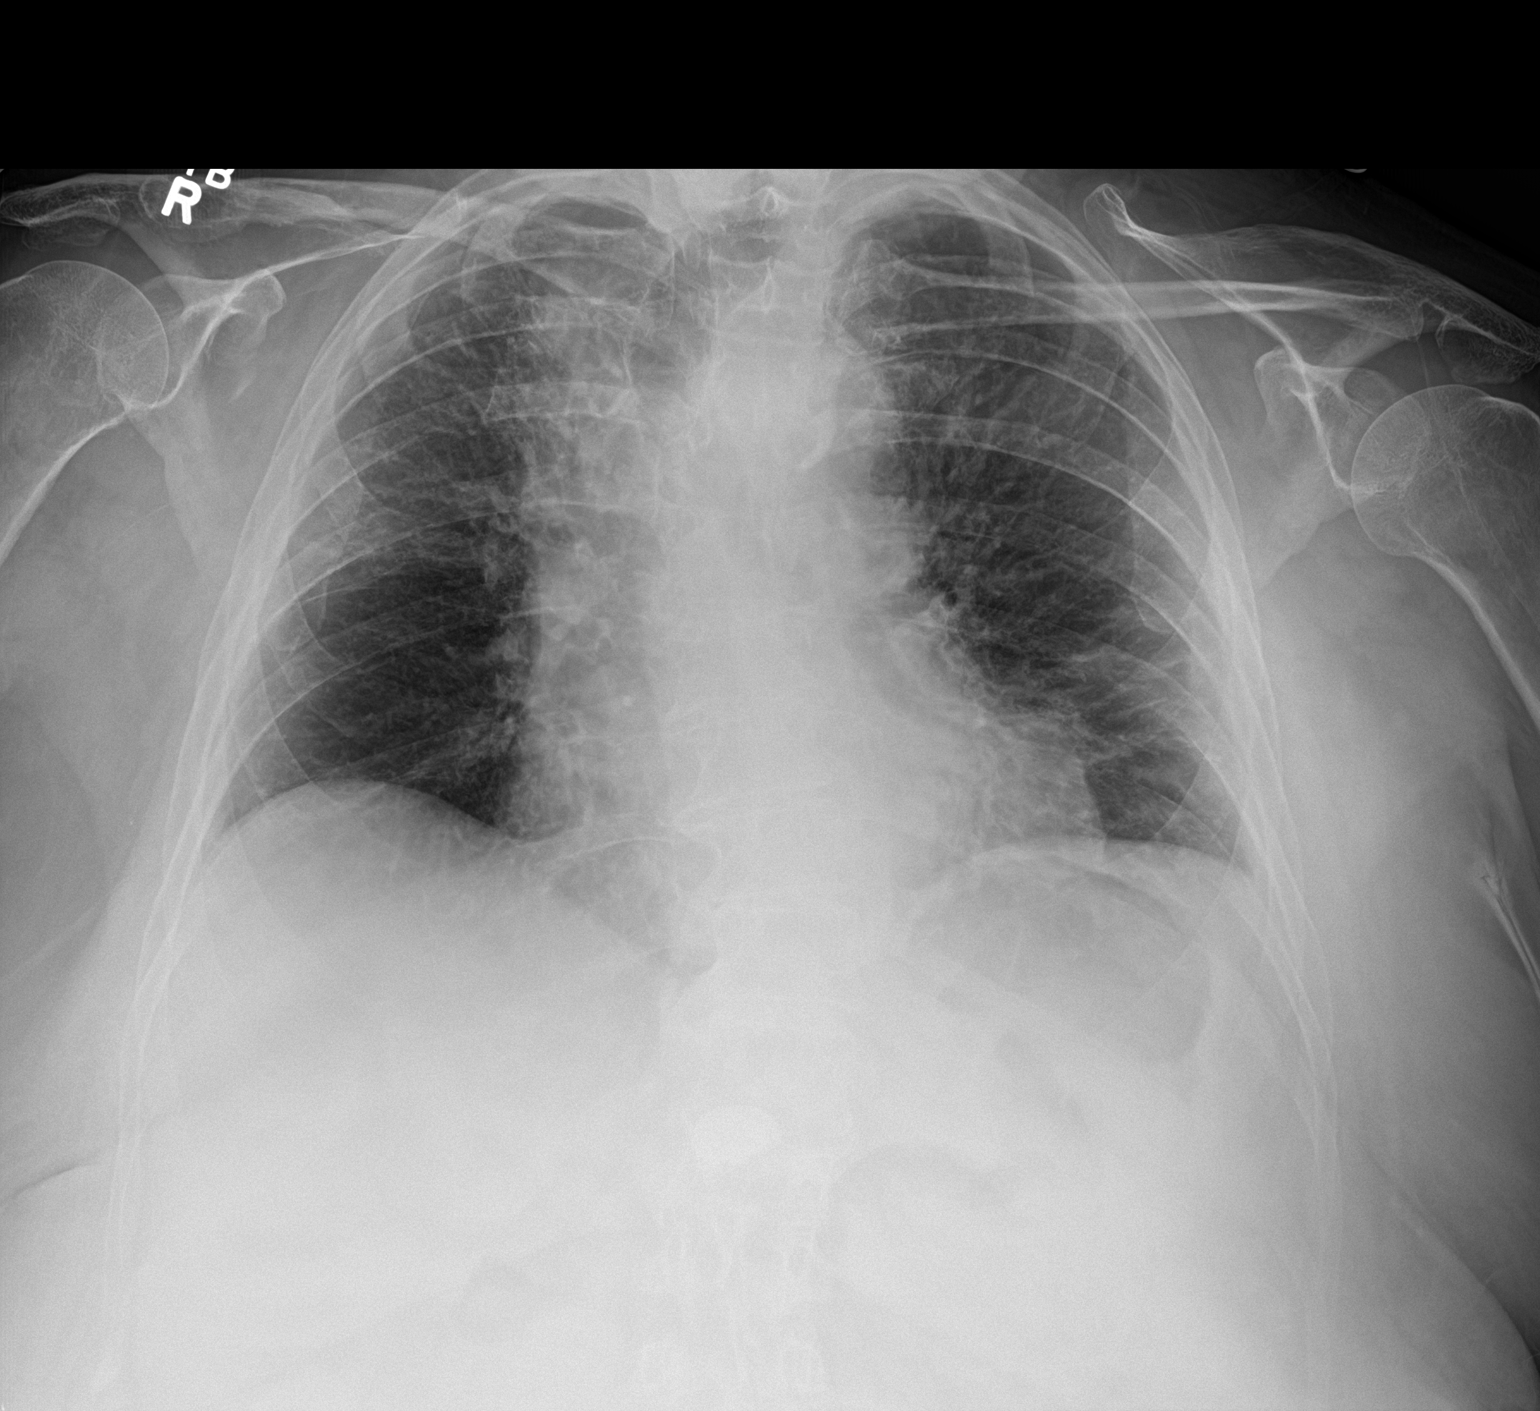

[1 of 1 positions shown; findings below may reference images not displayed]

FINDINGS: Stable cardiomediastinal silhouette. No pneumothorax or pleural
effusion is noted. Stable mild left basilar opacity is noted with
stable right midlung opacity. Bony thorax is unremarkable.
IMPRESSION: Stable mild left basilar opacity is noted concerning for pneumonia.
Stable right midlung opacity is noted concerning for pneumonia.

## 2019-04-12 MED ORDER — DULOXETINE HCL 20 MG PO CPEP: 20.0000 mg | ORAL_CAPSULE | Freq: Every day | ORAL | 0 refills | Status: AC

## 2019-04-12 MED ORDER — LEVOTHYROXINE SODIUM 100 MCG PO TABS: 100.0000 ug | ORAL_TABLET | Freq: Every day | ORAL | 0 refills | Status: AC

## 2019-04-12 MED ORDER — ONDANSETRON HCL 4 MG PO TABS: 4.0000 mg | ORAL_TABLET | Freq: Four times a day (QID) | ORAL | 0 refills | Status: AC | PRN

## 2019-04-12 MED ORDER — GUAIFENESIN-DM 100-10 MG/5ML PO SYRP: 10.0000 mL | ORAL_SOLUTION | ORAL | 1 refills | Status: AC | PRN

## 2019-04-12 MED ORDER — ARIPIPRAZOLE 2 MG PO TABS: 2.0000 mg | ORAL_TABLET | Freq: Every day | ORAL | 0 refills | Status: AC

## 2019-04-12 MED ORDER — BENZTROPINE MESYLATE 1 MG PO TABS: 1.0000 mg | ORAL_TABLET | Freq: Two times a day (BID) | ORAL | 0 refills | Status: AC

## 2019-04-12 MED ORDER — AMOXICILLIN-POT CLAVULANATE 875-125 MG PO TABS: 1.0000 | ORAL_TABLET | Freq: Two times a day (BID) | ORAL | 0 refills | Status: AC

## 2019-04-12 NOTE — Progress Notes (Signed)
Patient was discharged home by MD order; discharged instructions review and give to patient with care notes; IV DIC; skin intact; patient was educated to use Chiropodist at home; patient will be escorted to the car by nurse tech via wheelchair.

## 2019-04-12 NOTE — Progress Notes (Signed)
Patient's husband called Probation officer and asked to talk with MD regarding his wife's discharge. He thinks that patient should not be D/C today because she is coughing. MD was notified. Will continue to monitor.

## 2019-04-12 NOTE — Discharge Summary (Addendum)
Physician Discharge Summary  Kambrie Eddleman YQM:[id number] DOB: [birth year] DOA: 04/03/2019  PCP: Karleen Hampshire., MD  Admit date: 04/03/2019 Discharge date: 04/12/2019  Admitted From: Home Disposition: Home.  Refused SNF placement  Recommendations for Outpatient Follow-up:  1. Follow up with PCP in 1 week with repeat CBC/BMP 2. Outpatient follow-up with neurology/psychiatry.  Recommend outpatient evaluation and follow-up by palliative care for goals of care discussion. 3. Follow up in ED if symptoms worsen or new appear   Home Health: PT/OT Equipment/Devices: None  Discharge Condition: Stable CODE STATUS: Full Diet recommendation: Heart healthy  Brief/Interim Summary: 73 year old female with history of hypertension, hyperlipidemia, hypothyroidism, movement disorder/dystonia, wheelchair-bound, depression, tested positive for Covid on 03/04/2019 (in [place]), hospitalized at [facility] from 03/10/2019-03/14/2019 and treated with Decadron for COVID-19 pneumonia without hypoxia, admitted to our hospital on 03/16/2019 for generalized weakness and subsequently transferred to [facility] and subsequently had change in mental status for which her work-up for CVA was negative and was subsequently discharged home on 03/26/2019.  At that time, decline in her physical functional status was thought to be a consequence of her recent COVID-19 viral infection; psychiatry had also evaluated the patient and BuSpar was discontinued and she was started on Lexapro.  She presented again on 04/03/2019 for worsening confusion, agitation.  Neurology and psychiatry were consulted.  During the hospitalization, patient had medications adjusted by psychiatry.  Neurology thought that patient might have symptoms may be secondary to prolonged Covid encephalopathy and subsequently neurology signed off.  Family was interested in SNF placement for patient.  Patient refused SNF placement.  She will be discharged home with home health  PT/OT.   Discharge Diagnoses:   Waxing and waning confusion, suspect combination of Covid encephalopathy with hospital-acquired delirium and somatization -Work-up at this point without notable abnormalities.  MRI without acute abnormality -EEG with nonspecific cortical dysfunction in the left temporal region.  No seizures or epileptiform discharges were seen throughout the recording -ESR elevated at 90; CRP negative -Urine culture with multiple bacterial morphotypes, none predominant.  Patient has been afebrile -Some concern for malingering by previous providers as well -Neurology has seen the patient and was concerned for Covid encephalopathy, which can be prolonged; neurology has subsequently signed off.  Outpatient follow-up with neurology -Delirium precautions -Psychiatry saw her again on 04/07/2019: Lexapro was stopped.  She was started on Cymbalta and Abilify.  Outpatient follow-up with psychiatry.  No need for inpatient psychiatric hospitalization. -Family requesting SNF placement.  OT is recommending 24-hour supervision.  -Family had also requested transfer for second opinion.  Prior hospitalist had called [facility] and [facility], who both declined transfer -Patient refused SNF placement.  She will be discharged home with home health PT/OT.  Persistent right lower extremity pain, seems to be chronic -Lumbar x-ray showed chronic compression fractures without any evidence of acute pathology -Continue PT eval.   outpatient follow-up.  Generalized abdominal pain, improving -Liver function unremarkable.  Abdominal x-ray negative.  Tolerating oral intake.  Recent COVID-19 infection in October 2020 with pneumonia -Currently no pulmonary symptoms.   -Case was discussed with Dr. Luvenia Heller by prior provider: As initial test for Covid was positive on 03/04/2019, Covid isolation can be discontinued.  Subsequently isolation has been discontinued. -complains of increasing cough for the last 2 days.   Chest x-ray done today shows stable opacities bilaterally.  Patient had opacities during her last chest x-ray as well most likely from Covid pneumonia.  Husband is concerned about the same.  Will empirically treat with  Augmentin for 5 days upon discharge.  Hypothyroidism -Continue Synthroid.    Dose of Synthroid was increased 200 mcg daily during this hospitalization.  Outpatient follow-up  Bradycardia  -Heart rate stable.  Continue Coreg.   Hypertension -Blood pressure on the lower side.  Continue Coreg.  Home candesartan and Maxzide are on hold.  Outpatient follow-up with PCP.  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  Per neuro note from [facility], 4 to 5-year history of progressive freezing of gait most prominent in the right leg -Outpatient follow-up with neurology -Cogentin has been resumed. Patient was on Cogentin during last hospitalization.  Will discharge on Cogentin.  Outpatient follow-up.  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  Outpatient follow-up with psychiatry.  Generalized deconditioning -Patient is currently listed as partial code.  Will request palliative care evaluation for goals of care discussion.  Palliative care evaluation pending.  This can occur as an outpatient for goals of care discussion.  Discharge Instructions  Discharge Instructions    Ambulatory referral to Neurology   Complete by: As directed    An appointment is requested in approximately: 1-2 weeks   Diet - low sodium heart healthy   Complete by: As directed    Increase activity slowly   Complete by: As directed      Allergies as of 04/12/2019      Reactions   Sinemet [carbidopa W-levodopa] Other (See Comments)   Feels "jittery"   Codeine Palpitations   Tetracyclines & Related Rash      Medication List    STOP taking these medications   ADVIL  COLD/SINUS PO   candesartan 4 MG tablet Commonly known as: ATACAND   escitalopram 10 MG tablet Commonly known as: LEXAPRO   escitalopram 5 MG tablet Commonly known as: LEXAPRO   triamterene-hydrochlorothiazide 37.5-25 MG tablet Commonly known as: MAXZIDE-25     TAKE these medications   acetaminophen 500 MG tablet Commonly known as: TYLENOL Take 1,000 mg by mouth every 6 (six) hours as needed for headache.   Airborne Tbef Take 1 tablet by mouth daily as needed (immune system boost).   amoxicillin-clavulanate 875-125 MG tablet Commonly known as: Augmentin Take 1 tablet by mouth 2 (two) times daily for 5 days.   ARIPiprazole 2 MG tablet Commonly known as: ABILIFY Take 1 tablet (2 mg total) by mouth at bedtime.   benztropine 1 MG tablet Commonly known as: COGENTIN Take 1 tablet (1 mg total) by mouth 2 (two) times daily.   carvedilol 6.25 MG tablet Commonly known as: Coreg Take 1 tablet (6.25 mg total) by mouth 2 (two) times daily.   DULoxetine 20 MG capsule Commonly known as: CYMBALTA Take 1 capsule (20 mg total) by mouth daily. Start taking on: April 13, 2019   ezetimibe 10 MG tablet Commonly known as: ZETIA Take 1 tablet (10 mg total) by mouth daily.   guaiFENesin-dextromethorphan 100-10 MG/5ML syrup Commonly known as: ROBITUSSIN DM Take 10 mLs by mouth every 4 (four) hours as needed for cough.   levothyroxine 100 MCG tablet Commonly known as: SYNTHROID Take 1 tablet (100 mcg total) by mouth daily at 6 (six) AM. Start taking on: April 13, 2019 What changed:   medication strength  how much to take   multivitamin with minerals Tabs tablet Take 1 tablet by mouth daily.   ondansetron 4 MG tablet Commonly known as: ZOFRAN Take 1 tablet (4 mg total) by mouth every 6 (  six) hours as needed for nausea.       Follow-up Information    Karleen Hampshire., MD. Schedule an appointment as soon as possible for a visit in 1 week(s).   Specialty: Internal  Medicine Why: with cbc/bmp Contact information: [address] [place] [address] [phone]        Psychiatrist. Schedule an appointment as soon as possible for a visit in 1 week(s).          Allergies  Allergen Reactions  . Sinemet [Carbidopa W-Levodopa] Other (See Comments)    Feels "jittery"  . Codeine Palpitations  . Tetracyclines & Related Rash    Consultations:  Neurology/psychiatry   Procedures/Studies: Ct Code Stroke Cta Head W/wo Contrast  Result Date: 03/25/2019 CLINICAL DATA:  Altered mental status.  Hemorrhage suspected EXAM: CT ANGIOGRAPHY HEAD AND NECK TECHNIQUE: Multidetector CT imaging of the head and neck was performed using the standard protocol during bolus administration of intravenous contrast. Multiplanar CT image reconstructions and MIPs were obtained to evaluate the vascular anatomy. Carotid stenosis measurements (when applicable) are obtained utilizing NASCET criteria, using the distal internal carotid diameter as the denominator. CONTRAST:  68m OMNIPAQUE IOHEXOL 350 MG/ML SOLN COMPARISON:  Head CT from earlier today FINDINGS: CTA NECK FINDINGS Aortic arch: Atherosclerotic plaque.  Three vessel branching. Right carotid system: Tortuous ICA. Vessels are smooth and widely patent. No atheromatous changes. Left carotid system: Atherosclerotic calcification about the bifurcation that is mild for age. No stenosis or ulceration. Vertebral arteries: No brachiocephalic or proximal subclavian flow limiting stenosis. Strong dominance of the right vertebral artery which is smooth and widely patent to the dura. A diminutive left vertebral artery is also patent to the dura although most flow is into the left occipital soft tissues. Skeleton: No acute or aggressive finding Other neck: Negative Upper chest: Patchy airspace disease in the upper lungs correlating with history of COVID-19 positivity Review of the MIP images confirms the above findings CTA HEAD  FINDINGS Anterior circulation: Vessels are smooth and widely patent. No branch occlusion, beading, or aneurysm. Posterior circulation: Strong right vertebral artery dominance. A tiny left vertebral artery does reach the basilar. Aplastic right and hypoplastic left P1 segment. No branch occlusion, beading, or aneurysm Venous sinuses: Patent Anatomic variants: As above Review of the MIP images confirms the above findings IMPRESSION: 1. No emergent vascular finding. 2. Biapical airspace disease correlating with history of COVID-19 positivity. 3. Mild for age atherosclerosis. Electronically Signed   By: JMonte FantasiaM.D.   On: 03/25/2019 05:15   Dg Lumbar Spine 2-3 Views  Result Date: 04/04/2019 CLINICAL DATA:  Fall EXAM: LUMBAR SPINE - 2-3 VIEW COMPARISON:  02/04/2019 FINDINGS: Chronic severe compression deformity at L1 with status post vertebroplasty, stable. No acute fracture. Degenerative disc and facet disease throughout the lumbar spine. No subluxation. SI joints symmetric and unremarkable. IMPRESSION: No acute bony abnormality. Electronically Signed   By: KRolm BaptiseM.D.   On: 04/04/2019 19:28   Ct Head Wo Contrast  Result Date: 03/31/2019 CLINICAL DATA:  Altered mental status EXAM: CT HEAD WITHOUT CONTRAST TECHNIQUE: Contiguous axial images were obtained from the base of the skull through the vertex without intravenous contrast. COMPARISON:  03/25/2019 FINDINGS: Brain: No evidence of acute infarction, hemorrhage, hydrocephalus, extra-axial collection or mass lesion/mass effect. Periventricular and deep white matter hypodensity. Vascular: No hyperdense vessel or unexpected calcification. Skull: Normal. Negative for fracture or focal lesion. Sinuses/Orbits: No acute finding. Other: None. IMPRESSION: No acute intracranial pathology.  Small-vessel white matter disease. Electronically Signed   By: Eddie Candle M.D.   On: 03/31/2019 13:52   Ct Code Stroke Cta Neck W/wo Contrast  Result Date:  03/25/2019 CLINICAL DATA:  Altered mental status.  Hemorrhage suspected EXAM: CT ANGIOGRAPHY HEAD AND NECK TECHNIQUE: Multidetector CT imaging of the head and neck was performed using the standard protocol during bolus administration of intravenous contrast. Multiplanar CT image reconstructions and MIPs were obtained to evaluate the vascular anatomy. Carotid stenosis measurements (when applicable) are obtained utilizing NASCET criteria, using the distal internal carotid diameter as the denominator. CONTRAST:  54m OMNIPAQUE IOHEXOL 350 MG/ML SOLN COMPARISON:  Head CT from earlier today FINDINGS: CTA NECK FINDINGS Aortic arch: Atherosclerotic plaque.  Three vessel branching. Right carotid system: Tortuous ICA. Vessels are smooth and widely patent. No atheromatous changes. Left carotid system: Atherosclerotic calcification about the bifurcation that is mild for age. No stenosis or ulceration. Vertebral arteries: No brachiocephalic or proximal subclavian flow limiting stenosis. Strong dominance of the right vertebral artery which is smooth and widely patent to the dura. A diminutive left vertebral artery is also patent to the dura although most flow is into the left occipital soft tissues. Skeleton: No acute or aggressive finding Other neck: Negative Upper chest: Patchy airspace disease in the upper lungs correlating with history of COVID-19 positivity Review of the MIP images confirms the above findings CTA HEAD FINDINGS Anterior circulation: Vessels are smooth and widely patent. No branch occlusion, beading, or aneurysm. Posterior circulation: Strong right vertebral artery dominance. A tiny left vertebral artery does reach the basilar. Aplastic right and hypoplastic left P1 segment. No branch occlusion, beading, or aneurysm Venous sinuses: Patent Anatomic variants: As above Review of the MIP images confirms the above findings IMPRESSION: 1. No emergent vascular finding. 2. Biapical airspace disease correlating with  history of COVID-19 positivity. 3. Mild for age atherosclerosis. Electronically Signed   By: JMonte FantasiaM.D.   On: 03/25/2019 05:15   Ct Angio Chest Pe W And/or Wo Contrast  Result Date: 03/16/2019 CLINICAL DATA:  Shortness of breath, COVID-19 positive. EXAM: CT ANGIOGRAPHY CHEST WITH CONTRAST TECHNIQUE: Multidetector CT imaging of the chest was performed using the standard protocol during bolus administration of intravenous contrast. Multiplanar CT image reconstructions and MIPs were obtained to evaluate the vascular anatomy. CONTRAST:  722mOMNIPAQUE IOHEXOL 350 MG/ML SOLN COMPARISON:  CTA November 04, 2018 FINDINGS: Cardiovascular: Satisfactory opacification of the pulmonary arteries to the lobar level. More distal evaluation limited by respiratory motion and underlying parenchymal disease. No pulmonary artery filling defects are seen to the lobar level. Pulmonary arterial caliber is upper limits normal, unchanged from comparison. Atherosclerotic plaque of the nonaneurysmal thoracic aorta. Normal 3 vessel branching of the arch with scant calcification in the proximal great vessels. Mild cardiomegaly with biatrial enlargement. No pericardial effusion. No elevation of the RV/LV ratio (0.75). Mediastinum/Nodes: No enlarged mediastinal, hilar or axillary lymph nodes. Thyroid gland, trachea, and esophagus demonstrate no significant findings. Lungs/Pleura: Multifocal areas peripheral predominant ground-glass are present in the lungs. No pneumothorax or effusion. Mild septal thickening noted in the apices and bases. Diffuse airways thickening and scattered secretions. Upper Abdomen: Non rotation of both kidneys, anatomic variant. Partial fatty replacement of the pancreas. Slight reflux of contrast into the hepatic veins. Musculoskeletal: Multilevel degenerative changes are present in the imaged portions of the spine. No acute osseous abnormality or suspicious osseous lesion. No concerning chest wall lesions  S-shaped curvature of the thoracolumbar spine vertebroplasty changes at L1, partially  visualized. Review of the MIP images confirms the above findings. IMPRESSION: 1. No evidence of pulmonary embolism to the lobar level. More distal evaluation limited by respiratory motion and underlying parenchymal disease. 2. Multifocal areas of peripheral predominant ground-glass in the lungs, compatible with atypical infection in the setting of known COVID-19 positivity. 3. Some septal thickening in the apices may reflect superimposed edema. 4. Aortic Atherosclerosis (ICD10-I70.0). Electronically Signed   By: Lovena Le M.D.   On: 03/16/2019 21:15   Mr Brain Wo Contrast  Result Date: 04/04/2019 CLINICAL DATA:  Altered level of consciousness. EXAM: MRI HEAD WITHOUT CONTRAST TECHNIQUE: Multiplanar, multiecho pulse sequences of the brain and surrounding structures were obtained without intravenous contrast. COMPARISON:  CT head 03/31/2019.  MRI 12/01/2018 FINDINGS: Brain: Negative for acute infarct. Scattered subcortical and deep white matter hyperintensities similar to the prior MRI. Generalized atrophy without hydrocephalus. Negative for hemorrhage or mass. No midline shift. Vascular: Normal arterial flow voids Skull and upper cervical spine: Negative Sinuses/Orbits: Mucosal edema paranasal sinuses. Bilateral cataract surgery Other: None IMPRESSION: No acute abnormality Atrophy and chronic white matter changes most likely microvascular ischemia are stable from prior studies. Electronically Signed   By: Franchot Gallo M.D.   On: 04/04/2019 10:13   Ct Abdomen Pelvis W Contrast  Result Date: 03/31/2019 CLINICAL DATA:  Generalized abdominal pain.  Fever and confusion. EXAM: CT ABDOMEN AND PELVIS WITH CONTRAST TECHNIQUE: Multidetector CT imaging of the abdomen and pelvis was performed using the standard protocol following bolus administration of intravenous contrast. CONTRAST:  11m OMNIPAQUE IOHEXOL 300 MG/ML  SOLN  COMPARISON:  Chest CT 03/16/2019 FINDINGS: Lower chest: Patchy E bibasilar infiltrates and scattered small pulmonary nodules. No pleural effusions. The heart is borderline enlarged. No pericardial effusion. Coronary artery and aortic calcifications are noted. Hepatobiliary: No focal hepatic lesions or intrahepatic biliary dilatation. The gallbladder appears normal. No common bile duct dilatation. Pancreas: No mass, inflammation or ductal dilatation. Spleen: Normal size.  No focal lesions. Adrenals/Urinary Tract: The adrenal glands are normal. No renal, ureteral or bladder calculi or mass. Mild distention of the bladder. Stomach/Bowel: The stomach, duodenum, small bowel and colon are unremarkable. No acute inflammatory changes, mass lesions or obstructive findings. The terminal ileum and appendix are normal. Vascular/Lymphatic: Moderate atherosclerotic calcifications involving the aorta and branch vessels but no aneurysm. The major venous structures are patent. No mesenteric or retroperitoneal mass or adenopathy. Small scattered lymph nodes are noted. Reproductive: Uterine fibroids are noted. The endometrium is borderline measuring 7.5 mm. Follow-up pelvic ultrasound examination is suggested. Both ovaries appear normal. Other: No free pelvic fluid collections. No inguinal mass or adenopathy. Musculoskeletal: No significant bony findings. Vertebral augmentation changes are noted at L1 with moderate retropulsion. Osteoporosis is noted. IMPRESSION: 1. No acute abdominal/pelvic findings, mass lesions or adenopathy. 2. Patchy bilateral lower lobe infiltrates and a few small pulmonary nodules. This appears slightly progressive when compared to the prior CT scan where most of the infiltrates or upper lobe predominant. The small nodules are likely inflammatory. 3. Slightly prominent endometrium for age. Recommend follow-up pelvic ultrasound examination. Uterine fibroids are noted. The ovaries are normal. 4. Stable vertebral  augmentation changes at L1. Electronically Signed   By: PMarijo SanesM.D.   On: 03/31/2019 14:04   Dg Chest Portable 1 View  Result Date: 04/01/2019 CLINICAL DATA:  COVID positive. Chest pain, shortness of breath, cough EXAM: PORTABLE CHEST 1 VIEW COMPARISON:  03/31/2019 FINDINGS: Patchy airspace disease in the right upper lobe and left lower lobe have  improved. Mild residual opacities remain in both areas, most pronounced at the left base. No effusions. Heart is borderline in size. No acute bony abnormality. IMPRESSION: Improving bilateral airspace opacities. Electronically Signed   By: Rolm Baptise M.D.   On: 04/01/2019 20:17   Dg Chest Port 1 View  Result Date: 03/31/2019 CLINICAL DATA:  Altered level of consciousness and weakness. Coronavirus infection. EXAM: PORTABLE CHEST 1 VIEW COMPARISON:  03/29/2019 FINDINGS: Heart size is normal. Chronic aortic atherosclerosis. Patchy bilateral perihilar pulmonary density could represent sequela of viral pneumonia. No evidence of dense consolidation or lobar collapse. No visible effusion. No change since the study of 2 days ago. IMPRESSION: Patchy perihilar density could represent viral pneumonia. No significant change since 2 days ago. Electronically Signed   By: Nelson Chimes M.D.   On: 03/31/2019 14:43   Dg Chest Port 1 View  Result Date: 03/25/2019 CLINICAL DATA:  Altered mental status EXAM: PORTABLE CHEST 1 VIEW COMPARISON:  01/14/2019 FINDINGS: Lower volumes and increased patchy bilateral pulmonary opacity. Cardiomegaly and vascular pedicle widening accentuated by rotation and lower volumes. No visible effusion or pneumothorax IMPRESSION: Patchy bilateral infiltrate correlating with history of COVID-19 positivity. Opacity and inflation has worsened since 01/14/2019. Electronically Signed   By: Monte Fantasia M.D.   On: 03/25/2019 05:36   Dg Chest Portable 1 View  Result Date: 03/16/2019 CLINICAL DATA:  Initial evaluation for acute cough, weakness,  OB positive. EXAM: PORTABLE CHEST 1 VIEW COMPARISON:  Prior radiograph from 11/11/2018. FINDINGS: Cardiac and mediastinal silhouettes within normal limits. Lungs mildly hypoinflated. Linear atelectatic changes noted within the right perihilar region, lingula, and left lung base. No other focal airspace disease. No edema or effusion. No pneumothorax. No acute osseous finding. IMPRESSION: 1. Mild scattered subsegmental atelectasis within the right perihilar region and left lung base. 2. No other radiographic evidence for active cardiopulmonary disease. Electronically Signed   By: Jeannine Boga M.D.   On: 03/16/2019 19:15   Dg Abd Portable 1v  Result Date: 04/04/2019 CLINICAL DATA:  Abdominal pain EXAM: PORTABLE ABDOMEN - 1 VIEW COMPARISON:  None. FINDINGS: The bowel gas pattern is normal. No radio-opaque calculi or other significant radiographic abnormality are seen. IMPRESSION: Negative. Electronically Signed   By: Kerby Moors M.D.   On: 04/04/2019 19:28   Ct Head Code Stroke Wo Contrast  Result Date: 03/25/2019 CLINICAL DATA:  Code stroke. Altered mental status with unclear cause EXAM: CT HEAD WITHOUT CONTRAST TECHNIQUE: Contiguous axial images were obtained from the base of the skull through the vertex without intravenous contrast. COMPARISON:  03/10/2019 FINDINGS: Brain: No evidence of acute infarction, hemorrhage, hydrocephalus, extra-axial collection or mass lesion/mass effect. Mild patchy low-density in the cerebral white matter attributed to chronic small vessel ischemia. Central predominant cerebral volume loss. Vascular: No hyperdense vessel or unexpected calcification. Skull: Normal. Negative for fracture or focal lesion. Sinuses/Orbits: Negative Other: Page was placed 03/25/2019 at 4:45 am to provider [place] . ASPECTS [facility] Stroke Program Early CT Score) Not scored without localizing symptoms IMPRESSION: 1. No acute finding. 2. Mild chronic small vessel disease. Electronically  Signed   By: Monte Fantasia M.D.   On: 03/25/2019 04:47   Vas [place] Lower Extremity Venous (dvt)  Result Date: 03/20/2019  Lower Venous Study Indications: Elevated Ddimer.  Risk Factors: COVID 19 positive. Limitations: Poor ultrasound/tissue interface. Comparison Study: No prior studies. Performing Technologist: Oliver Hum RVT  Examination Guidelines: A complete evaluation includes B-mode imaging, spectral Doppler, color Doppler, and power Doppler as needed  of all accessible portions of each vessel. Bilateral testing is considered an integral part of a complete examination. Limited examinations for reoccurring indications may be performed as noted.  +---------+---------------+---------+-----------+----------+--------------+ RIGHT    CompressibilityPhasicitySpontaneityPropertiesThrombus Aging +---------+---------------+---------+-----------+----------+--------------+ CFV      Full           Yes      Yes                                 +---------+---------------+---------+-----------+----------+--------------+ SFJ      Full                                                        +---------+---------------+---------+-----------+----------+--------------+ FV Prox  Full                                                        +---------+---------------+---------+-----------+----------+--------------+ FV Mid   Full                                                        +---------+---------------+---------+-----------+----------+--------------+ FV DistalFull                                                        +---------+---------------+---------+-----------+----------+--------------+ PFV      Full                                                        +---------+---------------+---------+-----------+----------+--------------+ POP      Full           Yes      Yes                                  +---------+---------------+---------+-----------+----------+--------------+ PTV      Full                                                        +---------+---------------+---------+-----------+----------+--------------+ PERO     Full                                                        +---------+---------------+---------+-----------+----------+--------------+   +---------+---------------+---------+-----------+----------+--------------+ LEFT     CompressibilityPhasicitySpontaneityPropertiesThrombus Aging +---------+---------------+---------+-----------+----------+--------------+ CFV      Full  Yes      Yes                                 +---------+---------------+---------+-----------+----------+--------------+ SFJ      Full                                                        +---------+---------------+---------+-----------+----------+--------------+ FV Prox  Full                                                        +---------+---------------+---------+-----------+----------+--------------+ FV Mid   Full                                                        +---------+---------------+---------+-----------+----------+--------------+ FV DistalFull                                                        +---------+---------------+---------+-----------+----------+--------------+ PFV      Full                                                        +---------+---------------+---------+-----------+----------+--------------+ POP      Full           Yes      Yes                                 +---------+---------------+---------+-----------+----------+--------------+ PTV      Full                                                        +---------+---------------+---------+-----------+----------+--------------+ PERO     Full                                                         +---------+---------------+---------+-----------+----------+--------------+     Summary: Right: There is no evidence of deep vein thrombosis in the lower extremity. No cystic structure found in the popliteal fossa. Left: There is no evidence of deep vein thrombosis in the lower extremity. No cystic structure found in the popliteal fossa.  *See table(s) above for measurements and observations. Electronically signed by Deitra Mayo MD on 03/20/2019 at 11:30:52 AM.    Final  EEG IMPRESSION: This study issuggestive of nonspecific cortical dysfunction in left temporal region.No seizures or epileptiform discharges were seen throughout the recording.   Subjective: Patient seen and examined at bedside.  Poor historian.  No overnight fever, vomiting or worsening shortness of breath or agitation reported.  She feels better and wants to go home.  Complains of some cough.  Discharge Exam: Vitals:   04/11/19 2129 04/12/19 0526  BP: (!) 107/59 129/83  Pulse: 60 65  Resp: 16 16  Temp: 97.7 F (36.5 C) 98.1 F (36.7 C)  SpO2: 96% 93%    General exam: No acute distress.  Looks chronically ill.  Poor historian. Respiratory system: Bilateral decreased breath sounds at bases, no wheezing Cardiovascular system: Currently rate controlled, S1-S2 heard Gastrointestinal system: Abdomen is nondistended, soft and nontender. Normal bowel sounds heard. Extremities: No cyanosis, edema    The results of significant diagnostics from this hospitalization (including imaging, microbiology, ancillary and laboratory) are listed below for reference.     Microbiology: Recent Results (from the past 240 hour(s))  SARS CORONAVIRUS 2 (TAT 6-24 HRS) Nasopharyngeal Nasopharyngeal Swab     Status: Abnormal   Collection Time: 04/03/19  8:43 PM   Specimen: Nasopharyngeal Swab  Result Value Ref Range Status   SARS Coronavirus 2 POSITIVE (A) NEGATIVE Final    Comment: RESULT CALLED TO, READ BACK BY AND VERIFIED  WITH: RN Alesia Banda AT [id number] 04/04/2019 BY L BENFIELD (NOTE) SARS-CoV-2 target nucleic acids are DETECTED. The SARS-CoV-2 RNA is generally detectable in upper and lower respiratory specimens during the acute phase of infection. Positive results are indicative of active infection with SARS-CoV-2. Clinical  correlation with patient history and other diagnostic information is necessary to determine patient infection status. Positive results do  not rule out bacterial infection or co-infection with other viruses. The expected result is Negative. Fact Sheet for Patients: SugarRoll.be Fact Sheet for Healthcare Providers: https://www.woods-mathews.com/ This test is not yet approved or cleared by the [place] FDA and  has been authorized for detection and/or diagnosis of SARS-CoV-2 by FDA under an Emergency Use Authorization (EUA). This EUA will remain  in effect (meaning this test can be [place] ed) for the duration of the COVID-19 declaration under Section 564(b)(1) of the Act, 21 U.S.C. section 360bbb-3(b)(1), unless the authorization is terminated or revoked sooner. Performed at [facility], [place] [address], [place], [place] [address]      Labs: BNP (last 3 results) Recent Labs    11/11/18 0926 03/17/19 0450 04/04/19 0240  BNP 26.6 119.6* 74.1   Basic Metabolic Panel: Recent Labs  Lab 04/06/19 0408 04/07/19 0634 04/08/19 0535 04/11/19 0224  NA 138 139 139 139  K 4.1 4.4 4.4 4.1  CL 103 102 101 105  CO2 24 26 29 28   GLUCOSE 106* 108* 105* 102*  BUN 38* 43* 41* 30*  CREATININE 0.90 0.96 1.10* 1.01*  CALCIUM 9.5 9.5 9.5 9.1  MG  --  2.1 2.0 2.0   Liver Function Tests: Recent Labs  Lab 04/06/19 0408 04/07/19 0634 04/08/19 0535 04/11/19 0224  AST 18 16 17 25   ALT 18 17 16 24   ALKPHOS 69 70 63 65  BILITOT 1.0 0.3 0.6 0.5  PROT 6.1* 5.9* 6.0* 5.8*  ALBUMIN 2.9* 2.8* 2.9* 2.8*   No results for input(s): LIPASE, AMYLASE  in the last 168 hours. No results for input(s): AMMONIA in the last 168 hours. CBC: Recent Labs  Lab 04/06/19 0408 04/07/19 2878 04/08/19 0535 04/11/19  0224  WBC 4.2 4.6 4.2 5.3  NEUTROABS  --   --  2.0  --   HGB 11.7* 11.4* 11.4* 10.6*  HCT 35.9* 35.2* 34.3* 32.6*  MCV 96.8 97.5 96.6 97.6  PLT 275 308 321 331   Cardiac Enzymes: No results for input(s): CKTOTAL, CKMB, CKMBINDEX, TROPONINI in the last 168 hours. BNP: Invalid input(s): POCBNP CBG: Recent Labs  Lab 04/07/19 1234 04/07/19 1846 04/07/19 2345 04/08/19 0523 04/08/19 1313  GLUCAP 145* 94 101* 99 102*   D-Dimer No results for input(s): DDIMER in the last 72 hours. Hgb A1c No results for input(s): HGBA1C in the last 72 hours. Lipid Profile No results for input(s): CHOL, HDL, LDLCALC, TRIG, CHOLHDL, LDLDIRECT in the last 72 hours. Thyroid function studies No results for input(s): TSH, T4TOTAL, T3FREE, THYROIDAB in the last 72 hours.  Invalid input(s): FREET3 Anemia work up No results for input(s): VITAMINB12, FOLATE, FERRITIN, TIBC, IRON, RETICCTPCT in the last 72 hours. Urinalysis    Component Value Date/Time   COLORURINE YELLOW 04/03/2019 1800   APPEARANCEUR CLOUDY (A) 04/03/2019 1800   LABSPEC 1.012 04/03/2019 1800   PHURINE 8.0 04/03/2019 1800   GLUCOSEU NEGATIVE 04/03/2019 1800   HGBUR NEGATIVE 04/03/2019 1800   BILIRUBINUR NEGATIVE 04/03/2019 1800   KETONESUR NEGATIVE 04/03/2019 1800   PROTEINUR NEGATIVE 04/03/2019 1800   NITRITE NEGATIVE 04/03/2019 1800   LEUKOCYTESUR NEGATIVE 04/03/2019 1800   Sepsis Labs Invalid input(s): PROCALCITONIN,  WBC,  LACTICIDVEN Microbiology Recent Results (from the past 240 hour(s))  SARS CORONAVIRUS 2 (TAT 6-24 HRS) Nasopharyngeal Nasopharyngeal Swab     Status: Abnormal   Collection Time: 04/03/19  8:43 PM   Specimen: Nasopharyngeal Swab  Result Value Ref Range Status   SARS Coronavirus 2 POSITIVE (A) NEGATIVE Final    Comment: RESULT CALLED TO, READ BACK  BY AND VERIFIED WITH: RN Alesia Banda AT 7017 04/04/2019 BY L BENFIELD (NOTE) SARS-CoV-2 target nucleic acids are DETECTED. The SARS-CoV-2 RNA is generally detectable in upper and lower respiratory specimens during the acute phase of infection. Positive results are indicative of active infection with SARS-CoV-2. Clinical  correlation with patient history and other diagnostic information is necessary to determine patient infection status. Positive results do  not rule out bacterial infection or co-infection with other viruses. The expected result is Negative. Fact Sheet for Patients: SugarRoll.be Fact Sheet for Healthcare Providers: https://www.woods-mathews.com/ This test is not yet approved or cleared by the [place] FDA and  has been authorized for detection and/or diagnosis of SARS-CoV-2 by FDA under an Emergency Use Authorization (EUA). This EUA will remain  in effect (meaning this test can be [place] ed) for the duration of the COVID-19 declaration under Section 564(b)(1) of the Act, 21 U.S.C. section 360bbb-3(b)(1), unless the authorization is terminated or revoked sooner. Performed at [facility], [place] [address], [place], [place] [address]      Time coordinating discharge: 35 minutes  SIGNED:   Aline August, MD  [facility] 04/12/2019, 10:10 AM

## 2019-04-12 NOTE — TOC Transition Note (Signed)
Transition of Care [facility]) - CM/SW Discharge Note   Patient Details  Name: Kadasia Kassing MRN: [id number] Date of Birth: [birth year]  [facility]) CM/SW Contact:  Benard Halsted, LCSW Phone Number: 04/12/2019, 10:15 AM   Clinical Narrative:    CSW confirmed discharge with patient' spouse and made [facility] aware of patient's discharge (PT/OT/aide). No other needs identified at this time.      Barriers to Discharge: No Barriers Identified   Patient Goals and CMS Choice Patient states their goals for this hospitalization and ongoing recovery are:: Spouse hoping for rehab placement CMS Medicare.gov Compare Post Acute Care list provided to:: Patient Represenative (must comment)(Spouse) Choice offered to / list presented to : Spouse  Discharge Placement                Patient to be transferred to facility by: Car Name of family member notified: Spouse Patient and family notified of of transfer: 04/12/19  Discharge Plan and Services In-house Referral: Clinical Social Work   Post Acute Care Choice: [place]          DME Arranged: N/A         HH Arranged: NA [place] Agency: [facility] Date [place] Agency Contacted: 04/12/19 Time Branch: [id number] Representative spoke with at [place]: [place] ([place]) Interventions     Readmission Risk Interventions No flowsheet data found.

## 2019-04-12 NOTE — TOC Progression Note (Signed)
Transition of Care [facility]) - Progression Note    Patient Details  Name: Debra Morgan MRN: [id number] Date of Birth: 22-Apr-1946  Transition of Care [facility]) CM/SW [place], LCSW Phone Number: 04/12/2019, 9:06 AM  Clinical Narrative:    CSW received call from patient's spouse stating patient is refusing to go to SNF and he cannot make her agree to go. He is requesting resumption of home health services. He asked to switch to [place], however, [facility] unable to accept patient. Patient will resume services with [facility].    Expected Discharge Plan: [place] Barriers to Discharge: Continued Medical Work up  Expected Discharge Plan and Services Expected Discharge Plan: [place] In-house Referral: Clinical Social Work   Post Acute Care Choice: [place] Living arrangements for the past 2 months: Single Family Home                 DME Arranged: N/A         HH Arranged: NA           Social Determinants of Health (SDOH) Interventions    Readmission Risk Interventions No flowsheet data found.

## 2019-04-12 NOTE — Progress Notes (Signed)
Physical Therapy Treatment Patient Details Name: Debra Morgan MRN: [id number] DOB: [birth year] Today's Date: 04/12/2019    History of Present Illness 72 y.o. female with history of hypertension, hyperlipidemia, hypothyroidism, dystonia was recently admitted on March 07, 2019 last month for COVID-19 infection subsequent to which patient has become increasingly encephalopathic with difficulty walking with bursts of agitation.EEG showed nonspecific cortical dysfunction in left temporal region MRI brain showed no acute abnormality    PT Comments    Session focused on transfer training in order to simulate how patient performs stand pivots to transport chair at home. Pt able to perform multiple sit<>stands and x 2 stand pivot transfers with min-mod assist and increased time secondary to anxiousness with mobility, pt requiring ecouragement/reassurance for all transfers and mobility. Pt able to perform transfers with assist from therapist and pt also reports that this is how her husband helps her transfer at home. Pt continues to benefit from home health follow up therapies in order to assess transfers/mobility within the home environment and maximize functional independence with mobility.     Follow Up Recommendations  Home health PT;Supervision/Assistance - 24 hour     Equipment Recommendations  None recommended by PT    Recommendations for Other Services       Precautions / Restrictions Precautions Precautions: Fall;Other (comment) Precaution Comments: h/o falls due to dystonia and R LE locking up Restrictions Weight Bearing Restrictions: No    Mobility  Bed Mobility Overal bed mobility: Modified Independent             General bed mobility comments: increased time with HOB elevated  Transfers Overall transfer level: Needs assistance Equipment used: None Transfers: Sit to/from [employer] Sit to Stand: Mod assist;Min assist Stand pivot transfers: Mod  assist       General transfer comment: pt fluctuates between min-mod assist for sit<>stands based on anxiety level, pt continues to report throughout "I feel like I am falling," therapist providing reassurance and encouragement throughout. Pt able to perform x 5 sit<>stands throughout the session, no AD use pt either holding onto bedrail, or chair armrest. Pt performed x 2 stand pivot this session with increased time from her bed<>transport chair, cues for hand placement and foot placement.       Balance Overall balance assessment: Needs assistance Sitting-balance support: Feet supported;No upper extremity supported Sitting balance-Leahy Scale: Good Sitting balance - Comments: seated balance EOB WFL, sitting balance mod I while sitting in transport chair to brush teeth at the sink   Standing balance support: Bilateral upper extremity supported;During functional activity Standing balance-Leahy Scale: Poor Standing balance comment: min assist for standing balance, reliant on UE support, posterior lean.                            Cognition Arousal/Alertness: Awake/alert Behavior During Therapy: Anxious Overall Cognitive Status: No family/caregiver present to determine baseline cognitive functioning Area of Impairment: Awareness;Following commands                     Memory: Decreased short-term memory Following Commands: Follows one step commands inconsistently;Follows one step commands with increased time   Awareness: Emergent   General Comments: reports that she has dementia/Alz, and she frequently states that she cannot do things, becomes anxious when mentioning mobility OOB. Pt needing encouragement      Exercises      General Comments        Pertinent Vitals/Pain Pain Assessment:  No/denies pain    Home Living                      Prior Function            PT Goals (current goals can now be found in the care plan section) Progress  towards PT goals: Progressing toward goals    Frequency    Min 3X/week      PT Plan Current plan remains appropriate    Co-evaluation              AM-PAC PT "6 Clicks" Mobility   Outcome Measure  Help needed turning from your back to your side while in a flat bed without using bedrails?: None Help needed moving from lying on your back to sitting on the side of a flat bed without using bedrails?: None Help needed moving to and from a bed to a chair (including a wheelchair)?: A Lot Help needed standing up from a chair using your arms (e.g., wheelchair or bedside chair)?: A Lot Help needed to walk in hospital room?: A Lot Help needed climbing 3-5 steps with a railing? : Total 6 Click Score: 15    End of Session Equipment Utilized During Treatment: Gait belt Activity Tolerance: Patient tolerated treatment well;Other (comment)(self limiting) Patient left: in bed;with bed alarm set;with call bell/phone within reach Nurse Communication: Mobility status PT Visit Diagnosis: Muscle weakness (generalized) (M62.81);Difficulty in walking, not elsewhere classified (R26.2);History of falling (Z91.81)     Time: 1014-1040 PT Time Calculation (min) (ACUTE ONLY): 26 min  Charges:  $Therapeutic Activity: 23-37 mins                     Netta Corrigan, PT, DPT, CSRS Acute Rehab Office [place] 04/12/2019, 11:33 AM

## 2019-04-13 ENCOUNTER — Emergency Department (HOSPITAL_COMMUNITY)
Admission: EM | Admit: 2019-04-13 | Discharge: 2019-04-20 | Disposition: A | Discharge status: Home / Self Care | Payer: Medicare Other | Attending: Emergency Medicine | Admitting: Emergency Medicine

## 2019-04-13 ENCOUNTER — Other Ambulatory Visit: Payer: Self-pay

## 2019-04-13 ENCOUNTER — Encounter (HOSPITAL_COMMUNITY): Payer: Self-pay | Admitting: Emergency Medicine

## 2019-04-13 ENCOUNTER — Emergency Department (HOSPITAL_COMMUNITY): Payer: Medicare Other

## 2019-04-13 DIAGNOSIS — M25551 Pain in right hip: Secondary | ICD-10-CM

## 2019-04-13 DIAGNOSIS — S76319A Strain of muscle, fascia and tendon of the posterior muscle group at thigh level, unspecified thigh, initial encounter: Secondary | ICD-10-CM

## 2019-04-13 DIAGNOSIS — R102 Pelvic and perineal pain: Secondary | ICD-10-CM | POA: Insufficient documentation

## 2019-04-13 DIAGNOSIS — S76311A Strain of muscle, fascia and tendon of the posterior muscle group at thigh level, right thigh, initial encounter: Secondary | ICD-10-CM | POA: Diagnosis not present

## 2019-04-13 DIAGNOSIS — I10 Essential (primary) hypertension: Secondary | ICD-10-CM | POA: Diagnosis not present

## 2019-04-13 DIAGNOSIS — Z79899 Other long term (current) drug therapy: Secondary | ICD-10-CM | POA: Insufficient documentation

## 2019-04-13 DIAGNOSIS — Y999 Unspecified external cause status: Secondary | ICD-10-CM | POA: Insufficient documentation

## 2019-04-13 DIAGNOSIS — Z20828 Contact with and (suspected) exposure to other viral communicable diseases: Secondary | ICD-10-CM | POA: Insufficient documentation

## 2019-04-13 DIAGNOSIS — Y929 Unspecified place or not applicable: Secondary | ICD-10-CM | POA: Diagnosis not present

## 2019-04-13 DIAGNOSIS — E039 Hypothyroidism, unspecified: Secondary | ICD-10-CM | POA: Diagnosis not present

## 2019-04-13 DIAGNOSIS — Y939 Activity, unspecified: Secondary | ICD-10-CM | POA: Diagnosis not present

## 2019-04-13 DIAGNOSIS — X509XXA Other and unspecified overexertion or strenuous movements or postures, initial encounter: Secondary | ICD-10-CM | POA: Insufficient documentation

## 2019-04-13 DIAGNOSIS — S79911A Unspecified injury of right hip, initial encounter: Secondary | ICD-10-CM | POA: Diagnosis present

## 2019-04-13 IMAGING — DX DG HIP (WITH OR WITHOUT PELVIS) 2-3V*R*
3 series · 3 of 3 positions shown · non-contrast
Comparison: None.

CLINICAL DATA: Recent fall with right hip pain, initial encounter

EXAM:
DG HIP (WITH OR WITHOUT PELVIS) 3V RIGHT

[pelvis ap]
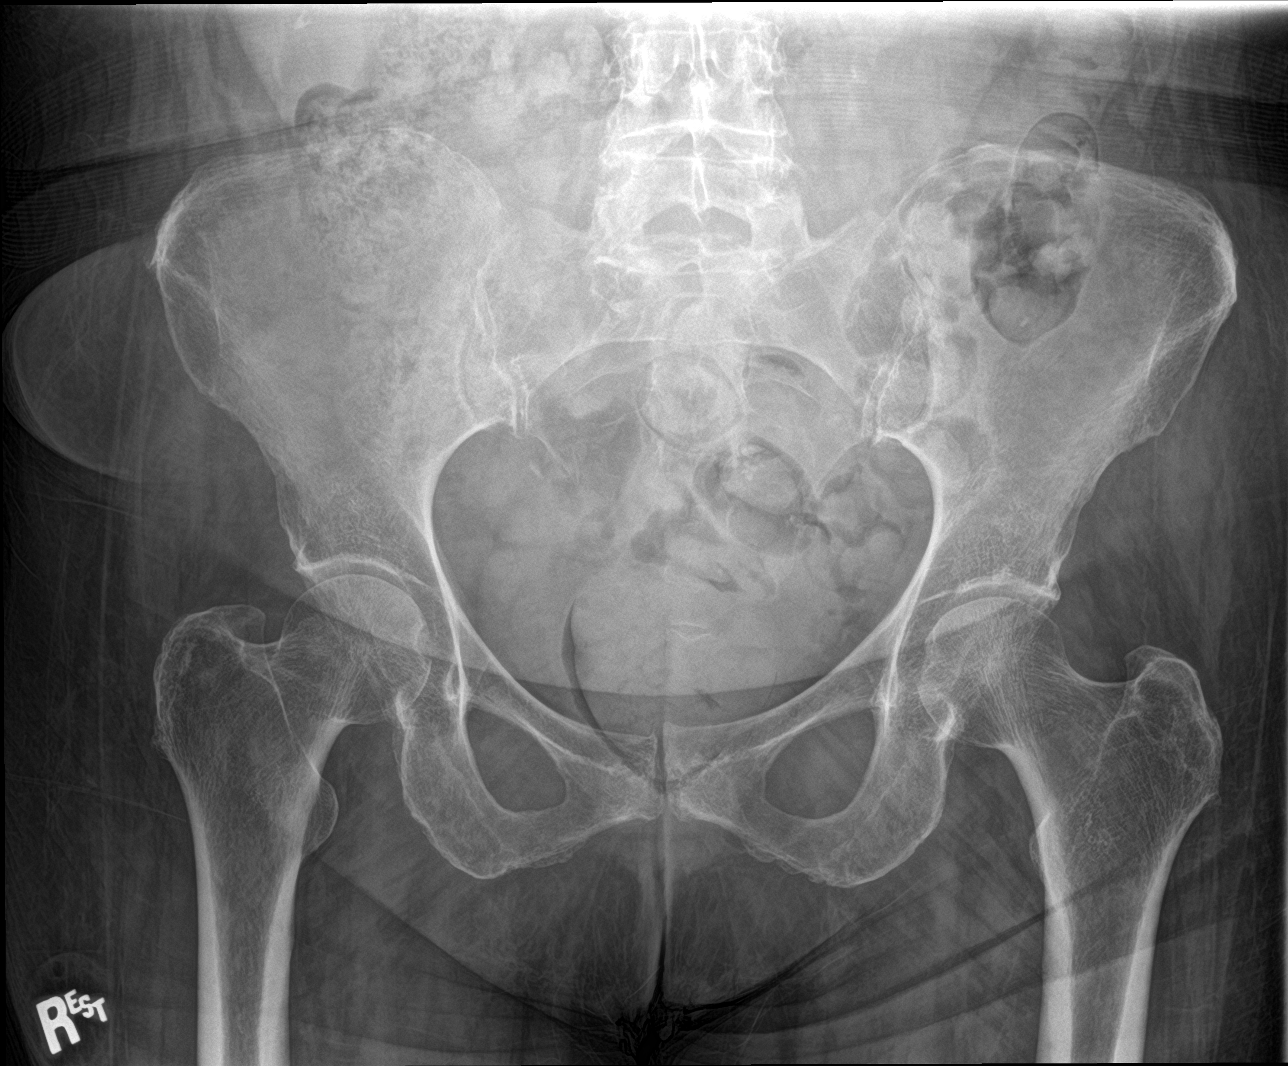

[hip ap]
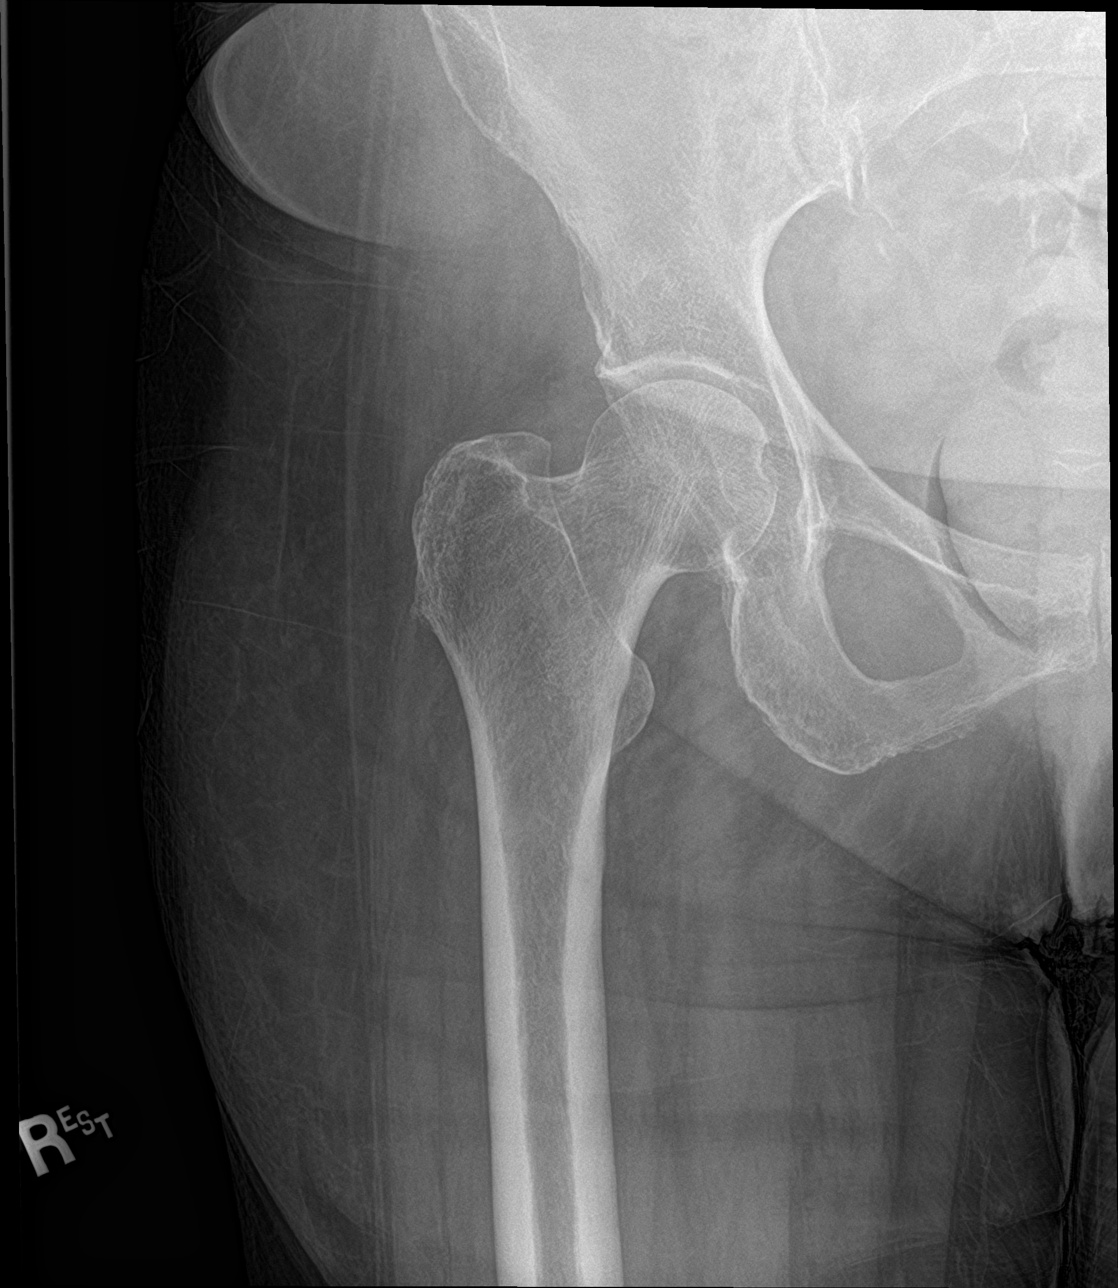

[hip lat]
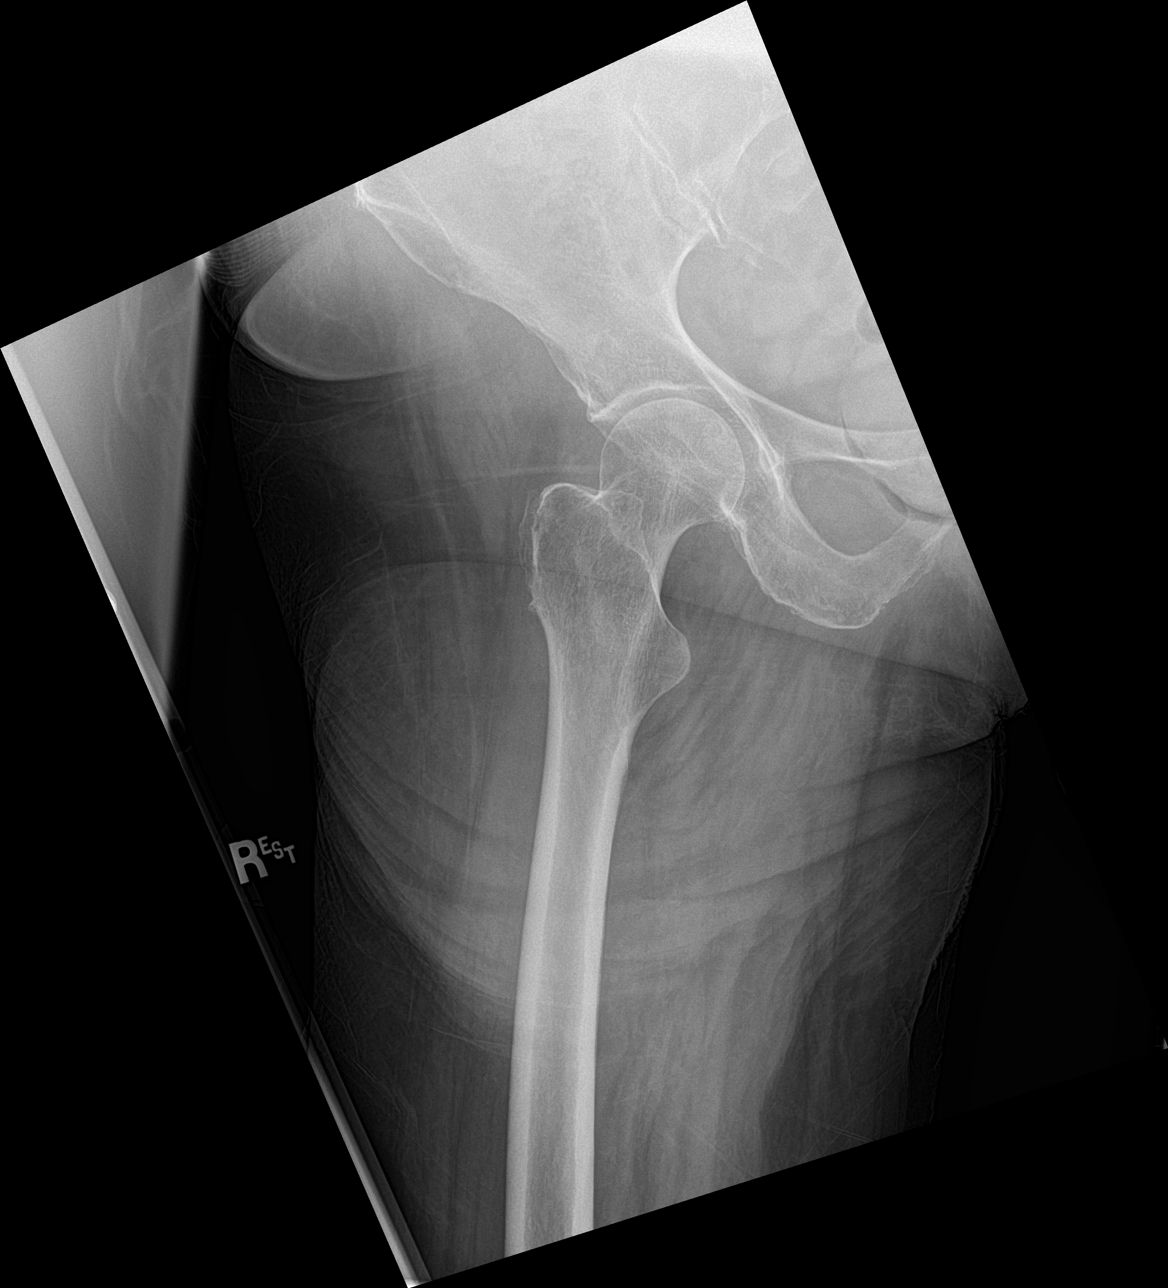

[3 of 3 positions shown; findings below may reference images not displayed]

FINDINGS: There is no evidence of hip fracture or dislocation. There is no
evidence of arthropathy or other focal bone abnormality.
IMPRESSION: No acute abnormality noted.

## 2019-04-13 NOTE — ED Triage Notes (Signed)
Patient arrived with EMS from home reports pain " felt a pop" at right hip this evening while attempting to transfer to her wheelchair , pain radiating to lower thigh.

## 2019-04-14 ENCOUNTER — Emergency Department (HOSPITAL_COMMUNITY): Payer: Medicare Other

## 2019-04-14 LAB — URINALYSIS, ROUTINE W REFLEX MICROSCOPIC
Bilirubin Urine: NEGATIVE
Glucose, UA: NEGATIVE mg/dL
Hgb urine dipstick: NEGATIVE
Ketones, ur: 5 mg/dL — AB
Nitrite: NEGATIVE
Protein, ur: NEGATIVE mg/dL
Specific Gravity, Urine: 1.031 — ABNORMAL HIGH (ref 1.005–1.030)
pH: 5 (ref 5.0–8.0)

## 2019-04-14 LAB — CBC WITH DIFFERENTIAL/PLATELET
Abs Immature Granulocytes: 0.02 10*3/uL (ref 0.00–0.07)
Basophils Absolute: 0.1 10*3/uL (ref 0.0–0.1)
Basophils Relative: 1 %
Eosinophils Absolute: 0.2 10*3/uL (ref 0.0–0.5)
Eosinophils Relative: 2 %
HCT: 38 % (ref 36.0–46.0)
Hemoglobin: 12.3 g/dL (ref 12.0–15.0)
Immature Granulocytes: 0 %
Lymphocytes Relative: 20 %
Lymphs Abs: 1.6 10*3/uL (ref 0.7–4.0)
MCH: 31.8 pg (ref 26.0–34.0)
MCHC: 32.4 g/dL (ref 30.0–36.0)
MCV: 98.2 fL (ref 80.0–100.0)
Monocytes Absolute: 0.6 10*3/uL (ref 0.1–1.0)
Monocytes Relative: 8 %
Neutro Abs: 5.4 10*3/uL (ref 1.7–7.7)
Neutrophils Relative %: 69 %
Platelets: 355 10*3/uL (ref 150–400)
RBC: 3.87 MIL/uL (ref 3.87–5.11)
RDW: 13.2 % (ref 11.5–15.5)
WBC: 7.8 10*3/uL (ref 4.0–10.5)
nRBC: 0 % (ref 0.0–0.2)

## 2019-04-14 LAB — COMPREHENSIVE METABOLIC PANEL
ALT: 28 U/L (ref 0–44)
AST: 23 U/L (ref 15–41)
Albumin: 3.5 g/dL (ref 3.5–5.0)
Alkaline Phosphatase: 88 U/L (ref 38–126)
Anion gap: 11 (ref 5–15)
BUN: 25 mg/dL — ABNORMAL HIGH (ref 8–23)
CO2: 24 mmol/L (ref 22–32)
Calcium: 9.2 mg/dL (ref 8.9–10.3)
Chloride: 105 mmol/L (ref 98–111)
Creatinine, Ser: 0.79 mg/dL (ref 0.44–1.00)
GFR calc Af Amer: >60 mL/min (ref >60)
GFR calc non Af Amer: >60 mL/min (ref >60)
Glucose, Bld: 107 mg/dL — ABNORMAL HIGH (ref 70–99)
Potassium: 3.4 mmol/L — ABNORMAL LOW (ref 3.5–5.1)
Sodium: 140 mmol/L (ref 135–145)
Total Bilirubin: 0.2 mg/dL — ABNORMAL LOW (ref 0.3–1.2)
Total Protein: 6.9 g/dL (ref 6.5–8.1)

## 2019-04-14 LAB — SARS CORONAVIRUS 2 (TAT 6-24 HRS): SARS Coronavirus 2: NEGATIVE

## 2019-04-14 IMAGING — MR MR PELVIS W/O CM
4 of 5 series · 31 of 48 positions shown · non-contrast
Comparison: CT pelvis [DATE]

CLINICAL DATA: Lower extremity dystonia and neuropathy. Recent
popping injury.

EXAM:
MRI PELVIS WITHOUT CONTRAST
TECHNIQUE: Multiplanar multisequence MR imaging of the pelvis was performed. No
intravenous contrast was administered.

[Series 8: T1 · axial · 4.0mm · 0.78mm/px · z∈[-108,+137]mm · 8 of 50 slices shown (1 of 2)]
[im 1/50]
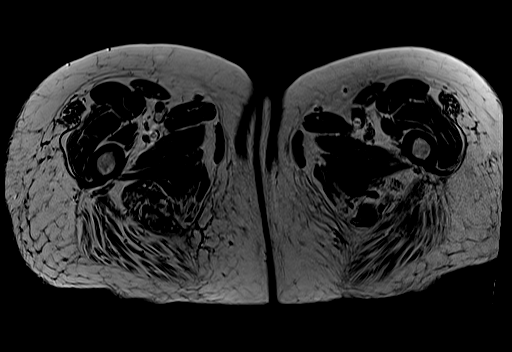
[im 6/50]
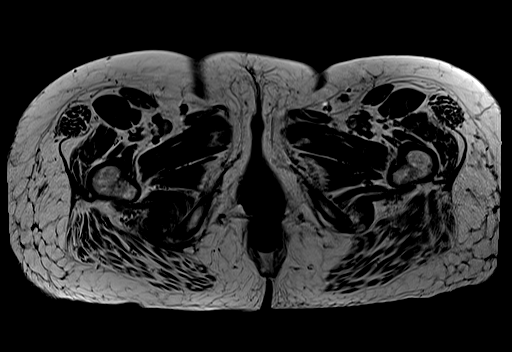
[im 17/50]
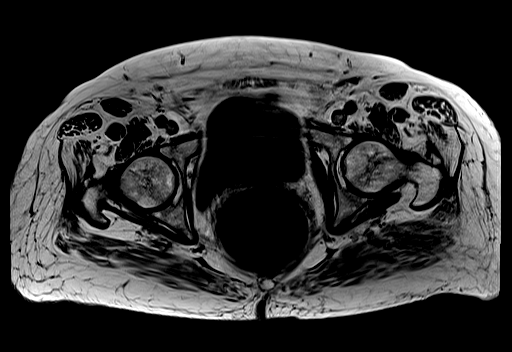
[im 22/50]
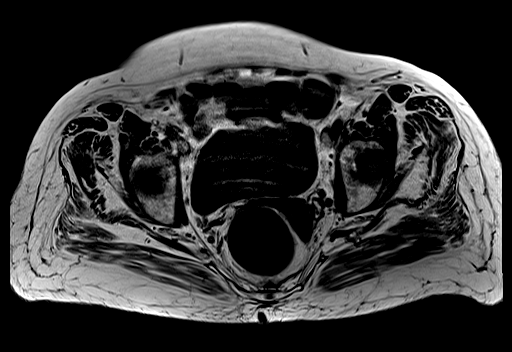
[im 28/50]
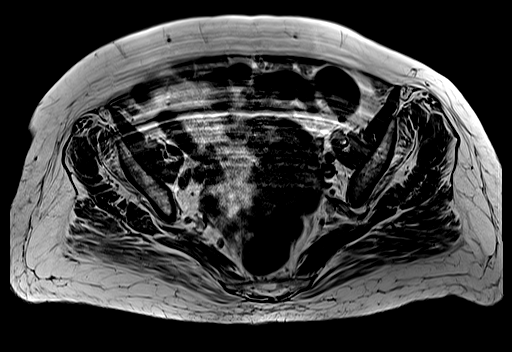
[im 33/50]
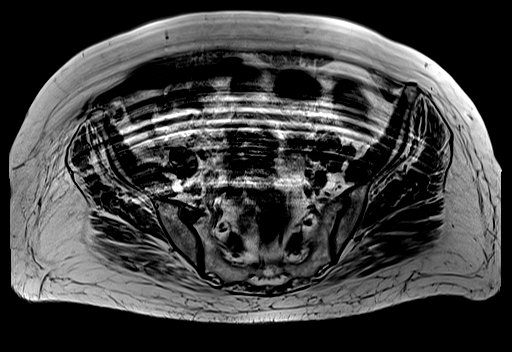
[im 44/50]
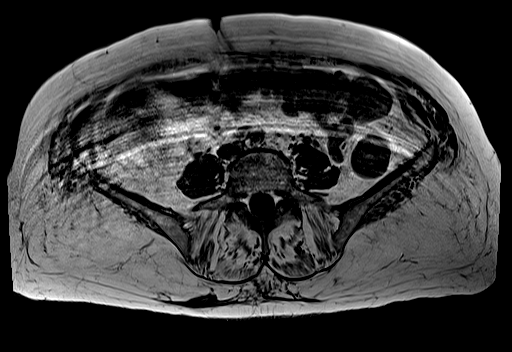
[im 50/50]
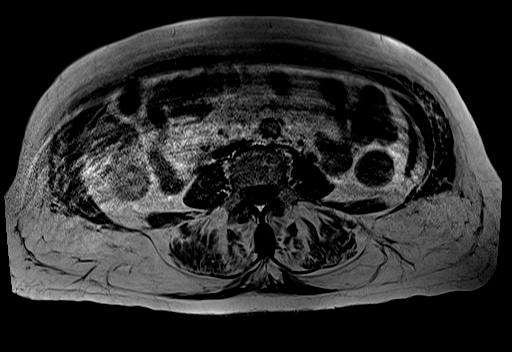

[Series 10: T1 · coronal · 4.0mm · 1.09mm/px · 8 of 44 slices shown (2 of 2)]
[im 1/44]
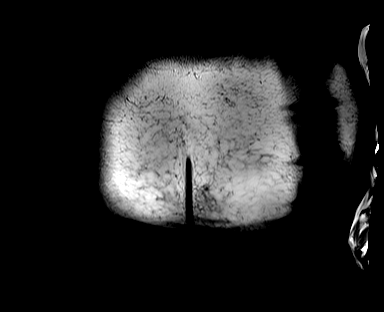
[im 7/44]
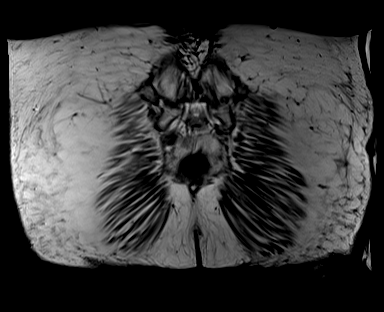
[im 13/44]
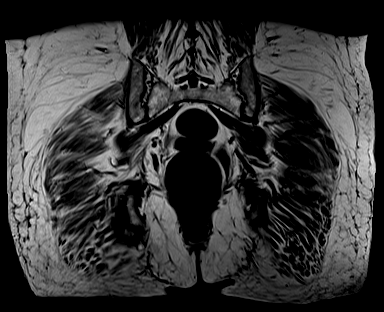
[im 19/44]
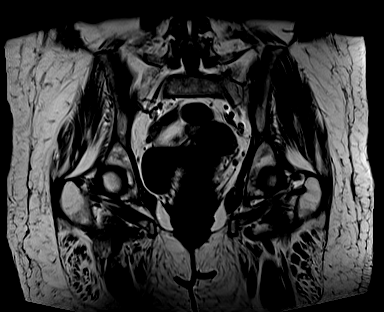
[im 25/44]
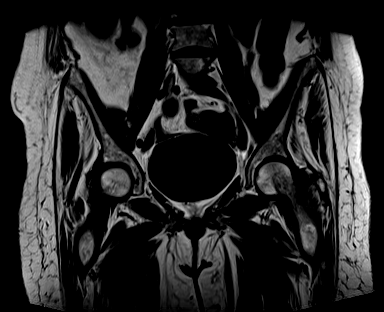
[im 31/44]
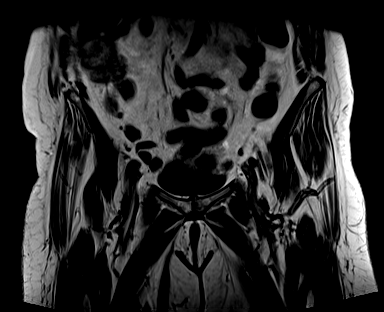
[im 37/44]
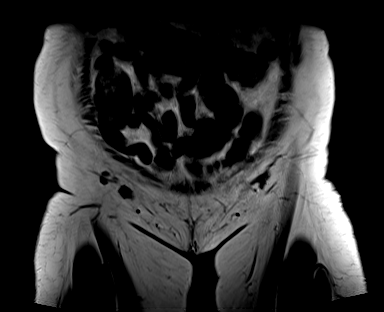
[im 44/44]
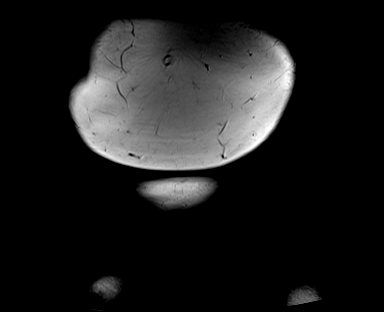

[Series 11: T2 fat-sat · axial · 4.0mm · 0.78mm/px · z∈[-108,+137]mm · 8 of 50 slices shown (1 of 2)]
[im 1/50]
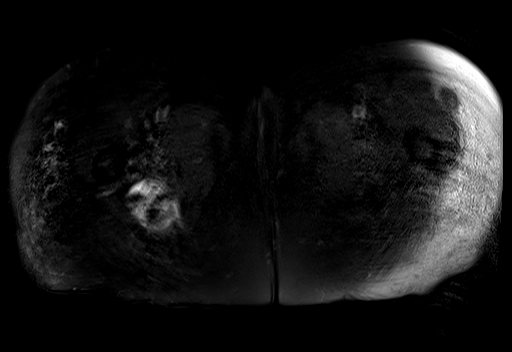
[im 6/50]
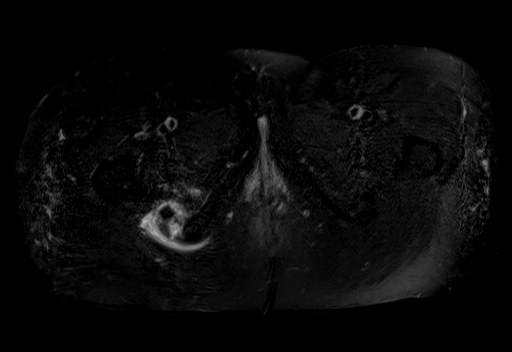
[im 17/50]
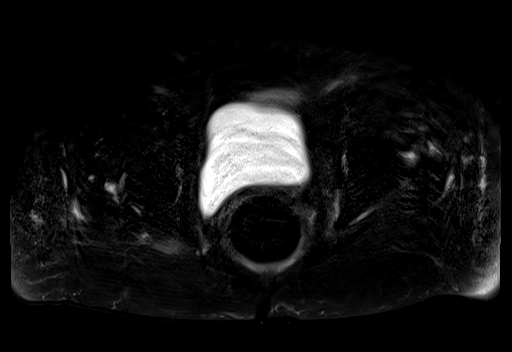
[im 22/50]
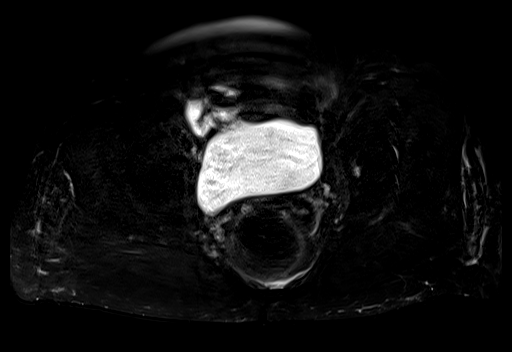
[im 28/50]
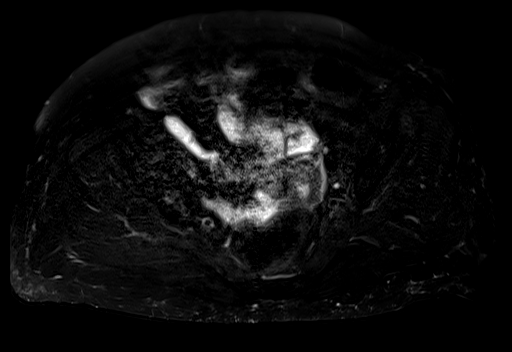
[im 33/50]
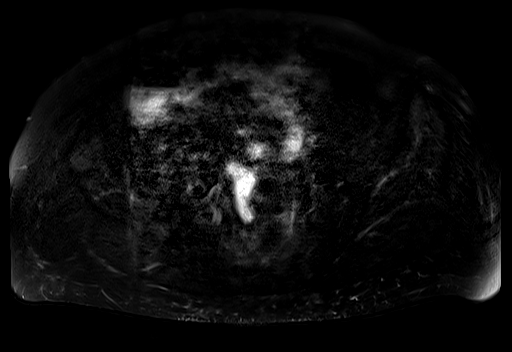
[im 44/50]
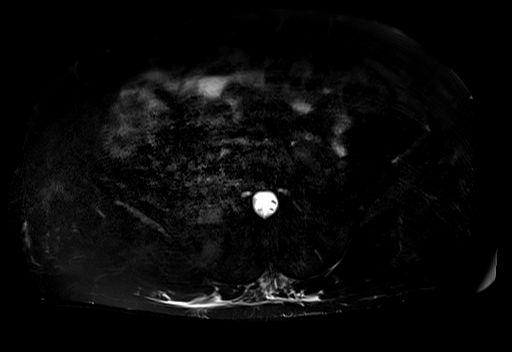
[im 50/50]
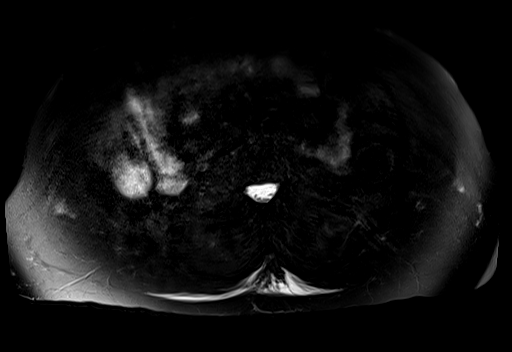

[Series 12: T2 fat-sat · sagittal · 4.0mm · 0.85mm/px · 7 of 64 slices shown (2 of 2)]
[im 1/64]
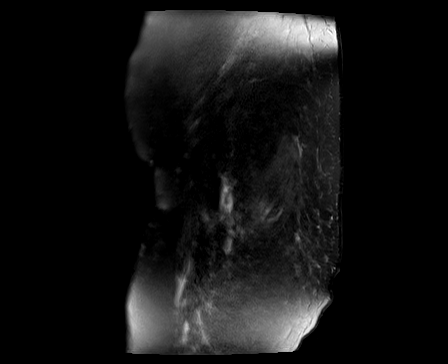
[im 12/64]
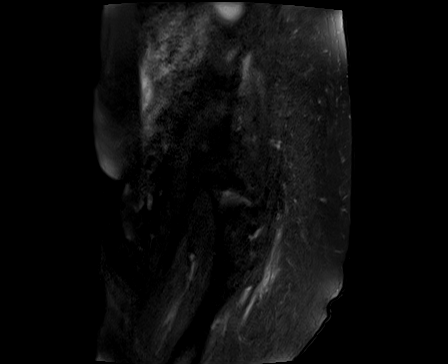
[im 18/64]
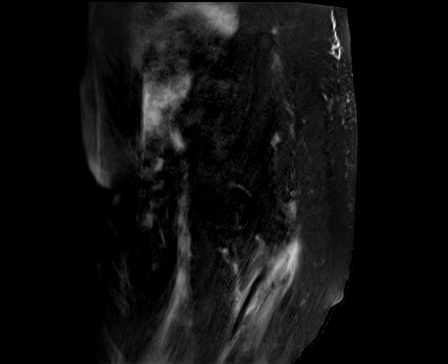
[im 29/64]
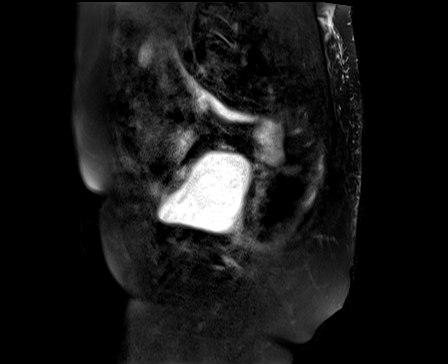
[im 35/64]
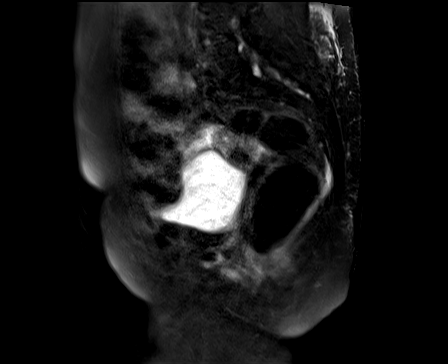
[im 46/64]
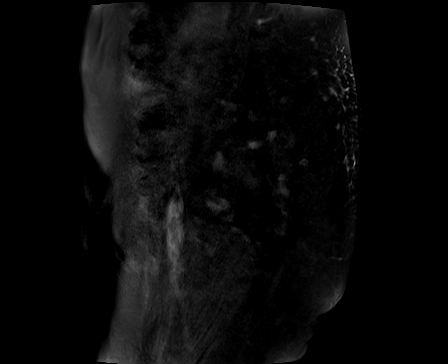
[im 58/64]
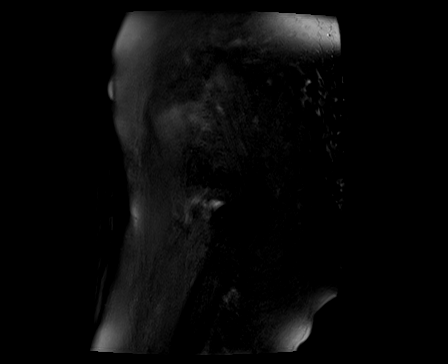

[31 of 48 positions shown; findings below may reference images not displayed]

FINDINGS: Despite efforts by the technologist and patient, motion artifact is
present on today's exam and could not be eliminated. This reduces
exam sensitivity and specificity.

Osseous structures: No appreciable fracture or osseous edema signal.

Musculotendinous: Torn right hamstring tendon, specifically
involving the long head of the biceps femoris and the semitendinosus
along the posteromedial portion of the ischial tuberosity, with a
fluid-filled gap of about 1.7 cm. Proximal semimembranosus tendon is
surrounded by fluid and may be partially torn but is not thought to
be completely ruptured proximally.

Symmetric generalized pelvic and upper thigh moderate muscular
atrophy.

Hip joints: No effusion.

Bursa: Trace bilateral trochanteric bursitis.

Other: Uterine fibroids. Mild presacral edema.
IMPRESSION: 1. Torn right hamstring tendon, particularly involving the long head
of the biceps femoris and semitendinosus components, with a
fluid-filled gap of about 1.7 cm.
2. Trace bilateral trochanteric bursitis.
3. Symmetric generalized pelvic and upper thigh muscular atrophy.
4. Uterine fibroids.
5. Mild presacral edema.

## 2019-04-14 IMAGING — CT CT PELVIS W/O CM
3 of 5 series · 15 of 36 positions shown, 17 images · non-contrast
Comparison: CT abdomen pelvis [DATE]

CLINICAL DATA: Right pelvic pain, felt pop while transferring to
wheelchair

EXAM:
CT PELVIS WITHOUT CONTRAST
TECHNIQUE: Multidetector CT imaging of the pelvis was performed following the
standard protocol without intravenous contrast.

[Series 3: soft tissue · axial · 0.76mm/px · z∈[+754,+1004]mm · 8 of 60 slices shown, 10 images]
[im 5/60  soft-tissue]
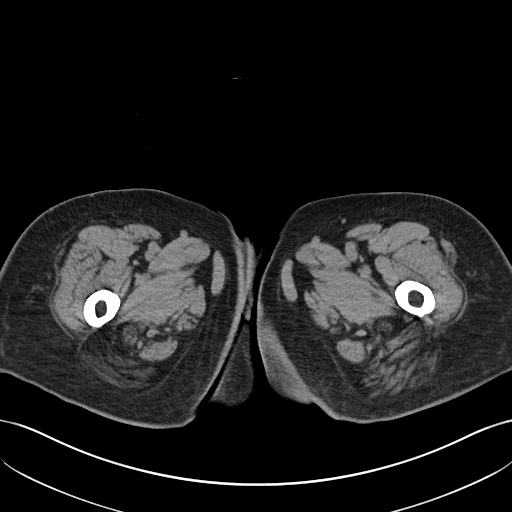
[im 5/60  bone]
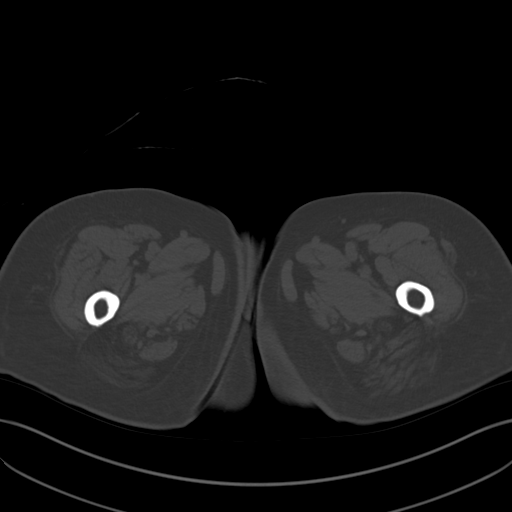
[im 14/60  bone]
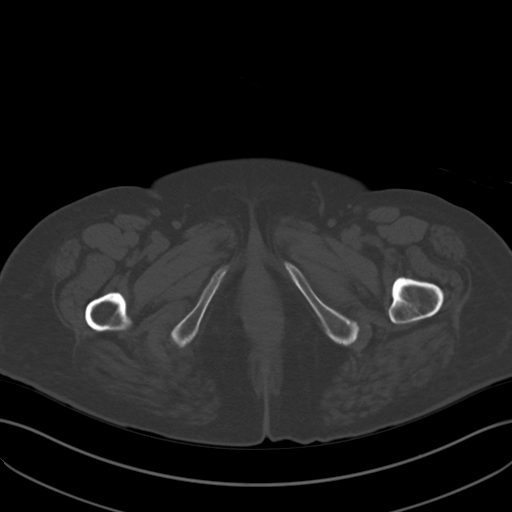
[im 19/60  bone]
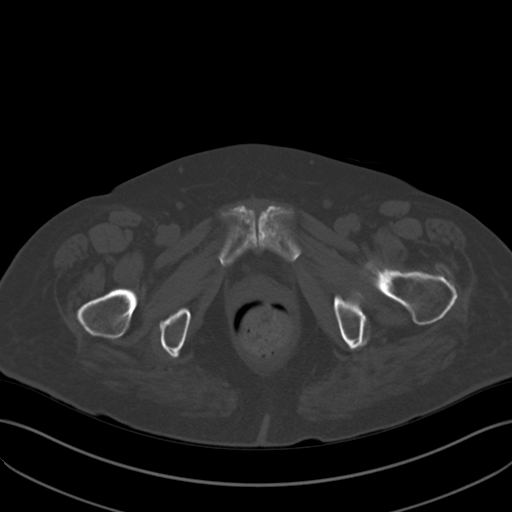
[im 28/60  bone]
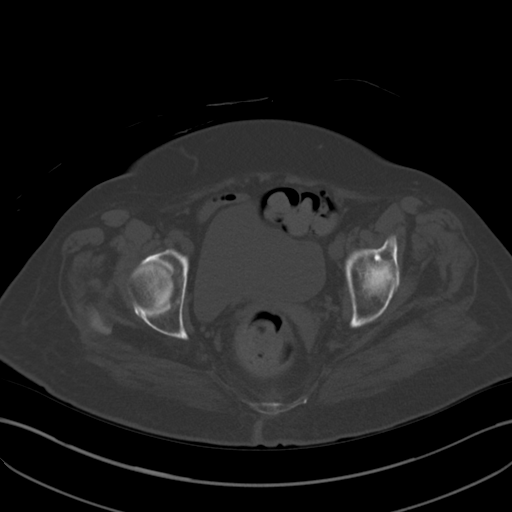
[im 32/60  soft-tissue]
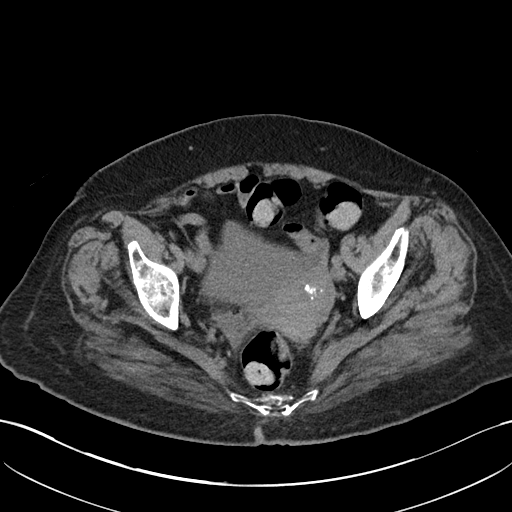
[im 32/60  bone]
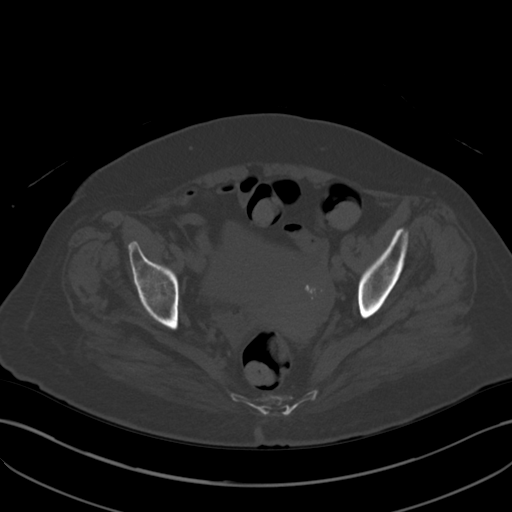
[im 41/60  bone]
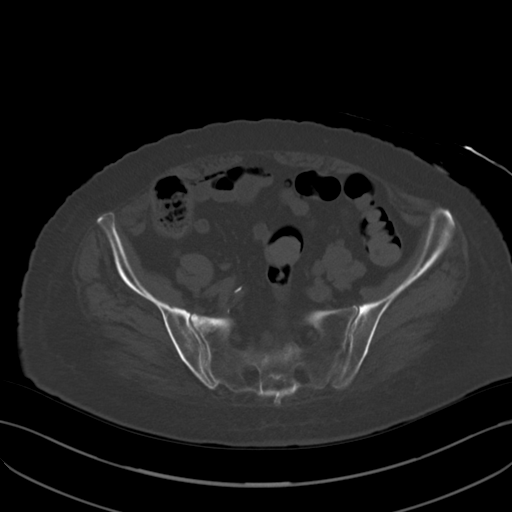
[im 46/60  bone]
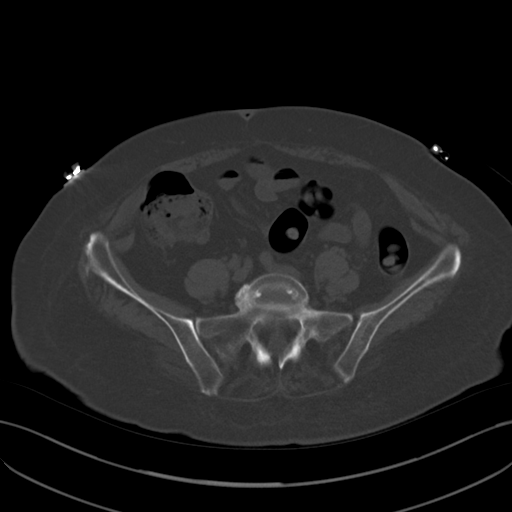
[im 55/60  bone]
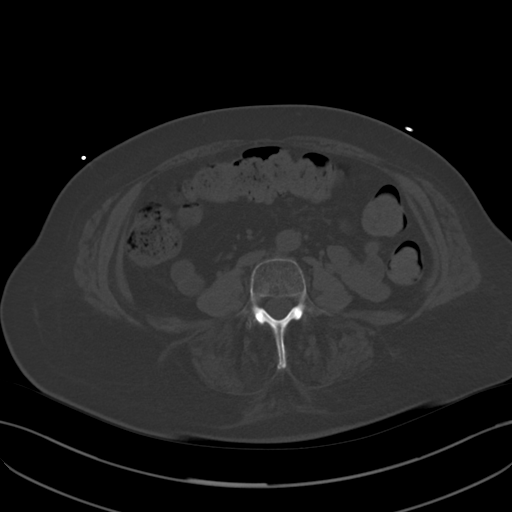

[Series 5: cor st · coronal · 0.63mm/px · 1 of 132 slices shown]
[im 66/132  bone]
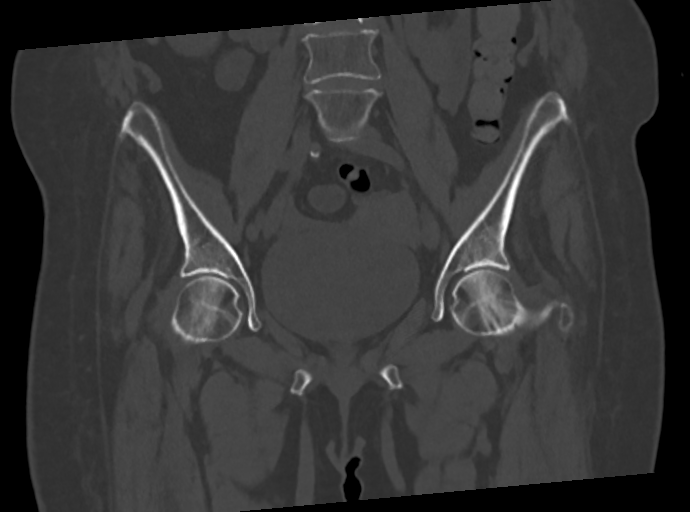

[Series 6: sag st · sagittal · 0.51mm/px · 6 of 216 slices shown]
[im 36/216  bone]
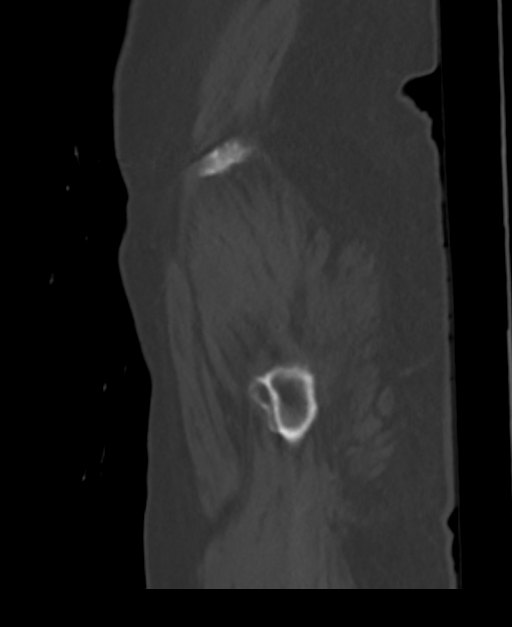
[im 72/216  bone]
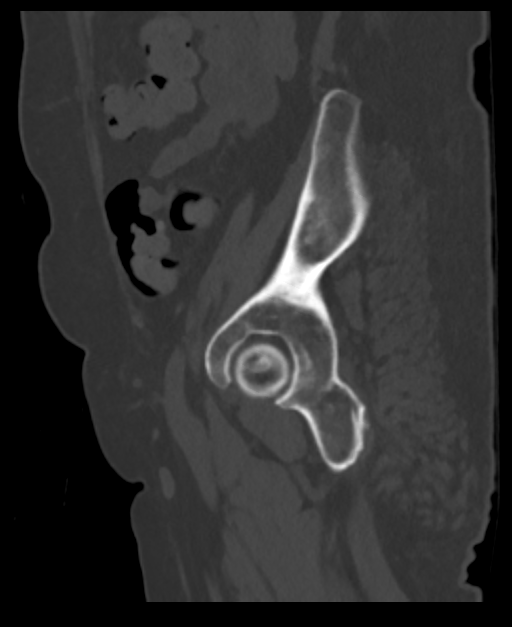
[im 82/216  soft-tissue]
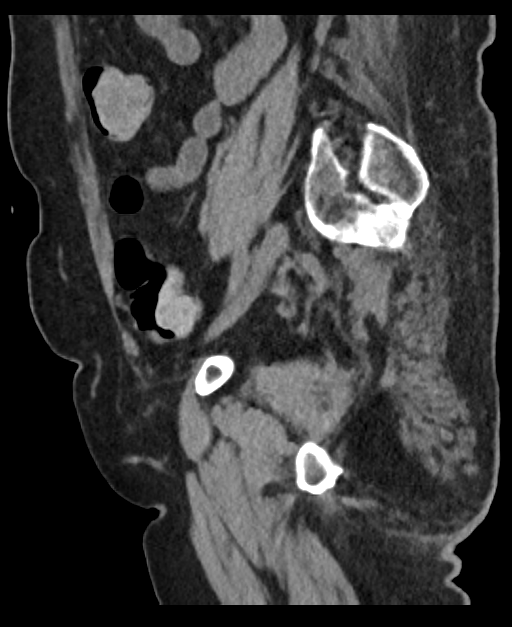
[im 108/216  bone]
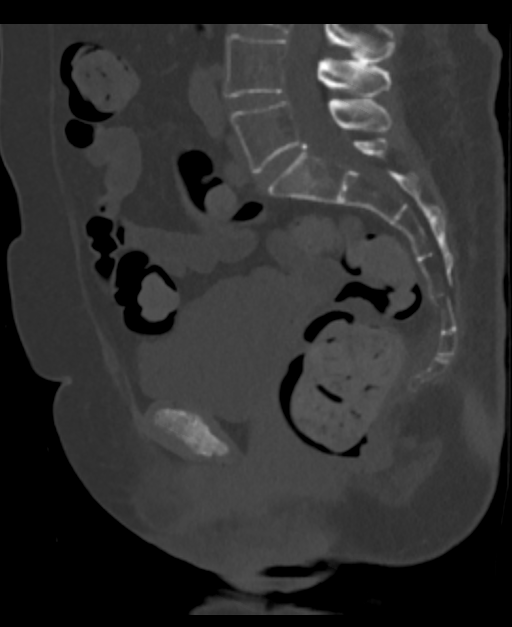
[im 144/216  bone]
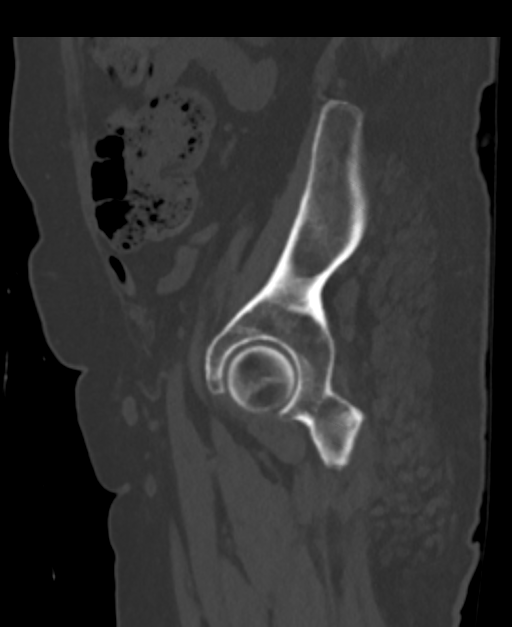
[im 180/216  bone]
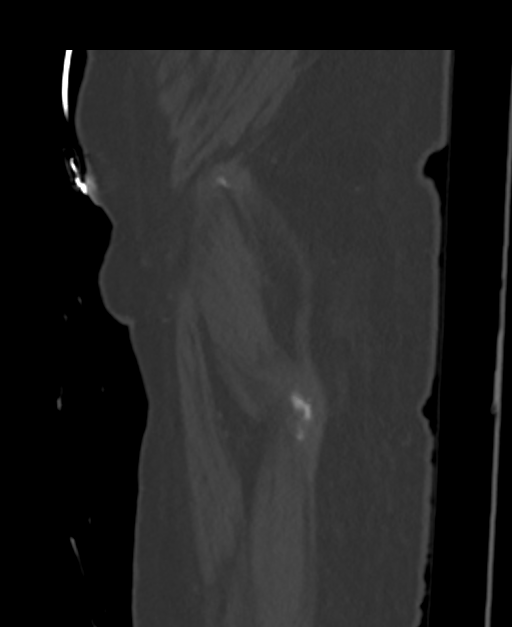

[15 of 36 positions shown; findings below may reference images not displayed]

FINDINGS: Urinary Tract: Lower pole right kidney, visualized course of the
ureters, and urinary bladder are unremarkable.

Bowel: No small or large bowel wall thickening or dilatation. A
normal appendix is visualized. No features of obstruction.

Vascular/Lymphatic: Atherosclerotic calcification of the distal
abdominal aorta, inflow vessels and proximal outflow vessels. No
acute vascular abnormality. No visualized pelvic adenopathy.

Reproductive: Multiple calcified uterine fibroids. No visible
endometrial thickening. No concerning adnexal lesions.

Other: No pelvic free fluid or free gas. No bowel containing
hernias. Mild body wall edema.

Musculoskeletal: No acute fracture or traumatic malalignment of the
pelvis. Sclerotic changes along the right ilium at the level of the
SI joint are similar to comparison. Additional degenerative changes
noted in the pubic symphysis. Mild bilateral hip arthrosis is seen
with a stable left acetabular bone island. No visible hip effusion.
Mild atrophy of the musculature is similar to prior. Dedicated
lumbar spine imaging was also performed. Please see that report for
findings in the lumbar spine.
IMPRESSION: 1. No acute fracture or traumatic malalignment of the pelvis.
2. Please see dedicated lumbar spine CT report for findings in the
lumbar spine.
3. Fibroid uterus.

Aortic Atherosclerosis ([7T]-[7T]).

## 2019-04-14 IMAGING — CT CT L SPINE W/O CM
3 of 5 series · 14 of 33 positions shown, 16 images · non-contrast
Comparison: CT abdomen pelvis [DATE], lumbar CT [DATE]

CLINICAL DATA: Felt pop while transferring to wheelchair, right hip
pain radiating to thigh.

EXAM:
CT LUMBAR SPINE WITHOUT CONTRAST
TECHNIQUE: Multidetector CT imaging of the lumbar spine was performed without
intravenous contrast administration. Multiplanar CT image
reconstructions were also generated.

[Series 5: l spine soft · axial · 0.42mm/px · z∈[+914,+1154]mm · 8 of 142 slices shown, 10 images]
[im 11/142  soft-tissue]
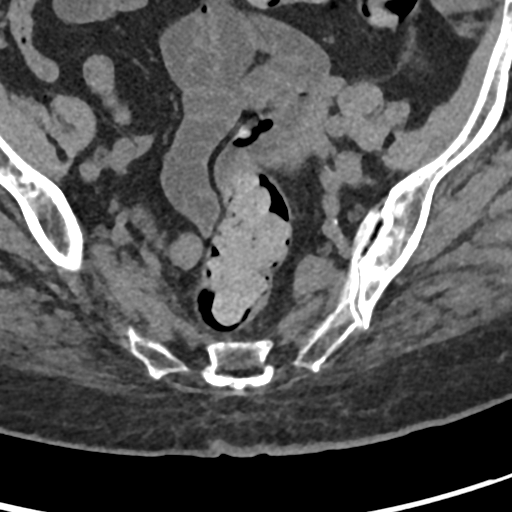
[im 11/142  bone]
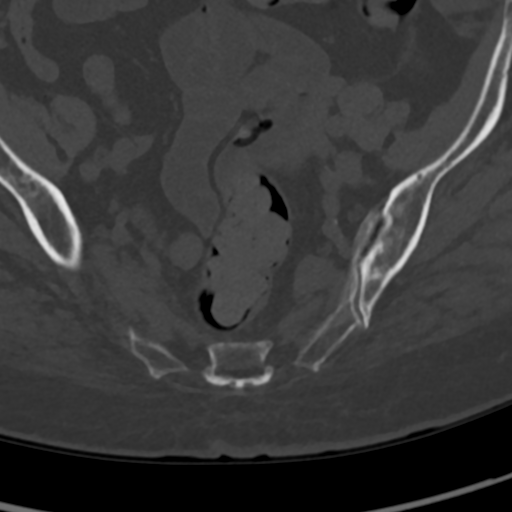
[im 33/142  bone]
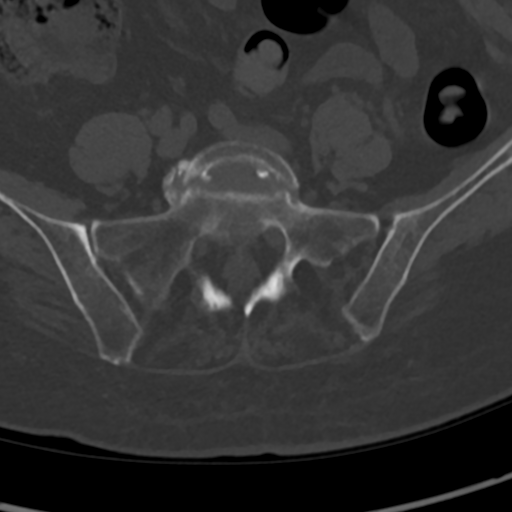
[im 44/142  bone]
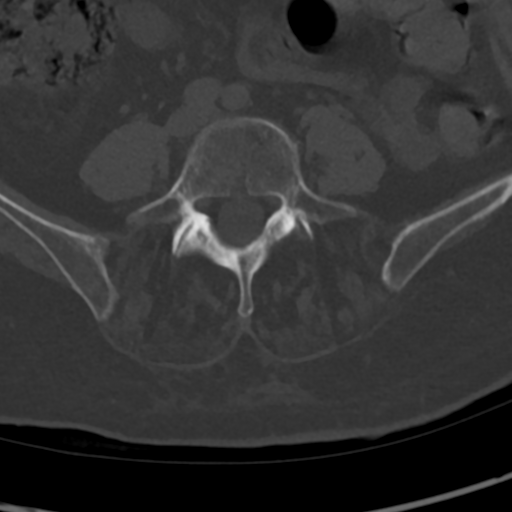
[im 66/142  bone]
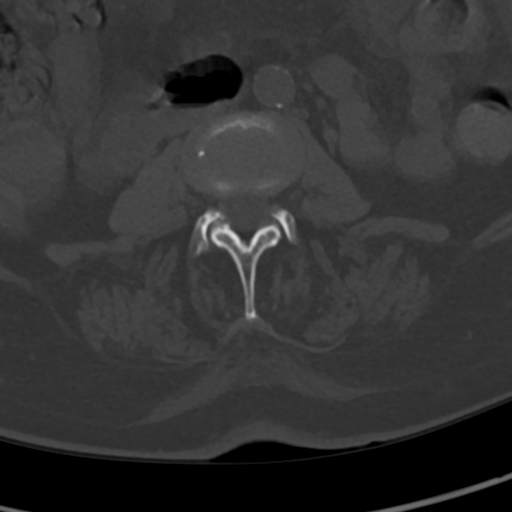
[im 76/142  soft-tissue]
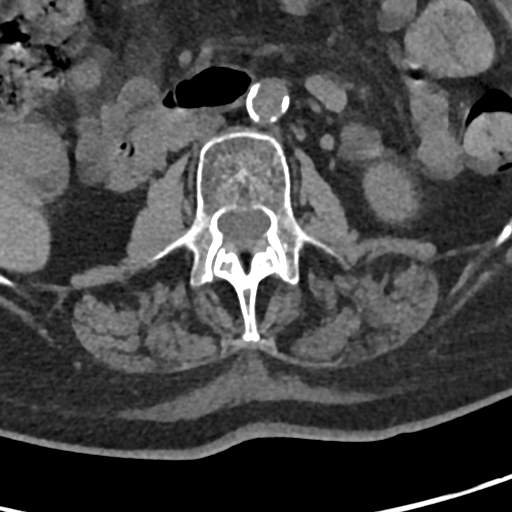
[im 76/142  bone]
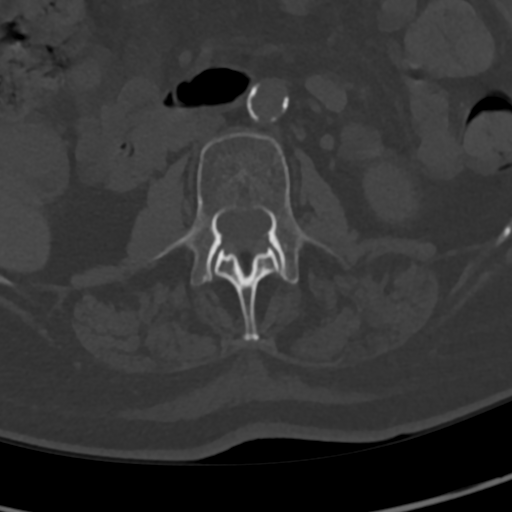
[im 98/142  bone]
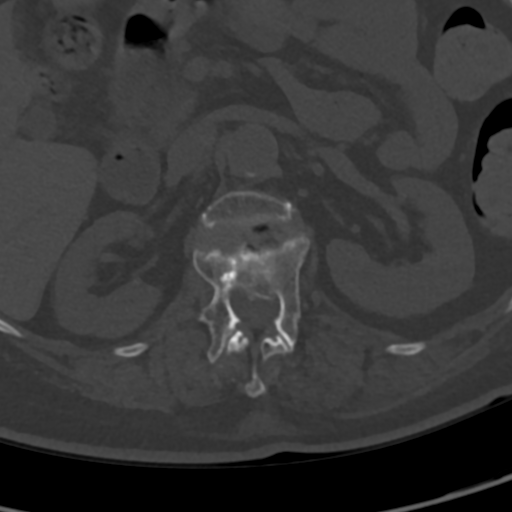
[im 109/142  bone]
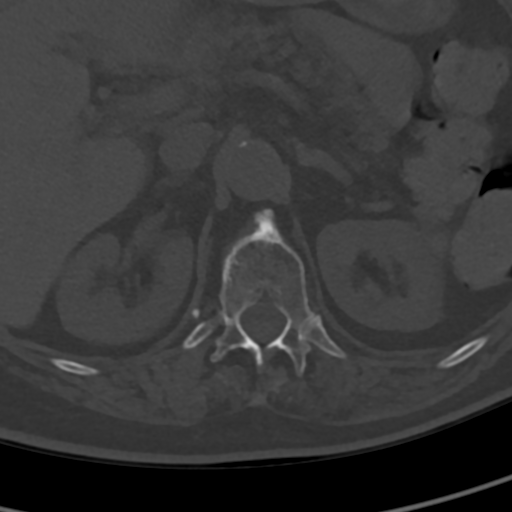
[im 131/142  bone]
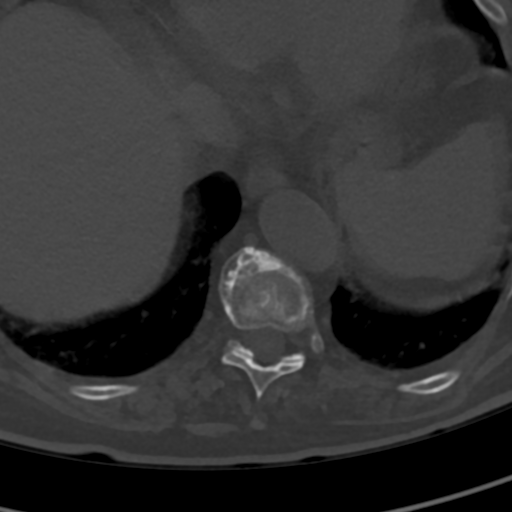

[Series 7: cor bone · coronal · 0.46mm/px · 1 of 95 slices shown]
[im 48/95  bone]
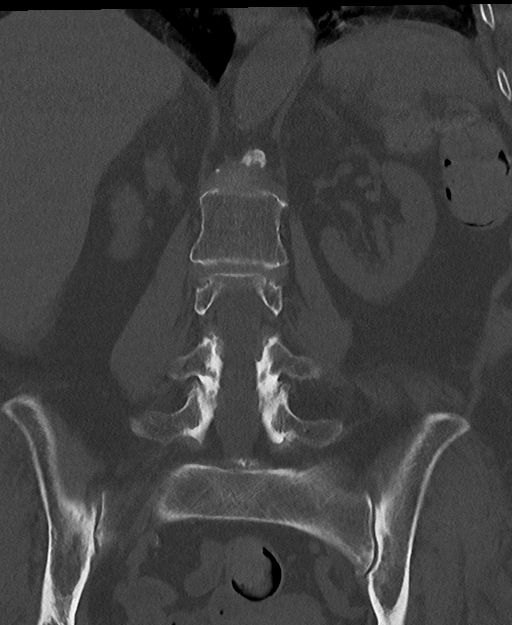

[Series 9: sag st · sagittal · 0.55mm/px · 5 of 100 slices shown]
[im 17/100  bone]
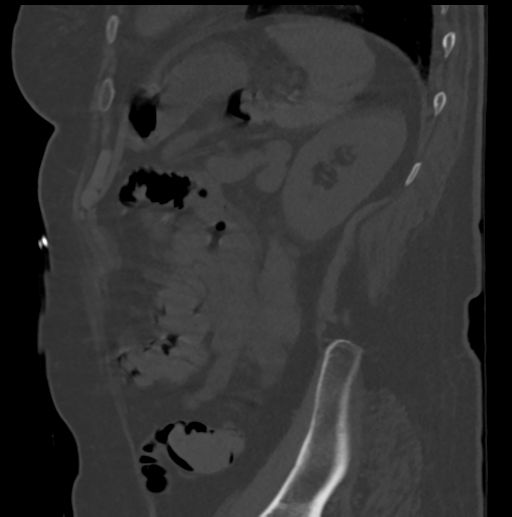
[im 34/100  bone]
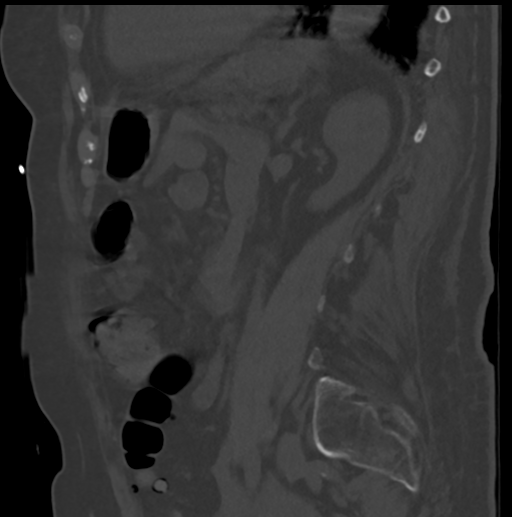
[im 50/100  bone]
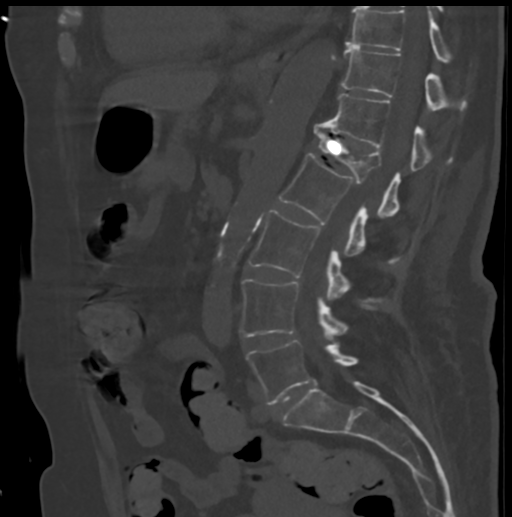
[im 67/100  bone]
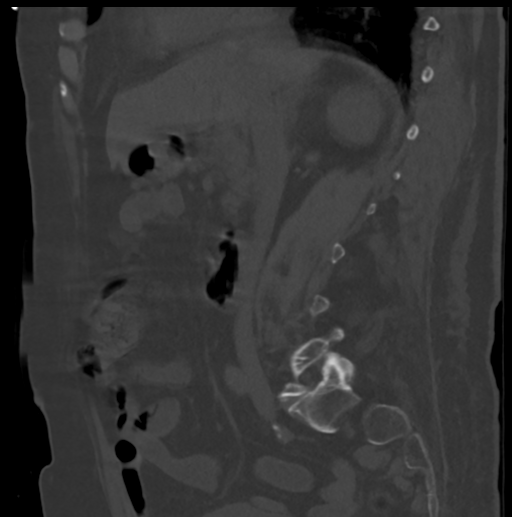
[im 83/100  bone]
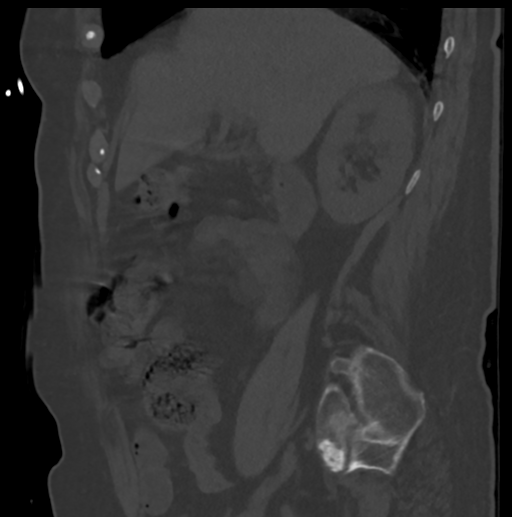

[14 of 33 positions shown; findings below may reference images not displayed]

FINDINGS: Segmentation: 5 lumbar type vertebrae.

Alignment: Kyphoplasty changes at L1 with retropulsion of the
vertebral body into the spinal canal of approximately 4 mm. Focal
kyphotic curvature at this level. Slight exaggeration of the lumbar
lordosis.

Vertebrae: Vertebral plana deformity of L1 with post augmentation
changes in the anterior vertebral body. Approximately 60% residual
height loss seen anteriorly. Stable mild anterior wedging of T12
similar to comparison and possibly physiologic. Interspinous
arthrosis noted from L2-L5.

Paraspinal and other soft tissues: No paravertebral fluid or
swelling. No visible canal hematoma. Paraspinal muscle atrophy is
similar to prior. Posterior soft tissues of the abdomen and pelvis
reveal aortoiliac atherosclerosis. A calcified fibroid uterus is
noted as well. Remaining soft tissues are unremarkable. Respiratory
motion noted in the lung bases.

Disc levels:

Level by level evaluation of the lumbar spine below:

T10-T11: Moderate disc height loss with anterior osteophytosis. No
significant posterior disc abnormality. Mild right neural foraminal
narrowing. No significant left neural foraminal narrowing or canal
stenosis.

T11-T12: Moderate disc height loss with anterior osteophytosis mild
right foraminal narrowing. Small amount of synovial thickening of
the facets. No significant spinal canal or foraminal narrowing.

T12-L1: Retropulsion of the posterior fragments of L1 resulting and
moderate canal stenosis. Near complete disc height loss with vacuum
phenomenon and disc desiccation. No significant neural foraminal
narrowing.

L1-L2: Near complete disc height loss. Retropulsion of the L1
inferior endplate fragment. Mild synovial thickening of the facets.
Findings result in mild canal stenosis and moderate bilateral
foraminal narrowing.

L2-L3: Mild disc height loss with global disc bulge. No significant
canal stenosis or foraminal narrowing.

L3-L4: Mild disc height loss and some ligamentum flavum infolding
with mild-to-moderate facet hypertrophic changes and synovial
thickening. Features result in no significant canal stenosis and
mild bilateral neural foraminal narrowing.

L4-L5: Mild global disc bulge moderate bilateral facet hypertrophic
degenerative change. No significant canal stenosis. Mild neural
foraminal narrowing bilaterally.

L5-S1: Moderate disc height loss with calcified global disc bulge
and left central disc protrusion. Moderate bilateral facet
hypertrophy. No significant canal stenosis. Mild right neural
foraminal narrowing.
IMPRESSION: 1. Kyphoplasty changes at L1 with 60% residual height loss and
retropulsion into the spinal canal resulting in moderate canal
stenosis.
2. Stable mild anterior wedging of T12, potentially
physiologic/developmental.
3. Multilevel degenerative changes of the lumbar spine, as detailed
above. At most moderate canal stenosis and foraminal narrowing.
4. Calcified fibroid uterus.
5. Aortic atherosclerosis.

Aortic Atherosclerosis ([BZ]-[BZ]).

## 2019-04-14 IMAGING — MR MR LUMBAR SPINE W/O CM
4 of 5 series · 26 of 48 positions shown · non-contrast
Comparison: Lumbar spine CT from [DATE]

CLINICAL DATA: Lower extremity dystonia. Right gluteal pain and
back pain while being transferred between wheelchair today.

EXAM:
MRI LUMBAR SPINE WITHOUT CONTRAST
TECHNIQUE: Multiplanar, multisequence MR imaging of the lumbar spine was
performed. No intravenous contrast was administered.

[Series 9: T2 · sagittal · 4.0mm · 0.73mm/px · 6 of 16 slices shown (1 of 2)]
[im 1/16]
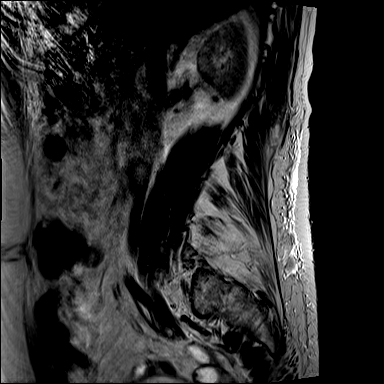
[im 4/16]
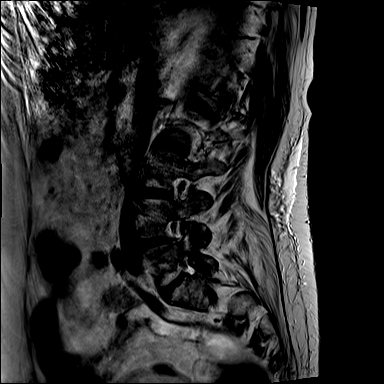
[im 7/16]
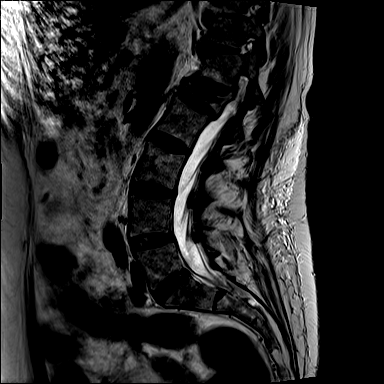
[im 10/16]
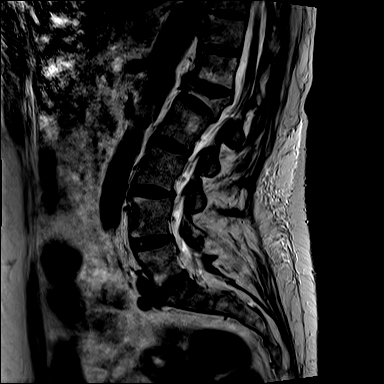
[im 13/16]
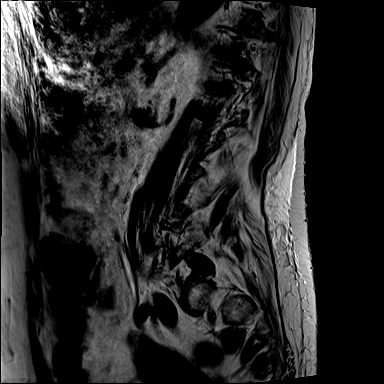
[im 16/16]
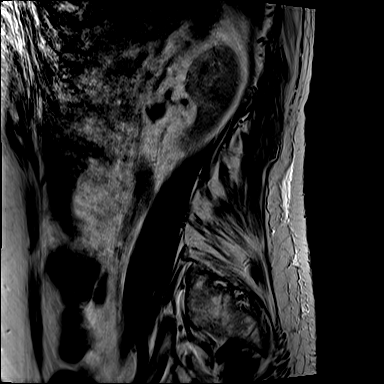

[Series 11: T1 · sagittal · 4.0mm · 0.88mm/px · 6 of 16 slices shown (1 of 2)]
[im 1/16]
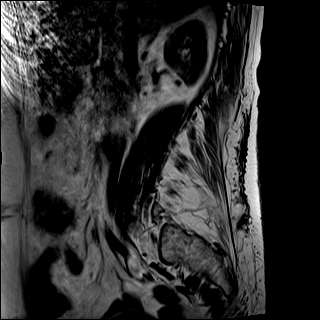
[im 4/16]
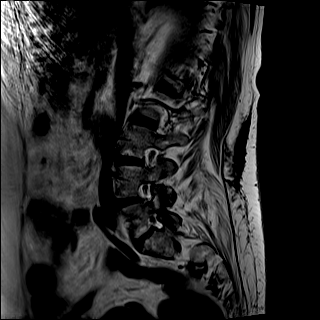
[im 7/16]
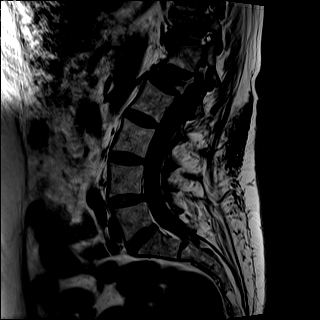
[im 10/16]
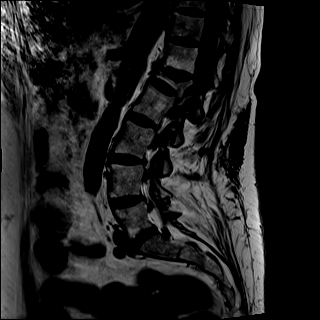
[im 13/16]
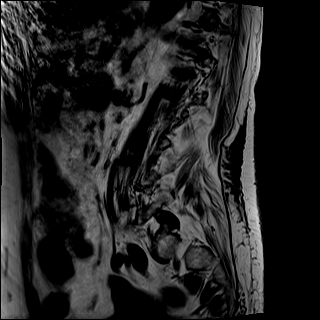
[im 16/16]
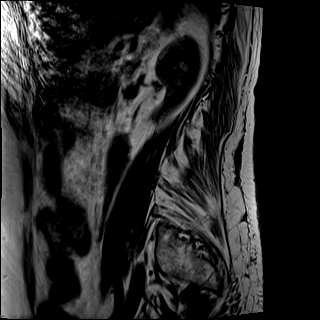

[Series 12: T2 · axial · 4.0mm · 0.57mm/px · z∈[+48,+285]mm · 9 of 37 slices shown (2 of 2)]
[im 1/37]
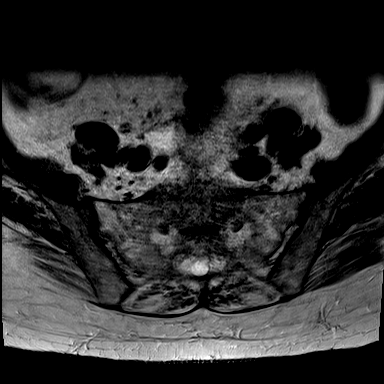
[im 6/37]
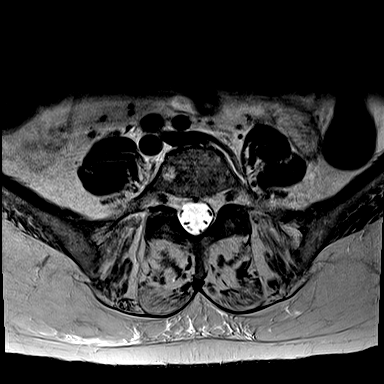
[im 11/37]
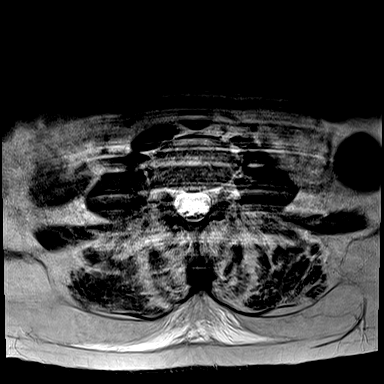
[im 16/37]
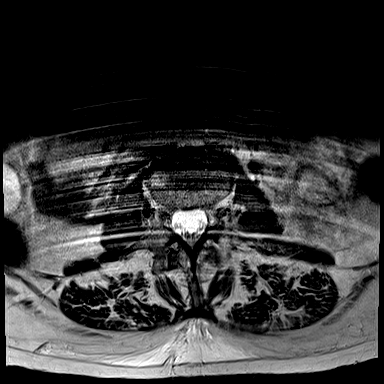
[im 19/37]
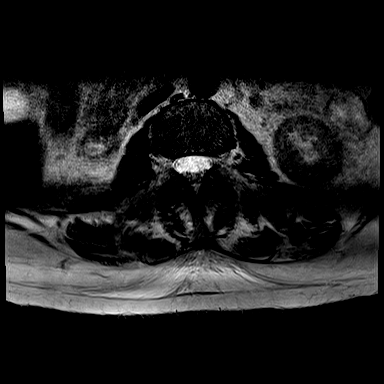
[im 21/37]
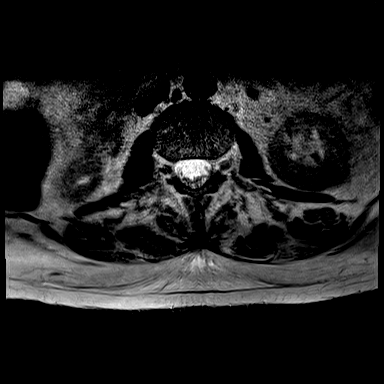
[im 26/37]
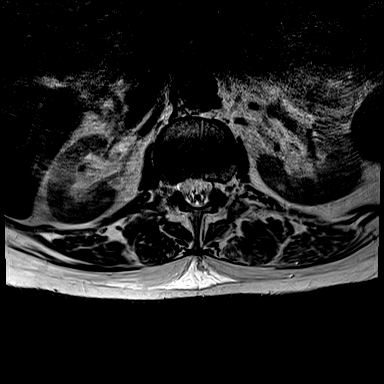
[im 31/37]
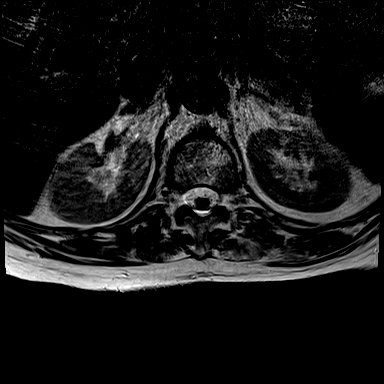
[im 37/37]
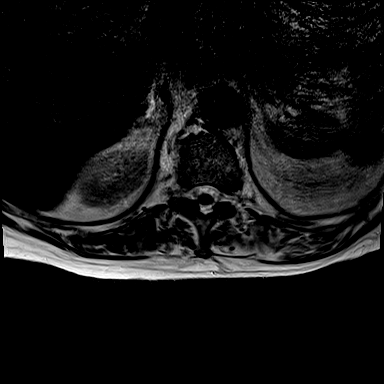

[Series 13: T1 · axial · 4.0mm · 0.34mm/px · z∈[+48,+256]mm · 5 of 37 slices shown (2 of 2)]
[im 1/37]
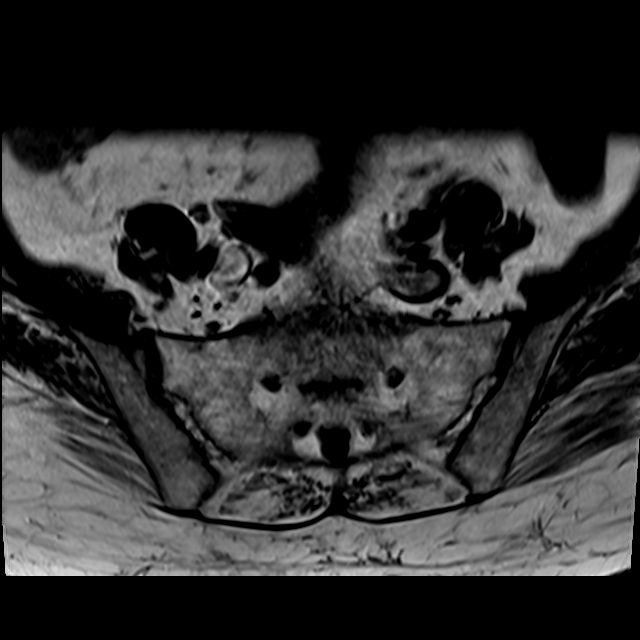
[im 6/37]
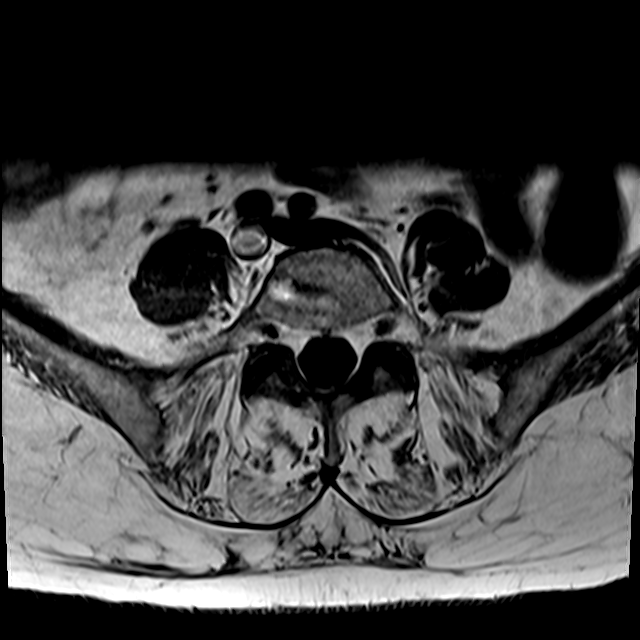
[im 11/37]
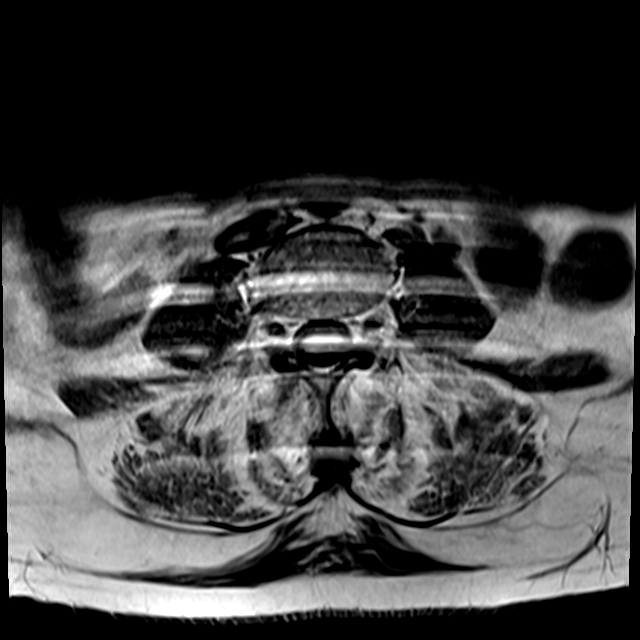
[im 19/37]
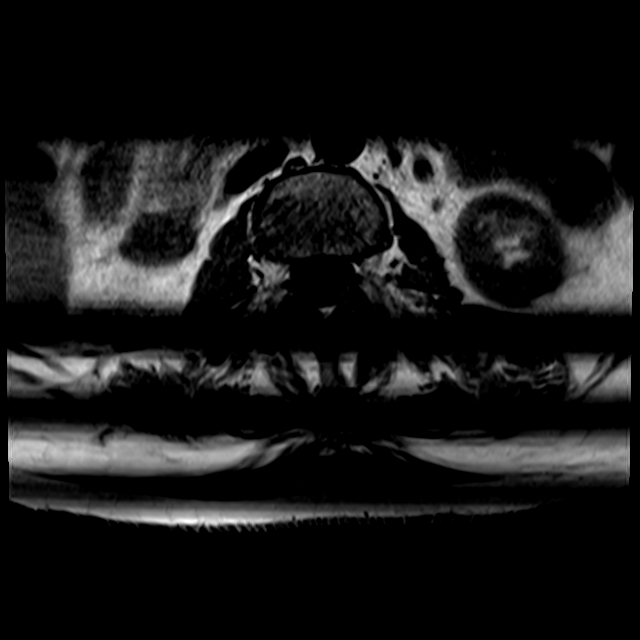
[im 31/37]
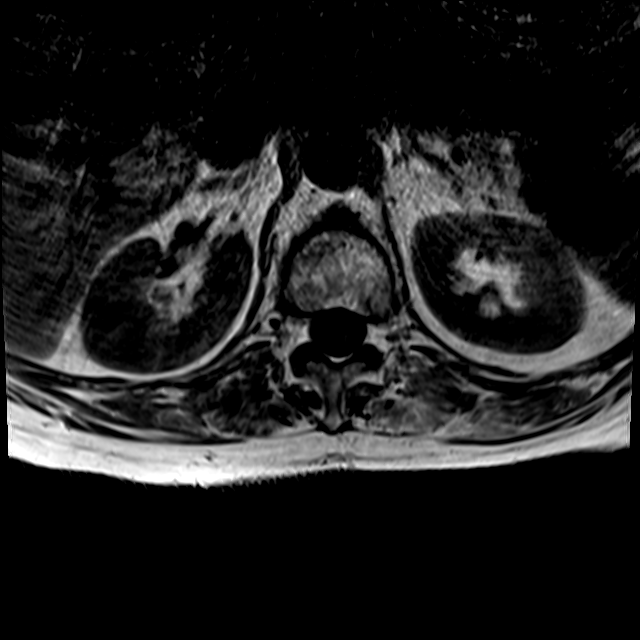

[26 of 48 positions shown; findings below may reference images not displayed]

FINDINGS: Despite efforts by the technologist and patient, motion artifact is
present on today's exam and could not be eliminated. This reduces
exam sensitivity and specificity.

Segmentation: The lowest lumbar type non-rib-bearing vertebra is
labeled as L5.

Alignment:  4 mm posterior bony retropulsion of L1 on L2, unchanged.

Vertebrae: 75% compression fracture of L1 with vertebral
augmentation. This is associated with up to 0.6 cm of posterior bony
retropulsion. Subtle edema signal in the posterosuperior vertebral
body but overall the degree of compression appears similar to
[DATE].

Conus medullaris and cauda equina: Conus extends to the L1-2 level.
Conus and cauda equina appear normal.

Paraspinal and other soft tissues: Unremarkable

Disc levels:

T12-L1: Borderline central narrowing of the thecal sac related to
the posterior bony retropulsion from L1 which is thought to be
chronic.

L1-2: No impingement. Diffuse disc bulge.

L2-3: Unremarkable.

L3-4: No impingement. Mild disc bulge.

L4-5: No impingement. Mild bilateral degenerative facet arthropathy.

L5-S1: No impingement. Right greater than left facet spurring.
IMPRESSION: 1. 75% chronic compression fracture of L1 with vertebral
augmentation ([DATE]) and up to 0.6 cm of posterior bony
retropulsion, but without overt impingement. There is some subtle
marrow edema signal posteriorly in the vertebral body which could be
an indicator of a mild subacute level of additional compression, but
likely not from today given the subtlety of the finding.
2. Lumbar spondylosis and degenerative disc disease, without
impingement.
3. Despite efforts by the technologist and patient, motion artifact
is present on today's exam and could not be eliminated. This reduces
exam sensitivity and specificity.

## 2019-04-14 MED ORDER — METHOCARBAMOL 500 MG PO TABS
500.0000 mg | ORAL_TABLET | Freq: Once | ORAL | Status: AC
  Administered 2019-04-14: 500 mg via ORAL
  Filled 2019-04-14: qty 1

## 2019-04-14 MED ORDER — BENZTROPINE MESYLATE 1 MG PO TABS
1.0000 mg | ORAL_TABLET | Freq: Two times a day (BID) | ORAL | Status: DC
  Administered 2019-04-14 – 2019-04-20 (×13): 1 mg via ORAL
  Filled 2019-04-14 (×13): qty 1

## 2019-04-14 MED ORDER — CARVEDILOL 12.5 MG PO TABS
6.2500 mg | ORAL_TABLET | Freq: Two times a day (BID) | ORAL | Status: DC
  Administered 2019-04-14 – 2019-04-20 (×13): 6.25 mg via ORAL
  Filled 2019-04-14 (×13): qty 1

## 2019-04-14 MED ORDER — AMOXICILLIN-POT CLAVULANATE 875-125 MG PO TABS
1.0000 | ORAL_TABLET | Freq: Two times a day (BID) | ORAL | Status: DC
  Administered 2019-04-14 – 2019-04-19 (×11): 1 via ORAL
  Filled 2019-04-14 (×11): qty 1

## 2019-04-14 MED ORDER — TRAMADOL HCL 50 MG PO TABS
50.0000 mg | ORAL_TABLET | Freq: Once | ORAL | Status: AC
  Administered 2019-04-14: 50 mg via ORAL
  Filled 2019-04-14: qty 1

## 2019-04-14 MED ORDER — LEVOTHYROXINE SODIUM 100 MCG PO TABS
100.0000 ug | ORAL_TABLET | Freq: Every day | ORAL | Status: DC
  Administered 2019-04-14 – 2019-04-20 (×7): 100 ug via ORAL
  Filled 2019-04-14 (×7): qty 1

## 2019-04-14 MED ORDER — TRAMADOL HCL 50 MG PO TABS: 50.0000 mg | ORAL_TABLET | Freq: Four times a day (QID) | ORAL | 0 refills | Status: AC | PRN

## 2019-04-14 MED ORDER — FENTANYL CITRATE (PF) 100 MCG/2ML IJ SOLN
50.0000 ug | Freq: Once | INTRAMUSCULAR | Status: AC
  Administered 2019-04-14: 50 ug via INTRAVENOUS
  Filled 2019-04-14: qty 2

## 2019-04-14 NOTE — ED Provider Notes (Addendum)
Assumed care of the patient at signout. With concern for her gluteal pain, hip pain, MRI was pending.9:18 AM I discussed these findings with her, including concern for hamstring disruption.  Patient confirms she is essentially wheelchair-bound.  She does have an orthopedist, will have referral to one of our local orthopedists as well.  Patient also notes that she is scheduled for home care assessment tomorrow.  She and I discussed rehab placement, and she states that she is a strong preference to avoid this if at all possible.  Patient will be discharged, with tramadol, which she notes she tolerates, has had no adverse reactions to, outpatient orthopedic, and home health follow-up.   10:33 AM Patient now amenable to SNF / rehab.  Husband advocated for this with her, and SW was able to assist with convincing the patient of this plan as well.  She is awaiting placement.   Carmin Muskrat, MD 04/14/19 1034

## 2019-04-14 NOTE — ED Provider Notes (Signed)
Emergency Department Provider Note   I have reviewed the triage vital signs and the nursing notes.   HISTORY  Chief Complaint Hip Pain   HPI Debra Morgan is a 73 y.o. 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  Recently admitted to the hospital for coronavirus encephalopathy however she was transferring between the wheelchair today she was pivoting on her left foot towards her left when she had an acute onset of sharp pain in her right gluteal area that radiated down the back of her right leg and also up into her right back.  Patient states that this makes it hard for her to transfer at this time.  Her sensations unchanged.  Ago she has decreased strength with flexion at the gluteal muscles.  She also states that she had decreased urine output for the last 12 hours.  She recalls that she has some lumbar spinal stenosis secondary to compressions.   No other associated or modifying symptoms.    Past Medical History:  Diagnosis Date  . Dystonia   . High cholesterol   . Hypertension   . Thyroid disease     Patient Active Problem List   Diagnosis Date Noted  . Visual hallucinations 04/05/2019  . Confusion 04/04/2019  . Encephalopathy acute 04/04/2019  . Generalized pain 04/04/2019  . Right leg weakness 04/04/2019  . Abdominal pain 04/04/2019  . Bradycardia 04/04/2019  . Acute encephalopathy 04/03/2019  . Hypothyroidism 04/03/2019  . Major depressive disorder, recurrent episode, moderate ([place]) 03/21/2019  . Generalized weakness 03/16/2019  . Thyroid disease   . Dystonia   . Pneumonia due to COVID-19 virus   . Elevated transaminase level   . Hypertension   . Chest pain   . COVID-19 virus infection     Past Surgical History:  Procedure Laterality Date  . TUBAL LIGATION      Current Outpatient Rx  . Order #: [id number] Class: Historical Med  . Order #: [id number] Class: Normal  . Order #: [id number] Class: Normal   . Order #: [id number] Class: Normal  . Order #: [id number] Class: Normal  . Order #: [id number] Class: Normal  . Order #: [id number] Class: Normal  . Order #: [id number] Class: Normal  . Order #: [id number] Class: Normal  . Order #: [id number] Class: Historical Med  . Order #: [id number] Class: Historical Med  . Order #: [id number] Class: Normal    Allergies Sinemet [carbidopa w-levodopa], Codeine, and Tetracyclines & related  No family history on file.  Social History Social History   Tobacco Use  . Smoking status: Never Smoker  . Smokeless tobacco: Never Used  Substance Use Topics  . Alcohol use: Yes    Comment: social drinker, infrequent  . Drug use: Never    Review of Systems  All other systems negative except as documented in the HPI. All pertinent positives and negatives as reviewed in the HPI. ____________________________________________   PHYSICAL EXAM:  VITAL SIGNS: ED Triage Vitals  Enc Vitals Group     BP 04/13/19 1925 (!) 144/80     Pulse Rate 04/13/19 1925 60     Resp 04/13/19 1925 18     Temp 04/13/19 1925 97.7 F (36.5 C)     Temp Source 04/13/19 1925 Oral     SpO2 04/13/19 1925 98 %    Constitutional: Alert and oriented. Well appearing and in no acute distress. Eyes: Conjunctivae are normal. PERRL. EOMI. Head: Atraumatic. Nose: No congestion/rhinnorhea. Mouth/Throat: Mucous membranes are moist.  Oropharynx non-erythematous. Neck: No stridor.  No meningeal signs.   Cardiovascular: Normal rate, regular rhythm. Good peripheral circulation. Grossly normal heart sounds.   Respiratory: Normal respiratory effort.  No retractions. Lungs CTAB. Gastrointestinal: Soft and nontender. No distention.  Musculoskeletal: No lower extremity tenderness nor edema. No gross deformities of extremities. Pain to posterior right thigh with active and passive ROM Neurologic:  Diminished sensation in RLE compared to left. Decreased right leg flexion at hip. Able to lift up against  gravity.  Skin:  Skin is warm, dry and intact. No rash noted.  ____________________________________________   LABS (all labs ordered are listed, but only abnormal results are displayed)  Labs Reviewed  COMPREHENSIVE METABOLIC PANEL - Abnormal; Notable for the following components:      Result Value   Potassium 3.4 (*)    Glucose, Bld 107 (*)    BUN 25 (*)    Total Bilirubin 0.2 (*)    All other components within normal limits  URINALYSIS, ROUTINE W REFLEX MICROSCOPIC - Abnormal; Notable for the following components:   APPearance HAZY (*)    Specific Gravity, Urine 1.031 (*)    Ketones, ur 5 (*)    Leukocytes,Ua SMALL (*)    Bacteria, UA RARE (*)    All other components within normal limits  URINE CULTURE  CBC WITH DIFFERENTIAL/PLATELET   ____________________________________________  RADIOLOGY  Ct Lumbar Spine Wo Contrast  Result Date: 04/14/2019 CLINICAL DATA:  Felt pop while transferring to wheelchair, right hip pain radiating to thigh. EXAM: CT LUMBAR SPINE WITHOUT CONTRAST TECHNIQUE: Multidetector CT imaging of the lumbar spine was performed without intravenous contrast administration. Multiplanar CT image reconstructions were also generated. COMPARISON:  CT abdomen pelvis 03/31/2019, lumbar CT 04/04/2019 FINDINGS: Segmentation: 5 lumbar type vertebrae. Alignment: Kyphoplasty changes at L1 with retropulsion of the vertebral body into the spinal canal of approximately 4 mm. Focal kyphotic curvature at this level. Slight exaggeration of the lumbar lordosis. Vertebrae: Vertebral plana deformity of L1 with post augmentation changes in the anterior vertebral body. Approximately 60% residual height loss seen anteriorly. Stable mild anterior wedging of T12 similar to comparison and possibly physiologic. Interspinous arthrosis noted from L2-L5. Paraspinal and other soft tissues: No paravertebral fluid or swelling. No visible canal hematoma. Paraspinal muscle atrophy is similar to prior.  Posterior soft tissues of the abdomen and pelvis reveal aortoiliac atherosclerosis. A calcified fibroid uterus is noted as well. Remaining soft tissues are unremarkable. Respiratory motion noted in the lung bases. Disc levels: Level by level evaluation of the lumbar spine below: T10-T11: Moderate disc height loss with anterior osteophytosis. No significant posterior disc abnormality. Mild right neural foraminal narrowing. No significant left neural foraminal narrowing or canal stenosis. T11-T12: Moderate disc height loss with anterior osteophytosis mild right foraminal narrowing. Small amount of synovial thickening of the facets. No significant spinal canal or foraminal narrowing. T12-L1: Retropulsion of the posterior fragments of L1 resulting and moderate canal stenosis. Near complete disc height loss with vacuum phenomenon and disc desiccation. No significant neural foraminal narrowing. L1-L2: Near complete disc height loss. Retropulsion of the L1 inferior endplate fragment. Mild synovial thickening of the facets. Findings result in mild canal stenosis and moderate bilateral foraminal narrowing. L2-L3: Mild disc height loss with global disc bulge. No significant canal stenosis or foraminal narrowing. L3-L4: Mild disc height loss and some ligamentum flavum infolding with mild-to-moderate facet hypertrophic changes and synovial thickening. Features result in no significant canal stenosis and mild bilateral neural foraminal narrowing. L4-L5: Mild global disc bulge moderate bilateral facet hypertrophic degenerative  change. No significant canal stenosis. Mild neural foraminal narrowing bilaterally. L5-S1: Moderate disc height loss with calcified global disc bulge and left central disc protrusion. Moderate bilateral facet hypertrophy. No significant canal stenosis. Mild right neural foraminal narrowing. IMPRESSION: 1. Kyphoplasty changes at L1 with 60% residual height loss and retropulsion into the spinal canal  resulting in moderate canal stenosis. 2. Stable mild anterior wedging of T12, potentially physiologic/developmental. 3. Multilevel degenerative changes of the lumbar spine, as detailed above. At most moderate canal stenosis and foraminal narrowing. 4. Calcified fibroid uterus. 5. Aortic atherosclerosis. Aortic Atherosclerosis (ICD10-I70.0). Electronically Signed   By: Kreg Shropshire M.D.   On: 04/14/2019 05:02   Ct Pelvis Wo Contrast  Result Date: 04/14/2019 CLINICAL DATA:  Right pelvic pain, felt pop while transferring to wheelchair EXAM: CT PELVIS WITHOUT CONTRAST TECHNIQUE: Multidetector CT imaging of the pelvis was performed following the standard protocol without intravenous contrast. COMPARISON:  CT abdomen pelvis 03/31/2019 FINDINGS: Urinary Tract: Lower pole right kidney, visualized course of the ureters, and urinary bladder are unremarkable. Bowel: No small or large bowel wall thickening or dilatation. A normal appendix is visualized. No features of obstruction. Vascular/Lymphatic: Atherosclerotic calcification of the distal abdominal aorta, inflow vessels and proximal outflow vessels. No acute vascular abnormality. No visualized pelvic adenopathy. Reproductive: Multiple calcified uterine fibroids. No visible endometrial thickening. No concerning adnexal lesions. Other: No pelvic free fluid or free gas. No bowel containing hernias. Mild body wall edema. Musculoskeletal: No acute fracture or traumatic malalignment of the pelvis. Sclerotic changes along the right ilium at the level of the SI joint are similar to comparison. Additional degenerative changes noted in the pubic symphysis. Mild bilateral hip arthrosis is seen with a stable left acetabular bone island. No visible hip effusion. Mild atrophy of the musculature is similar to prior. Dedicated lumbar spine imaging was also performed. Please see that report for findings in the lumbar spine. IMPRESSION: 1. No acute fracture or traumatic malalignment of  the pelvis. 2. Please see dedicated lumbar spine CT report for findings in the lumbar spine. 3. Fibroid uterus. Aortic Atherosclerosis (ICD10-I70.0). Electronically Signed   By: Kreg Shropshire M.D.   On: 04/14/2019 04:52   Dg Hip Unilat W Or Wo Pelvis 2-3 Views Right  Result Date: 04/13/2019 CLINICAL DATA:  Recent fall with right hip pain, initial encounter EXAM: DG HIP (WITH OR WITHOUT PELVIS) 3V RIGHT COMPARISON:  None. FINDINGS: There is no evidence of hip fracture or dislocation. There is no evidence of arthropathy or other focal bone abnormality. IMPRESSION: No acute abnormality noted. Electronically Signed   By: Alcide Clever M.D.   On: 04/13/2019 20:37    ____________________________________________   PROCEDURES  Procedure(s) performed:   Procedures   ____________________________________________   INITIAL IMPRESSION / ASSESSMENT AND PLAN / ED COURSE  Bladder scan, if retaining then concern for possible cauda equina? Symptoms sound like a tendon/muscular injury. Pain meds given.   Ct shows some canal stenosis at L1 with retropulsion. Patient still with significant pain and weakness in that right posterior leg. Will proceed with MRI to further eval for occult fracture or cauda equina causing anatomy.   Pertinent labs & imaging results that were available during my care of the patient were reviewed by me and considered in my medical decision making (see chart for details).  ____________________________________________  FINAL CLINICAL IMPRESSION(S) / ED DIAGNOSES  Final diagnoses:  None     MEDICATIONS GIVEN DURING THIS VISIT:  Medications  fentaNYL (SUBLIMAZE) injection 50 mcg (50  mcg Intravenous Given 04/14/19 0416)  methocarbamol (ROBAXIN) tablet 500 mg (500 mg Oral Given 04/14/19 0419)     NEW OUTPATIENT MEDICATIONS STARTED DURING THIS VISIT:  New Prescriptions   No medications on file    Note:  This note was prepared with assistance of Dragon voice recognition  software. Occasional wrong-word or sound-a-like substitutions may have occurred due to the inherent limitations of voice recognition software.   Andrew Blasius, Barbara Cower, MD 04/14/19 [phone]

## 2019-04-14 NOTE — ED Notes (Signed)
Pt returned from MRI. Placed on monitor. Pt requesting pur wick placement to urine. This RN complied with request. Pt comfortable at this time.

## 2019-04-14 NOTE — ED Notes (Signed)
Pt in MRI.

## 2019-04-14 NOTE — Evaluation (Signed)
Physical Therapy Evaluation Patient Details Name: Debra Morgan MRN: [id number] DOB: 14-Feb-1946 Today's Date: 04/14/2019   History of Present Illness  Debra Morgan is a 73 y.o. 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  Recently admitted to the hospital for coronavirus encephalopathy however she was transferring between the wheelchair today she was pivoting on her left foot towards her left when she had an acute onset of sharp pain in her right gluteal area that radiated down the back of her right leg and also up into her right back.  Patient states that this makes it hard for her to transfer at this time.  Her sensations unchanged.  Ago she has decreased strength with flexion at the gluteal muscles.  She also states that she had decreased urine output for the last 12 hours.  She recalls that she has some lumbar spinal stenosis secondary to compressions.   Clinical Impression  Pt admitted with above diagnosis. Pt was able to sit EOB for 10 min with min guard assist .  Did not want to stand due to right leg and buttock pain.  Pt is weak and states her legs just give out on her.  Will need SNF as husband cannot care for pt at home.  Pt currently with functional limitations due to the deficits listed below (see PT Problem List). Pt will benefit from skilled PT to increase their independence and safety with mobility to allow discharge to the venue listed below.      Follow Up Recommendations SNF;Supervision/Assistance - 24 hour    Equipment Recommendations  None recommended by PT    Recommendations for Other Services       Precautions / Restrictions Precautions Precautions: Fall Precaution Comments: h/o falls due to dystonia and R LE locking up Restrictions Weight Bearing Restrictions: No      Mobility  Bed Mobility Overal bed mobility: Modified Independent             General bed mobility comments: increased time with HOB  elevated  Transfers                 General transfer comment: Did not get off bed as right LE painful to sit up. Not safe to get off stretcher as well.   Ambulation/Gait                Stairs            Wheelchair Mobility    Modified Rankin (Stroke Patients Only)       Balance Overall balance assessment: Needs assistance Sitting-balance support: Feet supported;No upper extremity supported Sitting balance-Leahy Scale: Good Sitting balance - Comments: seated balance EOB WFL                                     Pertinent Vitals/Pain Pain Assessment: 0-10 Pain Score: 7  Pain Location: right hamstring and buttocks and low back Pain Descriptors / Indicators: Aching;Grimacing Pain Intervention(s): Limited activity within patient's tolerance;Monitored during session;Repositioned    Home Living Family/patient expects to be discharged to:: Private residence Living Arrangements: Spouse/significant other Available Help at Discharge: Family;Available 24 hours/day Type of Home: House Home Access: Ramped entrance     Home Layout: One level Home Equipment: Transport chair;Bedside commode;Walker - 4 wheels(has bed rail)      Prior Function Level of Independence: Needs assistance   Gait / Transfers  Assistance Needed: husband assists with transfer to transfer chair, uses feet to propel   ADL's / Homemaking Assistance Needed: husband assist with transfer from transport chair to [facility] for bird baths  Comments: right leg gives out worse than left but now both legs give out, hamstring tear on right new onset     Hand Dominance   Dominant Hand: Right    Extremity/Trunk Assessment   Upper Extremity Assessment Upper Extremity Assessment: Defer to OT evaluation    Lower Extremity Assessment RLE Deficits / Details: Right leg is weaker, more stiff, with less ankle ROM compared to left.  Grossly 3-/5 with in bed testing, in sitting R LE stays extended  at the knee despite being able to flex it in the bed.   RLE Sensation: decreased light touch;history of peripheral neuropathy LLE Deficits / Details: ROM WFL, strength grossly 3/5 with movement LLE Sensation: decreased light touch;history of peripheral neuropathy       Communication   Communication: No difficulties  Cognition Arousal/Alertness: Awake/alert Behavior During Therapy: Anxious Overall Cognitive Status: No family/caregiver present to determine baseline cognitive functioning Area of Impairment: Awareness;Following commands                     Memory: Decreased short-term memory Following Commands: Follows one step commands with increased time   Awareness: Emergent   General Comments: reports that she has dementia/Alz, and she frequently states that she cannot do things, becomes anxious when mentioning mobility OOB. Pt needing encouragement      General Comments      Exercises General Exercises - Lower Extremity Ankle Circles/Pumps: AROM;Both;10 reps;Seated Long Arc Quad: AROM;Both;10 reps;Seated Hip Flexion/Marching: AROM;Both;10 reps;Seated   Assessment/Plan    PT Assessment Patient needs continued PT services  PT Problem List Decreased strength;Decreased range of motion;Decreased activity tolerance;Decreased balance;Decreased mobility;Decreased knowledge of use of DME;Decreased knowledge of precautions;Cardiopulmonary status limiting activity       PT Treatment Interventions DME instruction;Gait training;Functional mobility training;Therapeutic activities;Therapeutic exercise;Balance training;Cognitive remediation;Patient/family education;Wheelchair mobility training    PT Goals (Current goals can be found in the Care Plan section)  Acute Rehab PT Goals Patient Stated Goal: to go to SNF for therapy PT Goal Formulation: With patient Time For Goal Achievement: 04/28/19 Potential to Achieve Goals: Fair    Frequency Min 2X/week   Barriers to  discharge Decreased caregiver support(husband hurt his back caring for pt)      Co-evaluation               AM-PAC PT "6 Clicks" Mobility  Outcome Measure Help needed turning from your back to your side while in a flat bed without using bedrails?: None Help needed moving from lying on your back to sitting on the side of a flat bed without using bedrails?: None Help needed moving to and from a bed to a chair (including a wheelchair)?: Total Help needed standing up from a chair using your arms (e.g., wheelchair or bedside chair)?: Total Help needed to walk in hospital room?: Total Help needed climbing 3-5 steps with a railing? : Total 6 Click Score: 12    End of Session   Activity Tolerance: Patient tolerated treatment well Patient left: in bed Nurse Communication: Mobility status;Need for lift equipment PT Visit Diagnosis: Muscle weakness (generalized) (M62.81);Difficulty in walking, not elsewhere classified (R26.2);History of falling (Z91.81)    Time: 1779-3903 PT Time Calculation (min) (ACUTE ONLY): 25 min   Charges:   PT Evaluation $PT Eval Moderate Complexity: 1 Mod  PT Treatments $Therapeutic Exercise: 8-22 mins        Jayse Hodkinson W,PT Acute Rehabilitation Services Pager:  [phone]  Office:  [phone]    Berline LopesDawn F Kim Lauver 04/14/2019, 1:34 PM

## 2019-04-14 NOTE — Progress Notes (Signed)
CSW spoke with patient via phone about going to SNF. Patient reluctantly agreed and stated she understood that her husband can not take care of home at home and she needs to gain some strength. CSW also spoke with patient's husband who was adamant that patient needs short term rehab to get stronger. He reports that in trying to help her when she was discharged Monday he ended up pulling something in his back/shoulder trying to help her transfer. CSW made EDP aware and asked to put in a PT consult and another COVID test. CSW will fax out patient's information.   Golden Circle, LCSW Transitions of Care Department [facility] ED [phone]

## 2019-04-14 NOTE — ED Notes (Signed)
COVID +  PT husband Kanyla Omeara [phone] is asking for an update on pt

## 2019-04-14 NOTE — Discharge Instructions (Addendum)
Please be sure to follow-up with our orthopedic surgery colleagues, or your own if you have a previous relationship with one.  Return here for concerning changes in your condition.  You have been prescribed 3 additional doses of the azithromycin which she began taking yesterday.  This should complete your course of this medication.

## 2019-04-15 LAB — URINE CULTURE: Culture: <10000 — AB

## 2019-04-15 MED ORDER — TRAMADOL HCL 50 MG PO TABS
50.0000 mg | ORAL_TABLET | Freq: Once | ORAL | Status: AC
  Administered 2019-04-15: 50 mg via ORAL
  Filled 2019-04-15: qty 1

## 2019-04-15 NOTE — ED Notes (Signed)
Dinner tray is served  

## 2019-04-15 NOTE — ED Notes (Signed)
Patient given lunch tray.

## 2019-04-15 NOTE — TOC Initial Note (Signed)
Transition of Care [facility]) - Initial/Assessment Note    Patient Details  Name: Debra Morgan MRN: [id number] Date of Birth: 02-13-1946  Transition of Care [facility]) CM/SW Contact:    Montine Circle, LCSW Phone Number: 04/15/2019, 3:29 PM  Clinical Narrative:                 CSW currently seeking SNF placement for patient. Yesterday, patient reluctantly agreed to go to SNF as her husband was very adamant he cannot take care of her right now and wants patient to get stronger through short term rehab.   CSW spoke with patient's daughter who asked for an updated. CSW explained that we have fax her mother's information out to SNFs and awaiting bed offers. CSW provided patient's daughter information on how to access Medicare.gov choice list. Patient's daughter reports her preference is for patient to go to [facility], but understands that high rated facilities are typically full. Patient's daughter understand with waiting on bed offers and getting insurance auth that it will probably not be until Tuesday when patient is discharged. CSW will continue to follow up.  Expected Discharge Plan: Skilled Nursing Facility Barriers to Discharge: No Barriers Identified   Patient Goals and CMS Choice Patient states their goals for this hospitalization and ongoing recovery are:: Spouse would like patient to get short term rehab CMS Medicare.gov Compare Post Acute Care list provided to:: Patient Represenative (must comment)([name] - daughter) Choice offered to / list presented to : Adult Children  Expected Discharge Plan and Services Expected Discharge Plan: Skilled Nursing Facility In-house Referral: Clinical Social Work   Post Acute Care Choice: Skilled Nursing Facility Living arrangements for the past 2 months: Single Family Home                                      Prior Living Arrangements/Services Living arrangements for the past 2 months: Single Family Home Lives with:: Spouse Patient  language and need for interpreter reviewed:: Yes Do you feel safe going back to the place where you live?: Yes      Need for Family Participation in Patient Care: Yes (Comment) Care giver support system in place?: Yes (comment) Current home services: DME Criminal Activity/Legal Involvement Pertinent to Current Situation/Hospitalization: No - Comment as needed  Activities of Daily Living      Permission Sought/Granted Permission sought to share information with : Facility Medical sales representative, Family Supports    Share Information with NAME: Roe Coombs and Idalia Needle  Permission granted to share info w AGENCY: SNFs  Permission granted to share info w Relationship: Spouse and Daughter  Permission granted to share info w Contact Information: [phone] (spouse) [phone] (daughter)  Emotional Assessment Appearance:: Other (Comment Required(Unable to assess) Attitude/Demeanor/Rapport: Unable to Assess Affect (typically observed): Unable to Assess Orientation: : Oriented to Self, Oriented to Place, Oriented to  Time, Oriented to Situation      Admission diagnosis:  hip pain  Patient Active Problem List   Diagnosis Date Noted  . Visual hallucinations 04/05/2019  . Confusion 04/04/2019  . Encephalopathy acute 04/04/2019  . Generalized pain 04/04/2019  . Right leg weakness 04/04/2019  . Abdominal pain 04/04/2019  . Bradycardia 04/04/2019  . Acute encephalopathy 04/03/2019  . Hypothyroidism 04/03/2019  . Major depressive disorder, recurrent episode, moderate (HCC) 03/21/2019  . Generalized weakness 03/16/2019  . Thyroid disease   . Dystonia   . Pneumonia due to COVID-19 virus   .  Elevated transaminase level   . Hypertension   . Chest pain   . COVID-19 virus infection    PCP:  Karleen Hampshire., MD Pharmacy:   [facility], [place] - [address] [place] [address] Phone: [phone] Fax: [phone]     Social Determinants of Health  (SDOH) Interventions    Readmission Risk Interventions Readmission Risk Prevention Plan 04/12/2019  Transportation Screening Complete  PCP or Specialist Appt within 3-5 Days Complete  HRI or [place] Complete  Social Work Consult for [place] Planning/Counseling Complete  Palliative Care Screening Not Applicable  Medication Review Press photographer) Complete

## 2019-04-15 NOTE — NC FL2 (Signed)
[place] MEDICAID FL2 LEVEL OF CARE SCREENING TOOL     IDENTIFICATION  Patient Name: Debra Morgan Birthdate: 19-Apr-1946 Sex: female Admission Date (Current Location): 04/13/2019  [facility] and [place] Number:  Herbalist and Address:  [facility] [place] [place] [address] [place], [place] [address]      Provider Number: [id number]  Attending Physician Name and Address:  Default, Provider, MD  Relative Name and Phone Number:  Timmothy Sours, spouse, [phone]    Current Level of Care: Hospital Recommended Level of Care: [place] Prior Approval Number:    Date Approved/Denied:   PASRR Number: [id number]  Discharge Plan: SNF    Current Diagnoses: Patient Active Problem List   Diagnosis Date Noted  . Visual hallucinations 04/05/2019  . Confusion 04/04/2019  . Encephalopathy acute 04/04/2019  . Generalized pain 04/04/2019  . Right leg weakness 04/04/2019  . Abdominal pain 04/04/2019  . Bradycardia 04/04/2019  . Acute encephalopathy 04/03/2019  . Hypothyroidism 04/03/2019  . Major depressive disorder, recurrent episode, moderate ([place]) 03/21/2019  . Generalized weakness 03/16/2019  . Thyroid disease   . Dystonia   . Pneumonia due to COVID-19 virus   . Elevated transaminase level   . Hypertension   . Chest pain   . COVID-19 virus infection     Orientation RESPIRATION BLADDER Height & Weight     Self, Time, Situation, Place  Normal Continent Weight:   Height:     BEHAVIORAL SYMPTOMS/MOOD NEUROLOGICAL BOWEL NUTRITION STATUS      Continent Diet  AMBULATORY STATUS COMMUNICATION OF NEEDS Skin   Limited Assist Verbally Normal                       Personal Care Assistance Level of Assistance  Bathing, Feeding, Dressing Bathing Assistance: Limited assistance Feeding assistance: Limited assistance Dressing Assistance: Limited assistance     Functional Limitations Info  Sight, Hearing, Speech Sight Info: Adequate Hearing  Info: Adequate Speech Info: Adequate    SPECIAL CARE FACTORS FREQUENCY  PT (By licensed PT), OT (By licensed OT)     PT Frequency: 5x weekly OT Frequency: 5x weekly            Contractures Contractures Info: Not present    Additional Factors Info  Code Status Code Status Info: Partial Allergies Info: Sinemet (Carbidopa W-levodopa), Codeine, Tetracyclines & Related           Current Medications (04/15/2019):  This is the current hospital active medication list Current Facility-Administered Medications  Medication Dose Route Frequency Provider Last Rate Last Dose  . amoxicillin-clavulanate (AUGMENTIN) 875-125 MG per tablet 1 tablet  1 tablet Oral BID Carmin Muskrat, MD   1 tablet at 04/15/19 1012  . benztropine (COGENTIN) tablet 1 mg  1 mg Oral BID Carmin Muskrat, MD   1 mg at 04/15/19 1012  . carvedilol (COREG) tablet 6.25 mg  6.25 mg Oral BID Carmin Muskrat, MD   6.25 mg at 04/15/19 1012  . levothyroxine (SYNTHROID) tablet 100 mcg  100 mcg Oral Q0600 Carmin Muskrat, MD   100 mcg at 04/15/19 1448   Current Outpatient Medications  Medication Sig Dispense Refill  . acetaminophen (TYLENOL) 500 MG tablet Take 1,000 mg by mouth every 6 (six) hours as needed for headache.     Marland Kitchen amoxicillin-clavulanate (AUGMENTIN) 875-125 MG tablet Take 1 tablet by mouth 2 (two) times daily for 5 days. 10 tablet 0  . ARIPiprazole (ABILIFY) 2 MG tablet Take 1 tablet (  2 mg total) by mouth at bedtime. 30 tablet 0  . benztropine (COGENTIN) 1 MG tablet Take 1 tablet (1 mg total) by mouth 2 (two) times daily. 30 tablet 0  . carvedilol (COREG) 6.25 MG tablet Take 1 tablet (6.25 mg total) by mouth 2 (two) times daily. 60 tablet 0  . DULoxetine (CYMBALTA) 20 MG capsule Take 1 capsule (20 mg total) by mouth daily. 30 capsule 0  . ezetimibe (ZETIA) 10 MG tablet Take 1 tablet (10 mg total) by mouth daily. 30 tablet 0  . guaiFENesin-dextromethorphan (ROBITUSSIN DM) 100-10 MG/5ML syrup Take 10 mLs by  mouth every 4 (four) hours as needed for cough. 236 mL 1  . levothyroxine (SYNTHROID) 100 MCG tablet Take 1 tablet (100 mcg total) by mouth daily at 6 (six) AM. 30 tablet 0  . Multiple Vitamin (MULTIVITAMIN WITH MINERALS) TABS tablet Take 1 tablet by mouth daily.    . Multiple Vitamins-Minerals (AIRBORNE) TBEF Take 1 tablet by mouth daily as needed (immune system boost).    . ondansetron (ZOFRAN) 4 MG tablet Take 1 tablet (4 mg total) by mouth every 6 (six) hours as needed for nausea. (Patient not taking: Reported on 04/14/2019) 20 tablet 0  . traMADol (ULTRAM) 50 MG tablet Take 1 tablet (50 mg total) by mouth every 6 (six) hours as needed. 15 tablet 0     Discharge Medications: Please see discharge summary for a list of discharge medications.  Relevant Imaging Results:  Relevant Lab Results:   Additional Information SSN: [id number]  Montine Circle, LCSW

## 2019-04-15 NOTE — ED Notes (Signed)
Texted PA for pain medication.

## 2019-04-15 NOTE — ED Notes (Signed)
Patient denies pain and is resting comfortably.  

## 2019-04-16 MED ORDER — ARIPIPRAZOLE 2 MG PO TABS
2.0000 mg | ORAL_TABLET | Freq: Every day | ORAL | Status: DC
  Administered 2019-04-17 – 2019-04-19 (×3): 2 mg via ORAL
  Filled 2019-04-16 (×4): qty 1

## 2019-04-16 MED ORDER — TRAMADOL HCL 50 MG PO TABS
50.0000 mg | ORAL_TABLET | Freq: Three times a day (TID) | ORAL | Status: DC | PRN
  Administered 2019-04-16 – 2019-04-19 (×5): 50 mg via ORAL
  Filled 2019-04-16 (×5): qty 1

## 2019-04-16 MED ORDER — DULOXETINE HCL 20 MG PO CPEP
20.0000 mg | ORAL_CAPSULE | Freq: Every day | ORAL | Status: DC
  Administered 2019-04-16 – 2019-04-20 (×5): 20 mg via ORAL
  Filled 2019-04-16 (×5): qty 1

## 2019-04-16 NOTE — ED Notes (Addendum)
Home Meds - Cymbalta and Abilify ordered - Pharm verified pt's meds.

## 2019-04-16 NOTE — ED Notes (Signed)
Pt placed on bedpan as requested d/t states she is unable to stand and needs to have BM.

## 2019-04-16 NOTE — TOC Progression Note (Addendum)
Transition of Care [facility]) - Progression Note    Patient Details  Name: Deeksha Cotrell MRN: [id number] Date of Birth: Sep 28, 1945  Transition [facility]) CM/SW Forbes, Rosita Work Phone Number: 04/16/2019, 11:22 AM  Clinical Narrative:    PT's daughter, [name], called MSW Intern to confirm their choice for SNF Placement. The family chose [facility], but expressed that they required a private room for the PT. SW said that this could not be guaranteed, but it would be looked into. MSW Intern called [facility], and was sent to another system, currently waiting for a call back for confirmation. [facility] has private beds available, but were unsure about out of pocket costs and insurance benefits. The other facilities were not available or did not offer private rooms. MSW Intern called Malachy Mood at [phone], and was told that SW will receive a call back with information. SW will continue to follow.  Addended 12:26 11/28 MSW Intern got confirmation that [facility] had private beds available, possibly on Monday. SW contacted Pt's daughter to advise her about this info and get consent to start the insurance authorization. SW told her that we would not know about out of pocket cost or if insurance will cover a private bed until we get authorization. She consented and SW contacted [facility] to start authorization and confirm bed offer, they will call SW back with more information.. Daughter requested that the spouse be contacted with insurance information when it is available. SW will continue to follow.    Expected Discharge Plan: [place] Barriers to Discharge: No Barriers Identified  Expected Discharge Plan and Services Expected Discharge Plan: [place] In-house Referral: Clinical Social Work   Post Acute Care Choice: [place] Living arrangements for the past 2 months: [place] Determinants of Health (SDOH) Interventions    Readmission Risk Interventions Readmission Risk Prevention Plan 04/12/2019  Transportation Screening Complete  PCP or Specialist Appt within 3-5 Days Complete  HRI or [place] Complete  Social Work Consult for [place] Planning/Counseling Complete  Palliative Care Screening Not Applicable  Medication Review Press photographer) Complete

## 2019-04-16 NOTE — ED Notes (Signed)
SW in w/pt. 

## 2019-04-16 NOTE — TOC Progression Note (Signed)
Transition of Care [facility]) - Progression Note    Patient Details  Name: Debra Morgan MRN: [id number] Date of Birth: Dec 14, 1945  Transition of Care [facility]) CM/SW [place], [place] Work Phone Number: 04/16/2019, 10:03 AM  Clinical Narrative:    MSW Intern spoke with PT to offer choice for SNF placement. PT was alert and amicable, but advised SW that she was not the one who was making the decision, she asked that her daughter, Debra Morgan, be called to discuss options. Daughter called MSW Intern within a few minutes to discuss options. PT had asked that she be placed somewhere close, and her daughter confirmed that [place] the best option.  MSW Intern offered choices and confirmed that [facility] was the only option in [facility]. Daughter was appreciative of the information, but really wants her mother to go to [place], in [place]. MSW Intern advised daughter that PT had not been accepted to [facility], Daughter said she would call and ask about availability and follow up with SW within an hour. MSW Intern will continue to follow.   Expected Discharge Plan: [place] Barriers to Discharge: No Barriers Identified  Expected Discharge Plan and Services Expected Discharge Plan: [place] In-house Referral: Clinical Social Work   Post Acute Care Choice: [place] Living arrangements for the past 2 months: [place] Determinants of Health (SDOH) Interventions    Readmission Risk Interventions Readmission Risk Prevention Plan 04/12/2019  Transportation Screening Complete  PCP or Specialist Appt within 3-5 Days Complete  HRI or [place] Complete  Social Work Consult for [place] Planning/Counseling Complete  Palliative Care Screening Not Applicable  Medication Review Press photographer) Complete

## 2019-04-16 NOTE — ED Notes (Signed)
Sent a message to Dr Vanita Panda requesting rest of pt's home meds to be ordered and requested to include Tramadol for pain.

## 2019-04-16 NOTE — ED Notes (Signed)
Breakfast ordered 

## 2019-04-17 ENCOUNTER — Emergency Department (HOSPITAL_COMMUNITY): Payer: Medicare Other

## 2019-04-17 IMAGING — DX DG CHEST 1V PORT
1 series · 1 of 1 positions shown · non-contrast
Comparison: [DATE]

CLINICAL DATA: Wheezing

EXAM:
PORTABLE CHEST 1 VIEW

[chest ap]
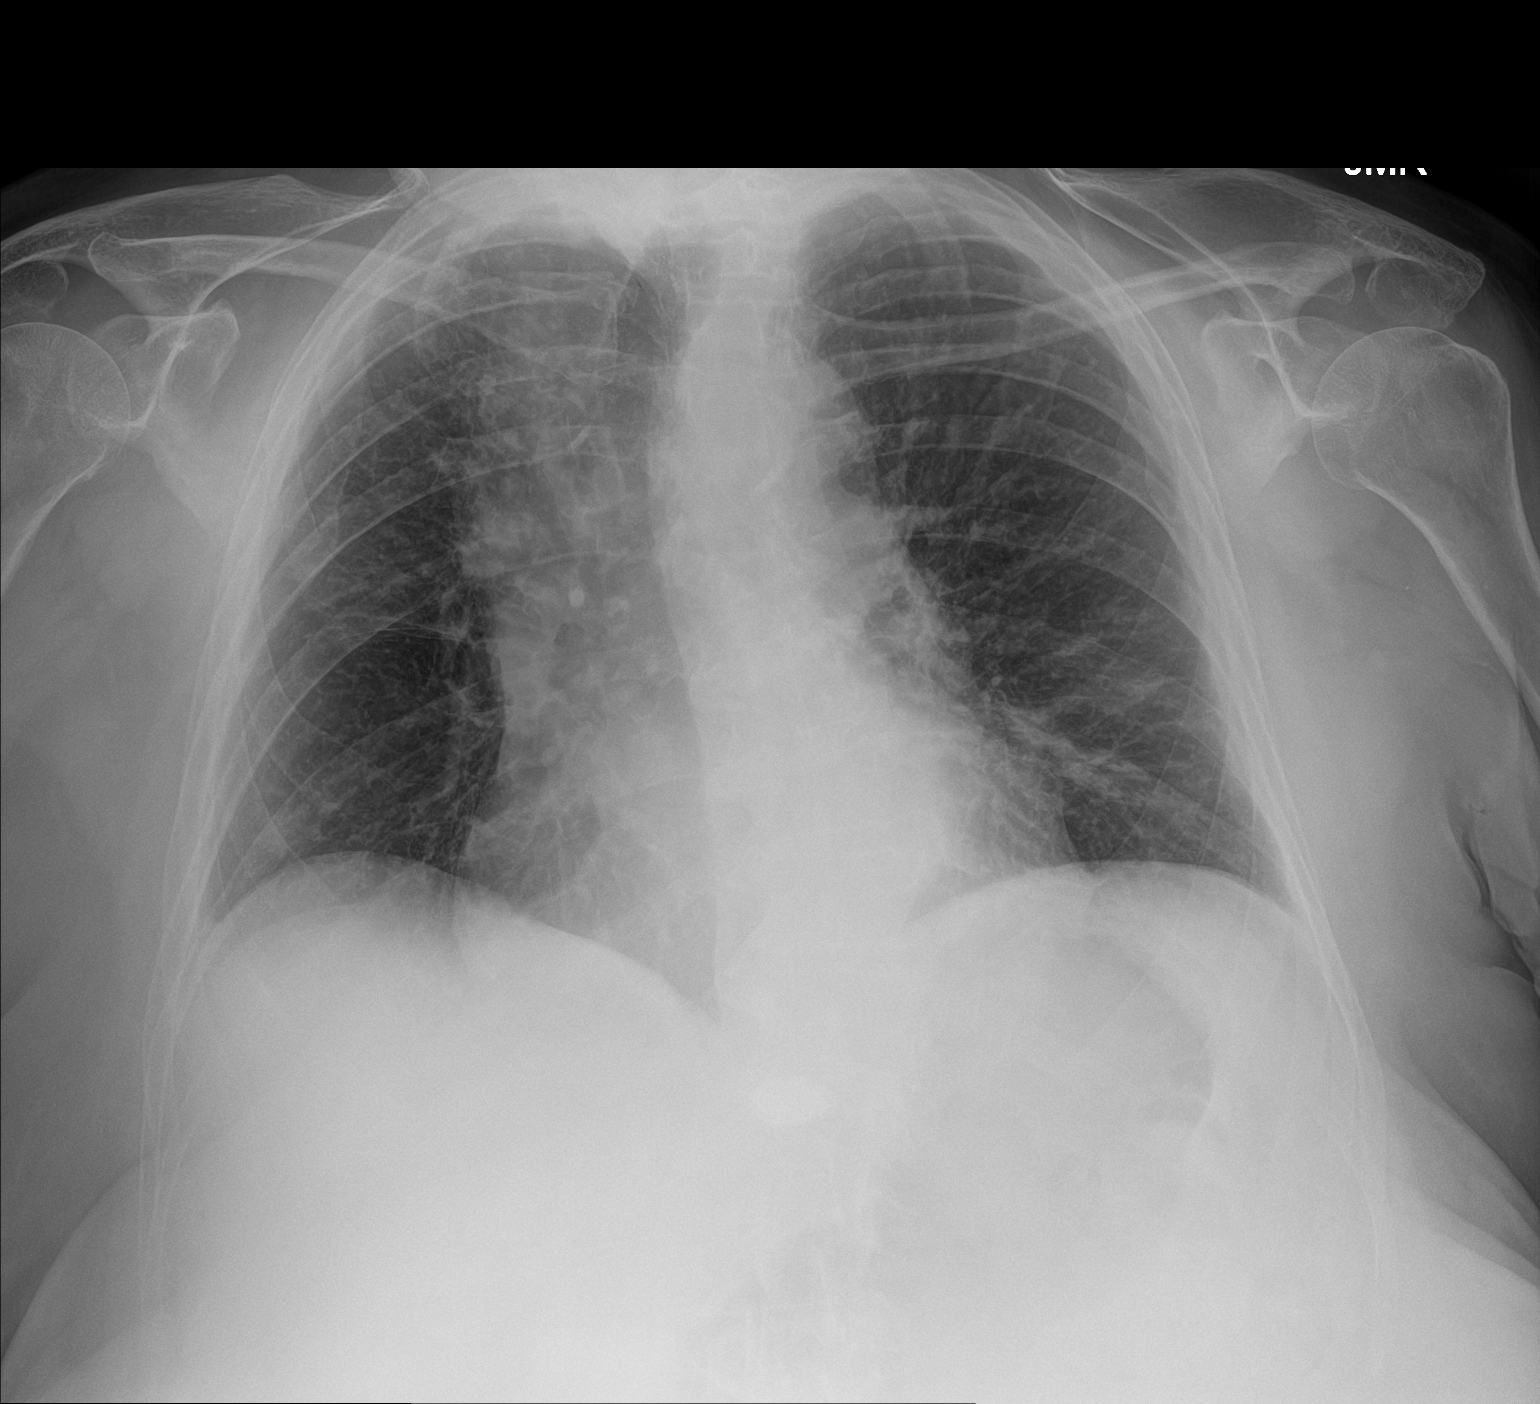

[1 of 1 positions shown; findings below may reference images not displayed]

FINDINGS: There is mild atelectatic change in the left lower lobe. The lungs
elsewhere are clear. Heart is upper normal in size with pulmonary
vascularity normal. No adenopathy. There is aortic atherosclerotic
period no bone lesions.
IMPRESSION: Mild left lower lobe atelectasis. No edema or consolidation. Heart
upper normal in size. No adenopathy. Aortic Atherosclerosis
([KW]-[KW]).

## 2019-04-17 MED ORDER — ALBUTEROL SULFATE HFA 108 (90 BASE) MCG/ACT IN AERS
2.0000 | INHALATION_SPRAY | Freq: Four times a day (QID) | RESPIRATORY_TRACT | Status: DC
  Administered 2019-04-17 – 2019-04-20 (×10): 2 via RESPIRATORY_TRACT
  Filled 2019-04-17 (×3): qty 6.7

## 2019-04-17 NOTE — ED Notes (Signed)
Breakfast tray ordered 

## 2019-04-17 NOTE — ED Notes (Signed)
Pt sitting up in recliner eating dinner.

## 2019-04-17 NOTE — ED Notes (Signed)
Pt's spouse called requesting for pt to receive assistance to sit in chair. Advised will attempt. Pt noted to be sleeping - will attempt when awakens.

## 2019-04-17 NOTE — ED Notes (Signed)
Pt's lunch arrived 

## 2019-04-17 NOTE — ED Notes (Signed)
Pt c/o wheezing - states "I've never had any wheezing in my life". Pt requesting for dr to "listen to her". Message sent to Dr Vanita Panda.

## 2019-04-17 NOTE — ED Notes (Signed)
Patient denies pain and is resting comfortably.  

## 2019-04-17 NOTE — ED Notes (Signed)
Pt sitting in recliner. States she wants to stay sitting up for a while and will advise staff when she is ready to move back to bed.

## 2019-04-17 NOTE — ED Notes (Signed)
Pt's breakfast arrived 

## 2019-04-17 NOTE — ED Notes (Signed)
Dr Vanita Panda assessed pt - advised entering orders.

## 2019-04-17 NOTE — ED Notes (Signed)
Pt is now awake - assisted x 2 to sit in recliner as requested by pt and spouse. Pt states she feels much better sitting up for a while. Call bell w/in reach. Pt talking on cell phone.

## 2019-04-17 NOTE — ED Notes (Signed)
Pt's lunch ordered 

## 2019-04-17 NOTE — ED Notes (Signed)
Portable CXR being performed.

## 2019-04-18 LAB — SARS CORONAVIRUS 2 (TAT 6-24 HRS): SARS Coronavirus 2: NEGATIVE

## 2019-04-18 MED ORDER — HYDROCODONE-ACETAMINOPHEN 5-325 MG PO TABS
1.0000 | ORAL_TABLET | ORAL | Status: AC
  Filled 2019-04-18: qty 1

## 2019-04-18 NOTE — Progress Notes (Addendum)
3pm: CSW spoke with Juliann Pulse at [facility] who states they can provide this patient with a private room tomorrow. Juliann Pulse will obtain insurance authorization for this patient.  CSW informed daughter [name] of information. [name] reports her mother and father are both agreeable.   1pm: CSW spoke with Lorrie at [facility] who states there are no female beds available at this time.  CSW updated patient's daughter [name] and she requested that CSW find any facility with a private room for placement.  11am: CSW spoke with patient's daughter [name] at [phone] to provide her with an update.  10am: CSW received return call from Elgin at [facility] regarding this patient. Lorrie to speak with Garrison Columbus at the facility regarding the request for a private room.  9am: CSW reached out to admissions staff at [facility] to check on the status of this patient's admission into the facility. CSW will await for a return call.  [name], MSW, LCSW-A Transitions of Care  Clinical Social Worker  [facility] Emergency Departments  Medical ICU [phone]

## 2019-04-18 NOTE — ED Notes (Signed)
Diet was ordered for Lunch. 

## 2019-04-18 NOTE — ED Triage Notes (Signed)
Dinner did not arrive when all other meals were delivered. Service response called to order dinner meal.

## 2019-04-18 NOTE — ED Triage Notes (Addendum)
PT reports she has not had relief of pain from ultram.

## 2019-04-18 NOTE — Progress Notes (Signed)
Physical Therapy Treatment Patient Details Name: Debra Morgan MRN: [id number] DOB: 1945-07-01 Today's Date: 04/18/2019    History of Present Illness Debra Morgan is a 73 y.o. 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  Recently admitted to the hospital for coronavirus encephalopathy however she was transferring between the wheelchair today she was pivoting on her left foot towards her left when she had an acute onset of sharp pain in her right gluteal area that radiated down the back of her right leg and also up into her right back.  Patient states that this makes it hard for her to transfer at this time.  Her sensations unchanged.  Ago she has decreased strength with flexion at the gluteal muscles.  She also states that she had decreased urine output for the last 12 hours.  She recalls that she has some lumbar spinal stenosis secondary to compressions.     PT Comments    Pt remains very anxious stating "I just can't do anything anymore". Pt was w/c dependent PTA however was able to complete std pvt transfers to/from chair/bsc. Pt with fear of falling and anxiety limiting OOB mobility and standing ability. Con't to recommend SNF upon d/c for increased time to achieve safe transfers to/from w/c to return home with spouse.   Follow Up Recommendations  SNF;Supervision/Assistance - 24 hour     Equipment Recommendations  None recommended by PT    Recommendations for Other Services       Precautions / Restrictions Precautions Precautions: Fall Precaution Comments: h/o falls due to dystonia and R LE locking up Restrictions Weight Bearing Restrictions: No    Mobility  Bed Mobility Overal bed mobility: Needs Assistance Bed Mobility: Supine to Sit     Supine to sit: Mod assist     General bed mobility comments: increased time, modA for trunk elevation and to scoot to EOB so feet are on the floor  Transfers Overall transfer  level: Needs assistance Equipment used: 1 person hand held assist Transfers: Sit to/from Stand;Lateral/Scoot Transfers Sit to Stand: Mod assist;Max assist        Lateral/Scoot Transfers: Min assist;Mod assist General transfer comment: mod/maxA to power up, attempted x2, pt with strong posterior push/lean, unsafe to complete std pvt to chair, pt instructed on lateral scoot, pt required max directional verbal cues, and minA to maintain balance however pt able to use UEs to scoot her self into the drop arm recliner towards her L  Ambulation/Gait             General Gait Details: unable   Stairs             Wheelchair Mobility    Modified Rankin (Stroke Patients Only)       Balance Overall balance assessment: Needs assistance Sitting-balance support: Feet supported;Bilateral upper extremity supported Sitting balance-Leahy Scale: Fair Sitting balance - Comments: pt with posterior lean   Standing balance support: Bilateral upper extremity supported Standing balance-Leahy Scale: Zero Standing balance comment: unable to achieve and maintain standing                            Cognition Arousal/Alertness: Awake/alert Behavior During Therapy: Anxious Overall Cognitive Status: No family/caregiver present to determine baseline cognitive functioning Area of Impairment: Awareness;Following commands                       Following Commands: Follows one  step commands with increased time;Follows one step commands consistently   Awareness: Emergent   General Comments: pt very anxious and gets upset that "I can't do anyting" "You just get up and walk and I can't" pt with severe fear of falling      Exercises General Exercises - Lower Extremity Ankle Circles/Pumps: AROM;Both;10 reps;Seated Long Arc Quad: AROM;Both;10 reps;Seated Hip Flexion/Marching: AROM;Both;10 reps;Seated    General Comments        Pertinent Vitals/Pain Pain Assessment:  0-10 Pain Score: 5  Pain Location: right hamstring and buttocks and low back Pain Descriptors / Indicators: Aching;Grimacing Pain Intervention(s): Limited activity within patient's tolerance    Home Living                      Prior Function            PT Goals (current goals can now be found in the care plan section) Acute Rehab PT Goals Patient Stated Goal: to go to SNF for therapy Progress towards PT goals: Progressing toward goals    Frequency    Min 2X/week      PT Plan Current plan remains appropriate    Co-evaluation              AM-PAC PT "6 Clicks" Mobility   Outcome Measure  Help needed turning from your back to your side while in a flat bed without using bedrails?: A Lot Help needed moving from lying on your back to sitting on the side of a flat bed without using bedrails?: A Lot Help needed moving to and from a bed to a chair (including a wheelchair)?: Total Help needed standing up from a chair using your arms (e.g., wheelchair or bedside chair)?: Total Help needed to walk in hospital room?: Total Help needed climbing 3-5 steps with a railing? : Total 6 Click Score: 8    End of Session Equipment Utilized During Treatment: Gait belt Activity Tolerance: (limited by anxiety) Patient left: in chair;with call bell/phone within reach Nurse Communication: Mobility status(instructed on lateral scoot) PT Visit Diagnosis: Muscle weakness (generalized) (M62.81);Difficulty in walking, not elsewhere classified (R26.2);History of falling (Z91.81)     Time: 1342-1410 PT Time Calculation (min) (ACUTE ONLY): 28 min  Charges:  $Therapeutic Exercise: 8-22 mins $Therapeutic Activity: 8-22 mins                     Kittie Plater, PT, DPT Acute Rehabilitation Services Pager #: [phone] Office #: [phone]    Berline Lopes 04/18/2019, 2:24 PM

## 2019-04-18 NOTE — ED Triage Notes (Signed)
Pt offered new pain med ordered per Verneita Griffes but Pt reported she wanted to wait .

## 2019-04-19 MED ORDER — AZITHROMYCIN 250 MG PO TABS
500.0000 mg | ORAL_TABLET | Freq: Once | ORAL | Status: AC
  Administered 2019-04-19: 500 mg via ORAL
  Filled 2019-04-19: qty 2

## 2019-04-19 NOTE — ED Notes (Signed)
Message sent to Dr Stark Jock x 2 as requested by pt's daughter to consider changing pt's antibiotic

## 2019-04-19 NOTE — ED Notes (Signed)
Pt's spouse called and requested pt's antibiotic be changed d/t pt now w/prod cough - clear w/yellow-tinged sputum. Pt requesting same. Pt aware, per Dr Stark Jock, to give antibiotic one more day. Pt voiced understanding.

## 2019-04-19 NOTE — Progress Notes (Signed)
CSW has spoke with the patient's daughter Debra Morgan several times informing her that insurance authorization has yet to be obtained by the facility. Debra Morgan also spent a lengthy amount of time insisting that her mother's antibiotic be changed before discharge to SNF. CSW informed RN of request who has been in communication with the patient's EDP.  CSW checked with facility for authorization and it has not been received yet. Business office staff at [facility] will attempt to reach insurance company for follow up.  [name], MSW, LCSW-A Transitions of Care  Clinical Social Worker  [facility] Emergency Departments  Medical ICU [phone]

## 2019-04-19 NOTE — ED Notes (Signed)
Message sent to Dr Stark Jock d/t pt c/o prod cough - clear w/yellow-tinged sputum. Pt is asking Augmentin should be changed.

## 2019-04-19 NOTE — ED Notes (Signed)
Pt sitting in recliner watching tv. Nad.

## 2019-04-19 NOTE — ED Notes (Signed)
Sitting in recliner watching tv. Nad.

## 2019-04-20 MED ORDER — AZITHROMYCIN 250 MG PO TABS: 250.0000 mg | ORAL_TABLET | Freq: Every day | ORAL | 0 refills | Status: AC

## 2019-04-20 MED ORDER — AZITHROMYCIN 250 MG PO TABS
250.0000 mg | ORAL_TABLET | Freq: Once | ORAL | Status: AC
  Administered 2019-04-20: 250 mg via ORAL
  Filled 2019-04-20: qty 1

## 2019-04-20 NOTE — ED Notes (Addendum)
Report called to [place], staff at [facility]. AVS faxed to facility

## 2019-04-20 NOTE — ED Notes (Signed)
Dr. Vanita Panda alerted that pt needs to be discharged before transport.

## 2019-04-20 NOTE — ED Notes (Signed)
Breakfast ordered 

## 2019-04-20 NOTE — ED Provider Notes (Signed)
12:10 PM Patient accepted at a rehabilitation facility. Per request, chart reviewed to ensure completion of antibiotics or seemingly started yesterday. She will receive additional dose today, has 3 additional doses of azithromycin scheduled for the coming days.   Carmin Muskrat, MD 04/20/19 1210

## 2019-04-20 NOTE — Progress Notes (Addendum)
10:40am: Patient will discharge to [facility] at 2pm via [place]. Patient will go to room 116. The number to call for report is [phone].   CSW updated patient's daughter [name] of discharge plan. CSW updated patient's husband Timmothy Sours at [phone].  10:20am: CSW received return call from [facility] stating this patient's insurance authorization has been approved. The authorization number is [id number] and reference number is [id number].  CSW notified Juliann Pulse at [facility] who is working to obtain bed assignment information.  8:50am: CSW spoke with Juliann Pulse at [facility] who states [employer] is requesting updated clinical information CSW faxed updated clinicals to [facility] for review.  [name], MSW, LCSW-A Transitions of Care  Clinical Social Worker  [facility] Emergency Departments  Medical ICU [phone]

## 2021-02-23 ENCOUNTER — Emergency Department (HOSPITAL_BASED_OUTPATIENT_CLINIC_OR_DEPARTMENT_OTHER)
Admission: EM | Admit: 2021-02-23 | Discharge: 2021-02-23 | Disposition: A | Discharge status: Home / Self Care | Payer: Medicare Other | Attending: Emergency Medicine | Admitting: Emergency Medicine

## 2021-02-23 ENCOUNTER — Other Ambulatory Visit: Payer: Self-pay

## 2021-02-23 ENCOUNTER — Emergency Department (HOSPITAL_BASED_OUTPATIENT_CLINIC_OR_DEPARTMENT_OTHER): Payer: Medicare Other

## 2021-02-23 ENCOUNTER — Encounter (HOSPITAL_BASED_OUTPATIENT_CLINIC_OR_DEPARTMENT_OTHER): Payer: Self-pay | Admitting: Urology

## 2021-02-23 DIAGNOSIS — I10 Essential (primary) hypertension: Secondary | ICD-10-CM | POA: Insufficient documentation

## 2021-02-23 DIAGNOSIS — R42 Dizziness and giddiness: Secondary | ICD-10-CM | POA: Diagnosis present

## 2021-02-23 DIAGNOSIS — Z8616 Personal history of COVID-19: Secondary | ICD-10-CM | POA: Insufficient documentation

## 2021-02-23 DIAGNOSIS — Z79899 Other long term (current) drug therapy: Secondary | ICD-10-CM | POA: Insufficient documentation

## 2021-02-23 DIAGNOSIS — E039 Hypothyroidism, unspecified: Secondary | ICD-10-CM | POA: Diagnosis not present

## 2021-02-23 DIAGNOSIS — T887XXA Unspecified adverse effect of drug or medicament, initial encounter: Secondary | ICD-10-CM

## 2021-02-23 LAB — BASIC METABOLIC PANEL
Anion gap: 9 (ref 5–15)
BUN: 20 mg/dL (ref 8–23)
CO2: 26 mmol/L (ref 22–32)
Calcium: 9.4 mg/dL (ref 8.9–10.3)
Chloride: 100 mmol/L (ref 98–111)
Creatinine, Ser: 0.77 mg/dL (ref 0.44–1.00)
GFR, Estimated: >60 mL/min (ref >60)
Glucose, Bld: 138 mg/dL — ABNORMAL HIGH (ref 70–99)
Potassium: 4.1 mmol/L (ref 3.5–5.1)
Sodium: 135 mmol/L (ref 135–145)

## 2021-02-23 LAB — CBC WITH DIFFERENTIAL/PLATELET
Abs Immature Granulocytes: 0.05 10*3/uL (ref 0.00–0.07)
Basophils Absolute: 0 10*3/uL (ref 0.0–0.1)
Basophils Relative: 0 %
Eosinophils Absolute: 0 10*3/uL (ref 0.0–0.5)
Eosinophils Relative: 0 %
HCT: 38.9 % (ref 36.0–46.0)
Hemoglobin: 12.9 g/dL (ref 12.0–15.0)
Immature Granulocytes: 1 %
Lymphocytes Relative: 24 %
Lymphs Abs: 1.7 10*3/uL (ref 0.7–4.0)
MCH: 31.2 pg (ref 26.0–34.0)
MCHC: 33.2 g/dL (ref 30.0–36.0)
MCV: 94 fL (ref 80.0–100.0)
Monocytes Absolute: 0.2 10*3/uL (ref 0.1–1.0)
Monocytes Relative: 2 %
Neutro Abs: 5.1 10*3/uL (ref 1.7–7.7)
Neutrophils Relative %: 73 %
Platelets: 299 10*3/uL (ref 150–400)
RBC: 4.14 MIL/uL (ref 3.87–5.11)
RDW: 13.4 % (ref 11.5–15.5)
WBC: 6.9 10*3/uL (ref 4.0–10.5)
nRBC: 0 % (ref 0.0–0.2)

## 2021-02-23 IMAGING — CT CT HEAD W/O CM
3 series · 15 of 47 positions shown, 18 images · non-contrast
Comparison: [DATE], MRI [DATE]

CLINICAL DATA: Dizziness, non-specific.  Hypertension.

EXAM:
CT HEAD WITHOUT CONTRAST
TECHNIQUE: Contiguous axial images were obtained from the base of the skull
through the vertex without intravenous contrast.

[Series 2: head wo · axial · 0.45mm/px · z∈[-443,-303]mm · 9 of 34 slices shown, 12 images]
[im 3/34  brain]
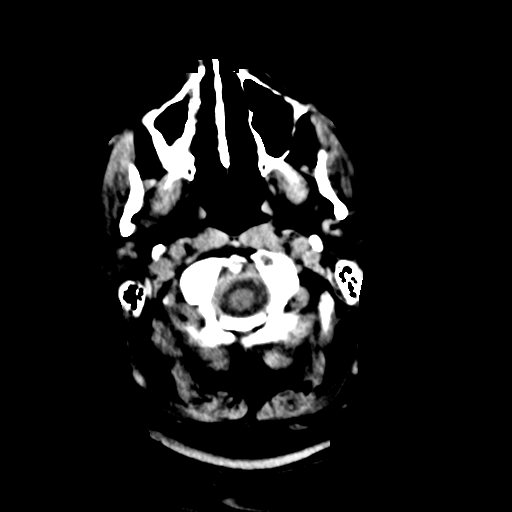
[im 3/34  bone]
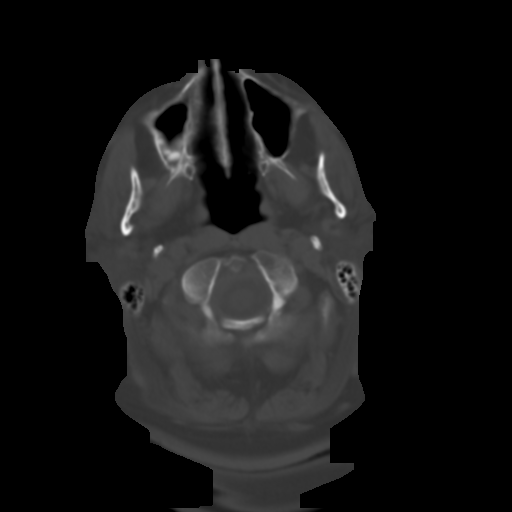
[im 6/34  brain]
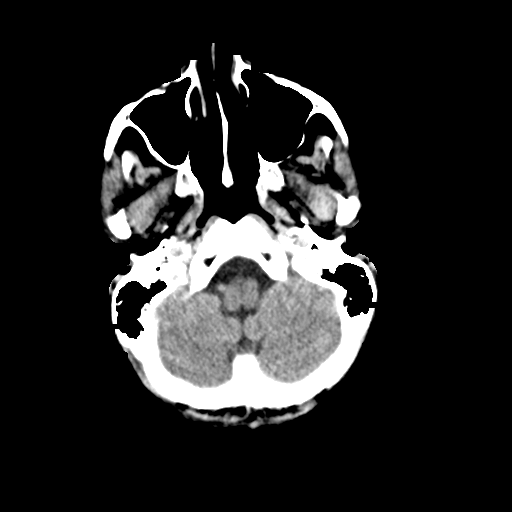
[im 10/34  brain]
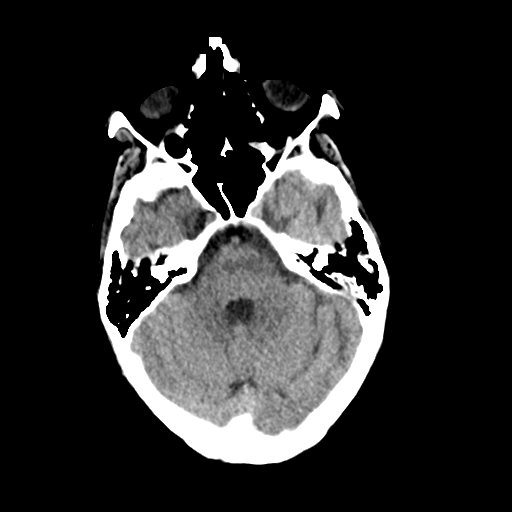
[im 13/34  brain]
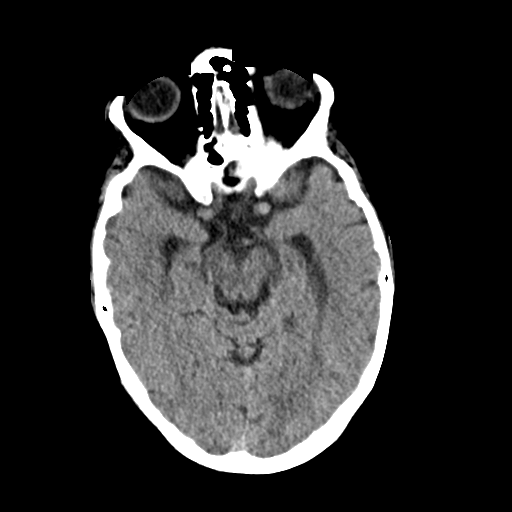
[im 18/34  brain]
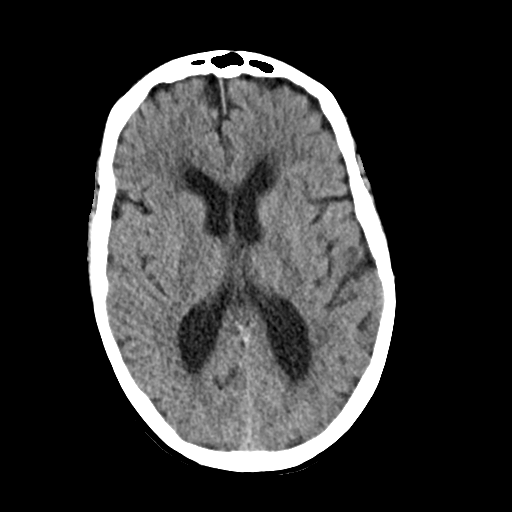
[im 18/34  bone]
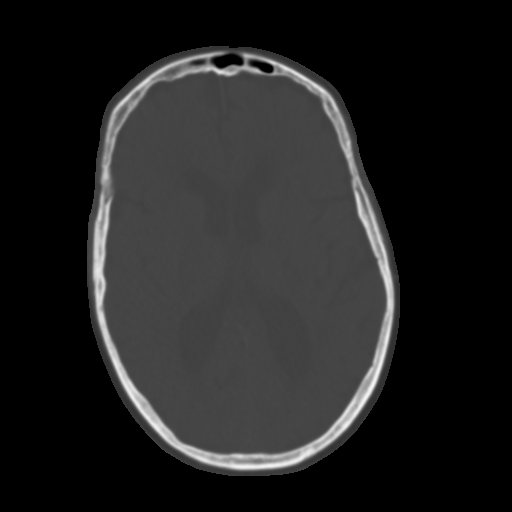
[im 21/34  brain]
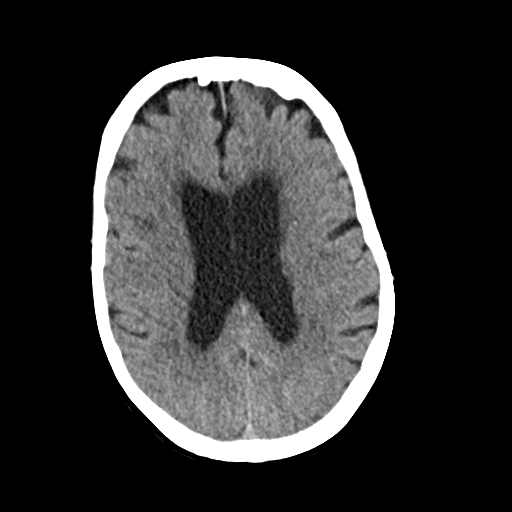
[im 24/34  brain]
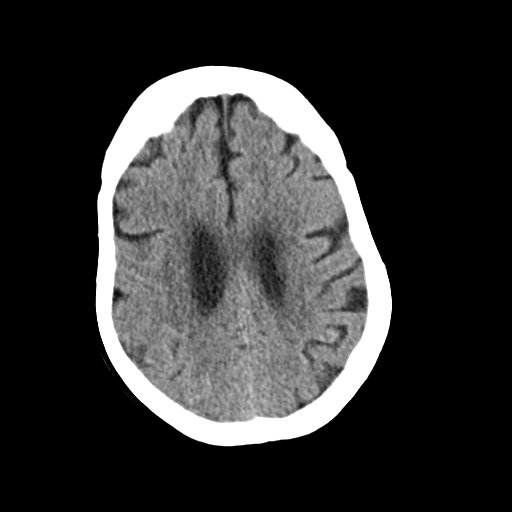
[im 28/34  brain]
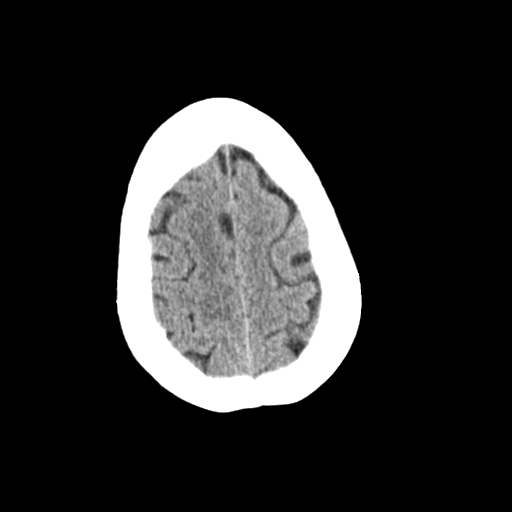
[im 31/34  brain]
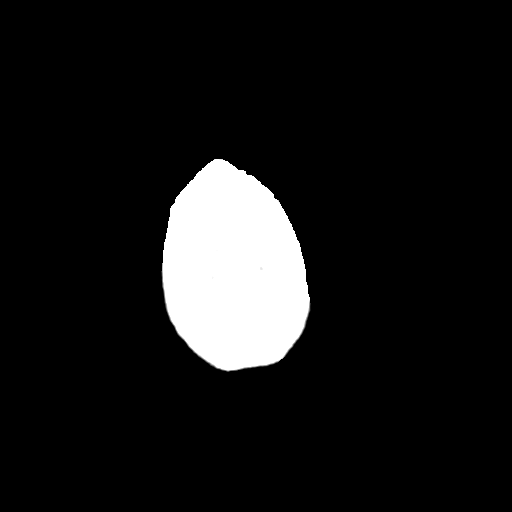
[im 31/34  bone]
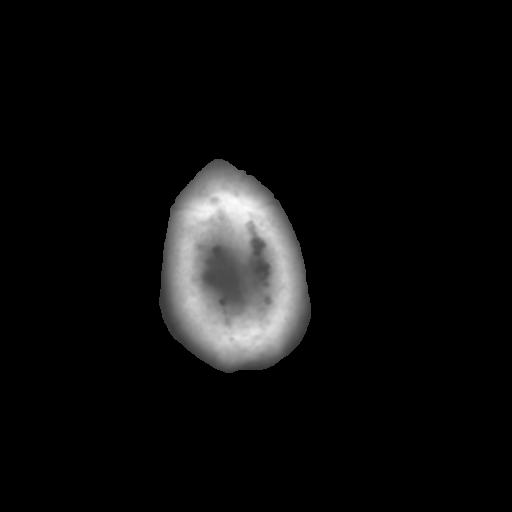

[Series 4: coronal soft · coronal · 0.35mm/px · 3 of 73 slices shown]
[im 25/73  brain]
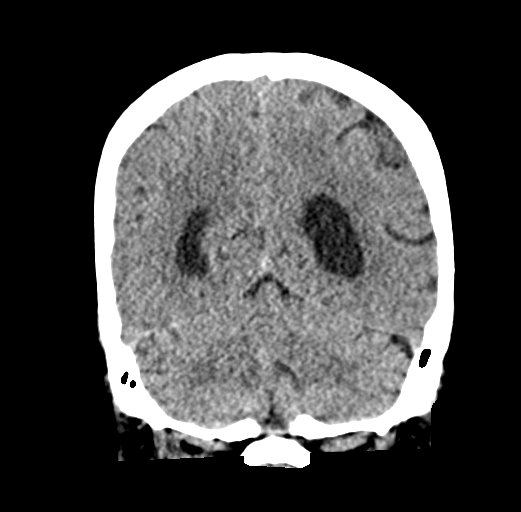
[im 33/73  brain]
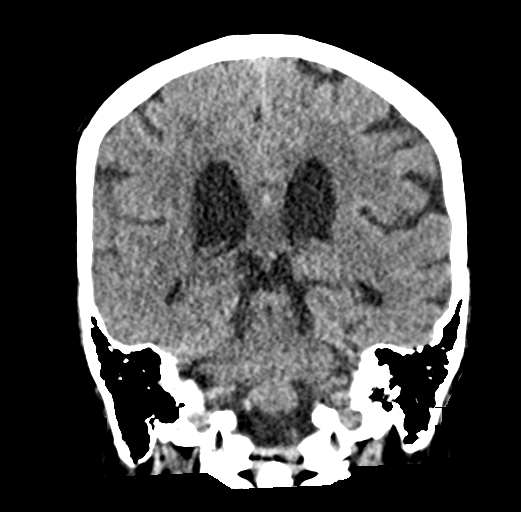
[im 41/73  brain]
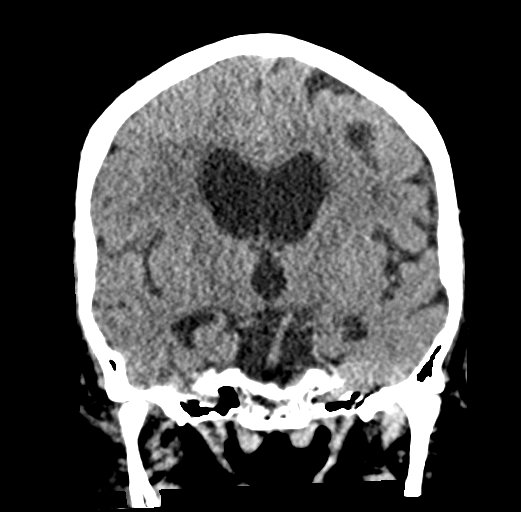

[Series 5: sag soft · sagittal · 0.34mm/px · 3 of 58 slices shown]
[im 20/58  brain]
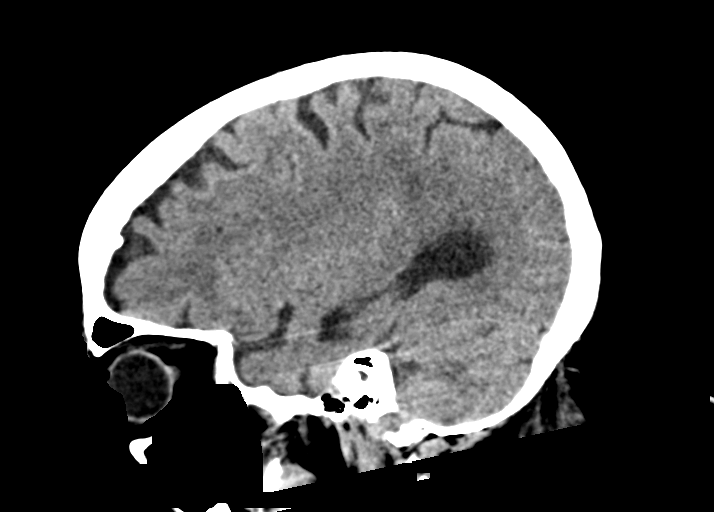
[im 29/58  brain]
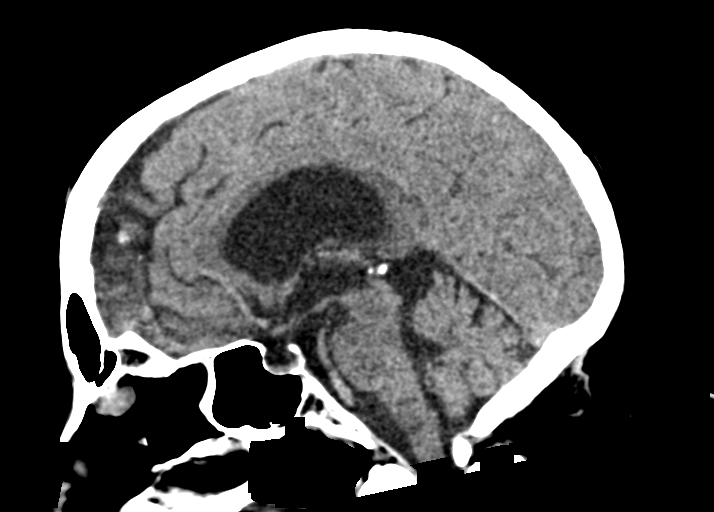
[im 39/58  brain]
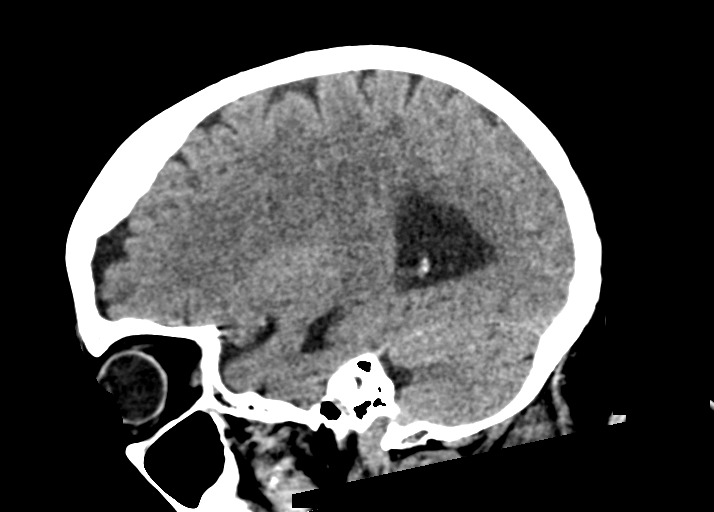

[15 of 47 positions shown; findings below may reference images not displayed]

FINDINGS: Brain: Normal anatomic configuration. Parenchymal volume loss is
commensurate with the patient's age. Mild periventricular white
matter changes are present likely reflecting the sequela of small
vessel ischemia. No abnormal intra or extra-axial mass lesion or
fluid collection. No abnormal mass effect or midline shift. No
evidence of acute intracranial hemorrhage or infarct. Ventricular
size is normal. Cerebellum unremarkable.

Vascular: No asymmetric hyperdense vasculature at the skull base.

Skull: Intact

Sinuses/Orbits: Paranasal sinuses are clear. Orbits are
unremarkable.

Other: Mastoid air cells and middle ear cavities are clear.
IMPRESSION: No acute intracranial abnormality.  Stable senescent change.

## 2021-02-23 MED ORDER — CARVEDILOL 12.5 MG PO TABS
12.5000 mg | ORAL_TABLET | Freq: Once | ORAL | Status: AC
  Administered 2021-02-23: 12.5 mg via ORAL
  Filled 2021-02-23: qty 1

## 2021-02-23 MED ORDER — CARVEDILOL 12.5 MG PO TABS
12.5000 mg | ORAL_TABLET | Freq: Two times a day (BID) | ORAL | 0 refills | Status: AC
Start: 2021-02-23 — End: ?

## 2021-02-23 MED ORDER — CARVEDILOL 6.25 MG PO TABS: 6.2500 mg | ORAL_TABLET | Freq: Once | ORAL | Status: DC

## 2021-02-23 NOTE — ED Provider Notes (Signed)
[facility] EMERGENCY DEPARTMENT Provider Note   CSN: [id number] Arrival date & time: 02/23/21  1634     History Chief Complaint  Patient presents with   Hypertension    Debra Morgan is a 75 y.o. female hx of HTN, here presenting with dizziness and hypertension.  Patient just saw ENT doctor several days ago.  Patient was told that she needs vestibular rehab and was put on prednisone.  Patient states that since then, her blood pressure has been more elevated.  It was 1 in the 160s and went up to 180 today.  Patient has persistent dizziness.  Patient is wheelchair-bound at baseline.  Patient denies any trouble speaking or worsening weakness.  Denies any chest pain or shortness of breath.  Patient takes Coreg 12.5 mg twice daily and metoprolol at baseline.  The history is provided by the patient.      Past Medical History:  Diagnosis Date   Dystonia    High cholesterol    Hypertension    Thyroid disease     Patient Active Problem List   Diagnosis Date Noted   Visual hallucinations 04/05/2019   Confusion 04/04/2019   Encephalopathy acute 04/04/2019   Generalized pain 04/04/2019   Right leg weakness 04/04/2019   Abdominal pain 04/04/2019   Bradycardia 04/04/2019   Acute encephalopathy 04/03/2019   Hypothyroidism 04/03/2019   Major depressive disorder, recurrent episode, moderate (HCC) 03/21/2019   Generalized weakness 03/16/2019   Thyroid disease    Dystonia    Pneumonia due to COVID-19 virus    Elevated transaminase level    Hypertension    Chest pain    COVID-19 virus infection     Past Surgical History:  Procedure Laterality Date   TUBAL LIGATION       OB History   No obstetric history on file.     History reviewed. No pertinent family history.  Social History   Tobacco Use   Smoking status: Never   Smokeless tobacco: Never  Substance Use Topics   Alcohol use: Yes    Comment: social drinker, infrequent   Drug use: Never    Home  Medications Prior to Admission medications   Medication Sig Start Date End Date Taking? Authorizing Provider  acetaminophen (TYLENOL) 500 MG tablet Take 1,000 mg by mouth every 6 (six) hours as needed for headache.     [provider]  ARIPiprazole (ABILIFY) 2 MG tablet Take 1 tablet (2 mg total) by mouth at bedtime. 04/12/19   Glade Lloyd, MD  benztropine (COGENTIN) 1 MG tablet Take 1 tablet (1 mg total) by mouth 2 (two) times daily. 04/12/19   Glade Lloyd, MD  carvedilol (COREG) 6.25 MG tablet Take 1 tablet (6.25 mg total) by mouth 2 (two) times daily. 03/26/19 04/25/19  Arrien, York Ram, MD  DULoxetine (CYMBALTA) 20 MG capsule Take 1 capsule (20 mg total) by mouth daily. 04/13/19   Glade Lloyd, MD  ezetimibe (ZETIA) 10 MG tablet Take 1 tablet (10 mg total) by mouth daily. 03/24/19 04/23/19  Arrien, York Ram, MD  guaiFENesin-dextromethorphan (ROBITUSSIN DM) 100-10 MG/5ML syrup Take 10 mLs by mouth every 4 (four) hours as needed for cough. 04/12/19   Glade Lloyd, MD  levothyroxine (SYNTHROID) 100 MCG tablet Take 1 tablet (100 mcg total) by mouth daily at 6 (six) AM. 04/13/19   Glade Lloyd, MD  Multiple Vitamin (MULTIVITAMIN WITH MINERALS) TABS tablet Take 1 tablet by mouth daily.    [provider]  Multiple Vitamins-Minerals (AIRBORNE) TBEF  Take 1 tablet by mouth daily as needed (immune system boost).    [provider]  ondansetron (ZOFRAN) 4 MG tablet Take 1 tablet (4 mg total) by mouth every 6 (six) hours as needed for nausea. Patient not taking: Reported on 04/14/2019 04/12/19   Glade Lloyd, MD  traMADol (ULTRAM) 50 MG tablet Take 1 tablet (50 mg total) by mouth every 6 (six) hours as needed. 04/14/19   Gerhard Munch, MD    Allergies    Sinemet Aggie Cosier w-levodopa], Codeine, and Tetracyclines & related  Review of Systems   Review of Systems  Neurological:  Positive for dizziness.  All other systems reviewed and are  negative.  Physical Exam Updated Vital Signs BP (!) 177/101   Pulse 69   Temp 98.3 F (36.8 C) (Oral)   Resp 18   Ht 5\' 4"  (1.626 m)   Wt 74.4 kg   SpO2 96%   BMI 28.15 kg/m   Physical Exam Vitals and nursing note reviewed.  HENT:     Head: Normocephalic.     Nose: Nose normal.     Mouth/Throat:     Mouth: Mucous membranes are moist.  Eyes:     Extraocular Movements: Extraocular movements intact.     Pupils: Pupils are equal, round, and reactive to light.  Cardiovascular:     Rate and Rhythm: Normal rate and regular rhythm.     Pulses: Normal pulses.     Heart sounds: Normal heart sounds.  Pulmonary:     Effort: Pulmonary effort is normal.     Breath sounds: Normal breath sounds.  Abdominal:     General: Abdomen is flat.     Palpations: Abdomen is soft.  Musculoskeletal:        General: Normal range of motion.     Cervical back: Normal range of motion and neck supple.  Skin:    General: Skin is warm.     Capillary Refill: Capillary refill takes less than 2 seconds.  Neurological:     General: No focal deficit present.     Mental Status: She is oriented to person, place, and time.     Comments: Cranial nerves II to XII intact.  Patient is not ambulatory at baseline.  Normal strength bilateral arms.  Patient's leg strength is 3 out of 5 bilateral legs.  Psychiatric:        Mood and Affect: Mood normal.        Behavior: Behavior normal.    ED Results / Procedures / Treatments   Labs (all labs ordered are listed, but only abnormal results are displayed) Labs Reviewed  BASIC METABOLIC PANEL - Abnormal; Notable for the following components:      Result Value   Glucose, Bld 138 (*)    All other components within normal limits  CBC WITH DIFFERENTIAL/PLATELET    EKG EKG Interpretation  Date/Time:  Saturday February 23 2021 17:56:41 EDT Ventricular Rate:  63 PR Interval:  56 QRS Duration: 108 QT Interval:  402 QTC Calculation: 412 R Axis:   -2 Text  Interpretation: Sinus rhythm Short PR interval Abnormal R-wave progression, early transition No significant change since last tracing Confirmed by [id number] [phone]) on 02/23/2021 6:42:59 PM  Radiology CT Head Wo Contrast  Result Date: 02/23/2021 CLINICAL DATA:  Dizziness, non-specific.  Hypertension. EXAM: CT HEAD WITHOUT CONTRAST TECHNIQUE: Contiguous axial images were obtained from the base of the skull through the vertex without intravenous contrast. COMPARISON:  01/25/2021, MRI 04/04/2019 FINDINGS: Brain:  Normal anatomic configuration. Parenchymal volume loss is commensurate with the patient's age. Mild periventricular white matter changes are present likely reflecting the sequela of small vessel ischemia. No abnormal intra or extra-axial mass lesion or fluid collection. No abnormal mass effect or midline shift. No evidence of acute intracranial hemorrhage or infarct. Ventricular size is normal. Cerebellum unremarkable. Vascular: No asymmetric hyperdense vasculature at the skull base. Skull: Intact Sinuses/Orbits: Paranasal sinuses are clear. Orbits are unremarkable. Other: Mastoid air cells and middle ear cavities are clear. IMPRESSION: No acute intracranial abnormality.  Stable senescent change. Electronically Signed   By: Helyn Numbers M.D.   On: 02/23/2021 19:25    Procedures Procedures   Medications Ordered in ED Medications  carvedilol (COREG) tablet 12.5 mg (12.5 mg Oral Given 02/23/21 1923)    ED Course  I have reviewed the triage vital signs and the nursing notes.  Pertinent labs & imaging results that were available during my care of the patient were reviewed by me and considered in my medical decision making (see chart for details).    MDM Rules/Calculators/A&P                           Alichia Alridge is a 74 y.o. female here presenting with hypertension and persistent dizziness.  Dizziness has been going on for last several months. Patient was put on prednisone and likely  increase her blood pressure.  I have low suspicion for posterior circulation stroke.  If CT head did not show a bleed, we will not need MRI. Will increase her Coreg to 25 mg twice daily.  We will check labs  7:38 PM Labs and CT head unremarkable. I think her hypotension likely secondary to steroid use.  Will increase Coreg to 25 mg twice daily for now.  We will have her check her blood pressure at home and follow-up with PCP in a week  Final Clinical Impression(s) / ED Diagnoses Final diagnoses:  None    Rx / DC Orders ED Discharge Orders     None        Charlynne Pander, MD 02/23/21 1942

## 2021-02-23 NOTE — Discharge Instructions (Signed)
Your blood pressure is elevated likely from side effect from prednisone  Please increase your Coreg to 12.5 twice daily.  Monitor your blood pressure closely.  If you are blood pressure goes below 140 then you may need to decrease to your previous dose of 6.25 mg twice daily   See your doctor in a week for follow-up  Return to ER if you have worse dizziness, blood pressure over 200, headache, trouble speaking, chest pain

## 2021-02-23 NOTE — ED Triage Notes (Signed)
Hypertension systolic 180 at home, currently on blood pressure medication and taking prednisone currently for inner ear trouble.  Pt states "I feel weird".  Denies HA

## 2021-02-24 ENCOUNTER — Telehealth (HOSPITAL_COMMUNITY): Payer: Self-pay

## 2024-02-03 NOTE — Progress Notes (Signed)
 Subjective Patient ID: Debra Morgan is a 78 y.o. female.  Chief Complaint  Patient presents with  . Referral  . Medicare Annual Wellness Visit Subsequent    The following information was reviewed by members of the visit team:  Tobacco  Allergies  Meds  Problems  Med Hx  Surg Hx  Fam Hx      HPI Debra Morgan is a 78 y.o. female here today for physical therapy referral and chronic management for:  1. Corticobasal degeneration   2. Encounter for Medicare annual wellness exam   3. Dystonia   4. Major neurocognitive disorder (HCC)   5. Essential hypertension   6. Recurrent UTI   7. Lumbar radiculopathy   8. Acquired hypothyroidism   9. Restrictive lung disease   10. Aortic atherosclerosis (HCC)   11. PSVT (paroxysmal supraventricular tachycardia)   12. Other secondary parkinsonism (HCC)   13. Major depressive disorder, recurrent episode, moderate (HCC)   14. Mixed hyperlipidemia   15. Unsteady gait      She denies any concerns today except for increased dystonia to right arm and leg. Hand now with contracture.  She reports taking medications as prescribed.   The following portions of the patient's history were reviewed and updated as appropriate: allergies, current medications, past social history and problem list.   Review of Systems  Constitutional:  Negative for activity change, appetite change, chills, diaphoresis, fatigue, fever and unexpected weight change.  HENT:  Negative for congestion, dental problem, drooling, ear discharge, ear pain, facial swelling, hearing loss, mouth sores, nosebleeds, postnasal drip, rhinorrhea, sinus pressure, sinus pain, sneezing, sore throat, tinnitus, trouble swallowing and voice change.   Eyes:  Negative for photophobia, pain, discharge, redness, itching and visual disturbance.  Respiratory:  Negative for apnea, cough, choking, chest tightness, shortness of breath, wheezing and stridor.   Cardiovascular:  Negative for  chest pain, palpitations and leg swelling.  Gastrointestinal:  Negative for abdominal distention, abdominal pain, anal bleeding, blood in stool, constipation, diarrhea, nausea, rectal pain and vomiting.  Endocrine: Negative for cold intolerance, heat intolerance, polydipsia, polyphagia and polyuria.  Genitourinary:  Negative for decreased urine volume, difficulty urinating, dyspareunia, dysuria, enuresis, flank pain, frequency, genital sores, hematuria, menstrual problem, pelvic pain, urgency, vaginal bleeding, vaginal discharge and vaginal pain.  Musculoskeletal:  Negative for arthralgias, back pain, gait problem, joint swelling, myalgias and neck stiffness.  Skin:  Negative for color change, pallor, rash and wound.  Allergic/Immunologic: Negative for environmental allergies, food allergies and immunocompromised state.  Neurological:  Negative for dizziness, tremors, seizures, syncope, facial asymmetry, speech difficulty, weakness, light-headedness, numbness and headaches.  Hematological:  Negative for adenopathy. Does not bruise/bleed easily.  Psychiatric/Behavioral:  Negative for agitation, behavioral problems, confusion, decreased concentration, dysphoric mood, hallucinations, self-injury, sleep disturbance and suicidal ideas. The patient is not nervous/anxious and is not hyperactive.     Objective Physical Exam Vitals and nursing note reviewed. Exam conducted with a chaperone present.  Constitutional:      Appearance: Normal appearance.     Comments: In wheelchair  HENT:     Head: Normocephalic and atraumatic.     Right Ear: External ear normal.     Left Ear: External ear normal.     Nose: Nose normal.  Eyes:     General: No scleral icterus.       Right eye: No discharge.        Left eye: No discharge.     Extraocular Movements: Extraocular movements intact.  Conjunctiva/sclera: Conjunctivae normal.     Pupils: Pupils are equal, round, and reactive to light.  Cardiovascular:      Rate and Rhythm: Normal rate and regular rhythm.     Heart sounds: Normal heart sounds. No murmur heard.    No friction rub. No gallop.  Pulmonary:     Effort: Pulmonary effort is normal. No respiratory distress.     Breath sounds: Normal breath sounds. No stridor. No wheezing, rhonchi or rales.  Chest:     Chest wall: No tenderness.  Musculoskeletal:     Cervical back: Normal range of motion and neck supple.     Comments: Dystonia right arm and leg. 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 Contracture of right hand  Skin:    General: Skin is warm and dry.  Neurological:     General: No focal deficit present.     Mental Status: She is alert and oriented to person, place, and time.  Psychiatric:        Mood and Affect: Mood normal.        Behavior: Behavior normal.        Thought Content: Thought content normal.        Judgment: Judgment normal.     Assessment/Plan Diagnoses and all orders for this visit:  Corticobasal degeneration Stable. PLAN: continue current regimen.   Encounter for Medicare annual wellness exam Stable. PLAN: continue current regimen.   Dystonia Discussed PLAN: See referred clinic for further evaluation  -     Ambulatory referral to Home Health; Future  Major neurocognitive disorder (HCC) Stable. PLAN: continue current regimen.   Essential hypertension Check labs. PLAN: continue current regimen and follow-up based on results. -     TSH; Future -     CBC with Differential; Future -     Comprehensive Metabolic Panel; Future -     Vitamin D, 25-Hydroxy; Future -     Urinalysis with Microscopic; Future -     Urine Culture; Future  Recurrent UTI Check labs. PLAN: continue current regimen and follow-up based on results. -     Urinalysis with Microscopic; Future -     Urine Culture; Future  Lumbar radiculopathy Stable. PLAN: continue current regimen.   Acquired hypothyroidism Check labs. PLAN: continue current regimen and follow-up based on  results. -     TSH; Future  Restrictive lung disease Stable. PLAN: continue current regimen.   Aortic atherosclerosis (HCC) Stable. PLAN: continue current regimen.   PSVT (paroxysmal supraventricular tachycardia) Stable. PLAN: continue current regimen.   Other secondary parkinsonism (HCC) Stable. PLAN: continue current regimen.   Major depressive disorder, recurrent episode, moderate (HCC) Stable. PLAN: continue current regimen.   Mixed hyperlipidemia Stable. PLAN: continue current regimen.   Unsteady gait Stable. PLAN: continue current regimen.     Plan  See above. She will return every 4-6 months for management of her chronic medical issues.   This document serves as a record of services personally performed by Dr. Deane.  It was created on their behalf by Garrie Chuck Muscat, CMA, a trained medical scribe, and Scientist, forensic (CMA). During the course of documenting the history, physical exam and medical decision making, I was functioning as a Stage manager. The creation of this record is the provider's dictation and/or activities during the visit.   Electronically signed: Garrie Chuck Seegars, CMA 02/03/2024  11:46 AM  [facility]  [facility]    Name: Debra Morgan Date of Birth:  20-May-1945 Age: 78 y.o. MRN: [id number] Visit Date: 02/03/2024  History obtained from: patient  Living Arrangements/Support System/Health Assessment/Pain/Stress Marital status: married Number of children: 1 Occupation: [employer] Living arrangements: lives with spouse/significant other Does the patient have a support system (family, friend, church, Conservation officer, nature, etc)?: Yes   Do you have any dental concerns?: No In the past month, have you experienced a change in your bladder control?: No   Do you have any difficulty obtaining your medications?: No   Do you have trouble consistently taking or remembering to take all of  your medications as prescribed?: No Patient rates overall stress level as: None Does stress affect daily life?: No Typical amount of pain: some Does pain affect daily life?: No Are you currently prescribed opioids?: No                Depression Screening  Behavioral Health Screening  Patient Health Questionnaire-2 Score: 0 (02/03/2024 11:08 AM)      Patient's Depression screening/score = Negative    Depression Plan: Normal/Negative Screening    Social History (Tobacco/Drugs/Sexual Activity) Debra Morgan reports that she has never smoked. She has never used smokeless tobacco. Tobacco Use?: No How many times in the past year have you used a recreational drug or used a prescription medication for nonmedical reasons?: None Risk factors for sexually transmitted infections (i.e., multiple sexual partners): No Are you bothered by sexual problems?: No  Alcohol Screening How often do you have a drink containing alcohol?: Never How many standard drinks containing alcohol do you have on a typical day?: Never, 1 or 2 drinks How often do you have six or more drinks on one occasion?: Never Audit-C Score: 0  Physical Activity Regular exercise?: (!) No      Diet How many meals a day?: 3 Eats fruit and vegetables daily?: Yes Most meals are obtained by: having others provide food, eating out  Home and Transportation Safety All rugs have non-skid backing?: Yes All stairs or steps have railings?: N/A, no stairs Grab bars in the bathtub or shower?: Yes Have non-skid surface in bathtub or shower?: Yes Good home lighting?: Yes Regular seat belt use?: Yes      Activities of Daily Living Feed self?: (!) No Bathe self?: (!) No Dress self?: (!) No Use toilet without assistance?: (!) No Walk without assistance?: (!) No, needs assistance from Walking assistance provided by: wheelchair  Instrumental Activities of Daily Living Manage finances?: (!) No Shop for themselves?: (!)  No Prepare meals?: (!) No Use the telephone?: Yes Manage medications?: (!) No Medications managed by: Todd- husband Performs basic housework/laundry?: (!) No Drives?: No Primary transportation is: family or friends  Hearing Concerns about hearing?: No Uses hearing aids?: No Hear whispered voice? (Observed): Yes  Vision Concerns about vision?: No Vision exam performed?: (!) No  Fall Risk Is the patient ambulatory?: Yes One or more falls in the last year:: No Feels unsteady when walking:: No  Cognitive Assessment Has a diagnosis of dementia or cognitive impairment?: No Are there any memory concerns by the patient, others, or providers?: No              Advance Directives Living will?: Yes   Healthcare POA?: Yes Name of Health Care Agent: Helon Wisinski Relationship to patient: Spouse Health Care Agent's phone number: [phone]         Other History I reviewed and updated the following risk factors and conditions as appropriate:        Vital Signs BP 100/61 (  BP Location: Left arm, Patient Position: Sitting)   Pulse 61   Resp 18   SpO2 94%   Screening and Immunizations Health Maintenance Status       Date Due Completion Dates   Hepatitis C Screening Never done ---   DTaP/Tdap/Td Vaccines (1 - Tdap) Never done ---   ZOSTER VACCINE (1 of 2) Never done ---   Adult RSV (60+ Years or Pregnancy) (1 - 1-dose 75+ series) Never done ---   Pneumococcal Vaccine for Ages 59+ (2 of 2 - PCV20 or PCV21) 01/15/2021 01/16/2020   COVID-19 Vaccine (1 - 2024-25 season) Never done ---   Influenza Vaccine (1) 12/18/2023 ---   Diabetes Screening 03/18/2024 03/19/2023, 02/06/2023   Depression Monitoring 05/11/2024 11/10/2023   Bone Density Scan (Ongoing) 10/25/2024 10/26/2019   Comprehensive Annual Visit 02/02/2025 02/03/2024, 02/26/2022   Medicare Annual Wellness (AWV) Subsequent Visits 02/02/2025 02/03/2024, 02/26/2022       Immunization History  Administered Date(s)  Administered  . Pneumococcal Conjugate 13-Valent 01/16/2020    Assessment/Plan:  : The topics above were reviewed with the patient.  Healthy lifestyle principles reviewed.  Recommendations provided when indicated.  Follow up 1 year for next wellness visit.  Orders Placed This Encounter  Procedures  . Urine Culture  . TSH  . CBC with Differential  . Comprehensive Metabolic Panel  . Vitamin D, 25-Hydroxy  . Urinalysis with Microscopic  . Ambulatory referral to Home Health    No orders of the defined types were placed in this encounter.   Patient Care Team: Camie CHRISTELLA Cools, MD as PCP - General Harsha Nagaraja, MD as PCP - Neurology (Continuity Provider) (Neurology) Camie CHRISTELLA Cools, MD as PCP - Attributed  Electronically signed by: Garrie Parody Seegars, CMA 02/03/2024 11:46 AM  I agree the documentation is accurate and complete.  Electronically signed by: Camie CHRISTELLA Cools, MD 02/03/2024 12:18 PM
